# Patient Record
Sex: Female | Born: 1945 | Race: White | Hispanic: No | Marital: Married | Smoking: Never smoker
Health system: Southern US, Community
[De-identification: ages and names within clinical notes are randomized; demographics above are authoritative.]

## PROBLEM LIST (undated history)

## (undated) DIAGNOSIS — K219 Gastro-esophageal reflux disease without esophagitis: Secondary | ICD-10-CM

## (undated) DIAGNOSIS — Z8719 Personal history of other diseases of the digestive system: Secondary | ICD-10-CM

## (undated) DIAGNOSIS — I10 Essential (primary) hypertension: Secondary | ICD-10-CM

## (undated) DIAGNOSIS — E7800 Pure hypercholesterolemia, unspecified: Secondary | ICD-10-CM

## (undated) DIAGNOSIS — M1990 Unspecified osteoarthritis, unspecified site: Secondary | ICD-10-CM

## (undated) HISTORY — DX: Pure hypercholesterolemia, unspecified: E78.00

## (undated) HISTORY — DX: Gastro-esophageal reflux disease without esophagitis: K21.9

## (undated) HISTORY — PX: ESOPHAGOGASTRODUODENOSCOPY ENDOSCOPY: SHX5814

## (undated) HISTORY — DX: Essential (primary) hypertension: I10

## (undated) HISTORY — DX: Unspecified osteoarthritis, unspecified site: M19.90

---

## 1967-06-27 HISTORY — PX: APPENDECTOMY: SHX54

## 1967-06-27 HISTORY — PX: TUBAL LIGATION: SHX77

## 1968-06-26 HISTORY — PX: TONSILECTOMY/ADENOIDECTOMY WITH MYRINGOTOMY: SHX6125

## 1985-06-26 HISTORY — PX: VAGINAL HYSTERECTOMY: SUR661

## 2004-10-19 ENCOUNTER — Ambulatory Visit: Payer: Self-pay | Admitting: Internal Medicine

## 2004-10-28 ENCOUNTER — Ambulatory Visit: Payer: Self-pay

## 2005-10-23 ENCOUNTER — Ambulatory Visit: Payer: Self-pay | Admitting: Internal Medicine

## 2006-11-06 ENCOUNTER — Ambulatory Visit: Payer: Self-pay | Admitting: Internal Medicine

## 2006-11-16 ENCOUNTER — Ambulatory Visit: Payer: Self-pay | Admitting: Internal Medicine

## 2007-11-08 ENCOUNTER — Ambulatory Visit: Payer: Self-pay | Admitting: Internal Medicine

## 2008-04-30 ENCOUNTER — Ambulatory Visit: Payer: Self-pay | Admitting: Gastroenterology

## 2008-11-13 ENCOUNTER — Ambulatory Visit: Payer: Self-pay | Admitting: Internal Medicine

## 2009-03-07 ENCOUNTER — Emergency Department: Payer: Self-pay | Admitting: Internal Medicine

## 2010-06-01 ENCOUNTER — Ambulatory Visit: Payer: Self-pay | Admitting: Internal Medicine

## 2011-05-19 ENCOUNTER — Ambulatory Visit: Payer: Self-pay | Admitting: Internal Medicine

## 2011-08-03 ENCOUNTER — Ambulatory Visit: Payer: Self-pay | Admitting: Internal Medicine

## 2012-05-06 ENCOUNTER — Encounter: Payer: Self-pay | Admitting: Internal Medicine

## 2012-05-06 ENCOUNTER — Ambulatory Visit (INDEPENDENT_AMBULATORY_CARE_PROVIDER_SITE_OTHER): Payer: Medicare Other | Admitting: Internal Medicine

## 2012-05-06 VITALS — BP 144/80 | HR 100 | Temp 98.4°F | Ht 66.0 in | Wt 167.0 lb

## 2012-05-06 DIAGNOSIS — R5383 Other fatigue: Secondary | ICD-10-CM

## 2012-05-06 DIAGNOSIS — M79603 Pain in arm, unspecified: Secondary | ICD-10-CM

## 2012-05-06 DIAGNOSIS — I10 Essential (primary) hypertension: Secondary | ICD-10-CM

## 2012-05-06 DIAGNOSIS — E7800 Pure hypercholesterolemia, unspecified: Secondary | ICD-10-CM

## 2012-05-06 DIAGNOSIS — R5381 Other malaise: Secondary | ICD-10-CM

## 2012-05-06 DIAGNOSIS — M8580 Other specified disorders of bone density and structure, unspecified site: Secondary | ICD-10-CM

## 2012-05-06 DIAGNOSIS — M810 Age-related osteoporosis without current pathological fracture: Secondary | ICD-10-CM | POA: Insufficient documentation

## 2012-05-06 DIAGNOSIS — M949 Disorder of cartilage, unspecified: Secondary | ICD-10-CM

## 2012-05-06 DIAGNOSIS — M79609 Pain in unspecified limb: Secondary | ICD-10-CM

## 2012-05-06 DIAGNOSIS — Z23 Encounter for immunization: Secondary | ICD-10-CM

## 2012-05-06 NOTE — Assessment & Plan Note (Signed)
Blood pressure had been under good control.  Elevated today - in the left arm.  Her outside checks have been under good control.  Have her spot check her pressure and get her back in soon to reassess.  Check metabolic panel with next labs.

## 2012-05-06 NOTE — Assessment & Plan Note (Signed)
Low cholesterol diet and exercise.  Check lipid panel with next labs.  

## 2012-05-06 NOTE — Progress Notes (Addendum)
  Subjective:    Patient ID: Whitney Carr, female    DOB: 06-Jun-1946, 66 y.o.   MRN: [id number]  HPI 66 year old female with past history of hypertension and hypercholesterolemia who comes in today for a scheduled follow up.  She stopped her HCTZ approximately 6 weeks ago.  Dizzy spells resolved.  Blood pressure has been averaging 130s/80.  Denies any chest pain or tightness.  Breathing is stable.  Eating and drinking well.  No acid reflux.    Past Medical History  Diagnosis Date  . Hypercholesterolemia   . Hypertension   . GERD (gastroesophageal reflux disease)   . Degenerative joint disease     Review of Systems Patient denies any headache, lightheadedness or dizziness.  No sinus or allergy symptoms reported. No chest pain, tightness or palpitations.  No increased shortness of breath, cough or congestion.  No acid reflux.  No nausea or vomiting.  No abdominal pain or cramping.  No bowel change, such as diarrhea, constipation, BRBPR or melana.  No urine change.   After checking her blood pressure and finding a variation in her arms, she did report noticing some intermittent pain in her left upper arm.       Objective:   Physical Exam Filed Vitals:   05/06/12 1440  BP: 144/80  Pulse: 100  Temp: 98.4 F (36.9 C)   Blood pressure:  122/78 left arm and 152/78 right arm  66 year old female in no acute distress.   HEENT:  Nares - clear.  OP- without lesions or erythema.  NECK:  Supple, nontender.     HEART:  Appears to be regular. LUNGS:  Without crackles or wheezing audible.  Respirations even and unlabored.   RADIAL PULSE:  Delayed and minimally diminished on the left compared to the right.   ABDOMEN:  Soft, nontender.  No audible abdominal bruit.   EXTREMITIES:  No increased edema to be present.                     Assessment & Plan:  VARYING PRESSURES.  Blood pressure as outlined in each arm.  Will refer to vascular surgery for evaluation and treatment.  Pt reports noticing  intermittent discomfort in her left upper arm.    CARDIOVASCULAR.  Currently asymptomatic.  Continue risk factor modification.    PREVIOUS CAROTID BRUIT.  Carotid ultrasound 05/19/11 revealed no hemodynamically significant stenosis.    HEALTH MAINTENANCE.  Physical 11/29/11.  Is s/p hysterectomy and does not require yearly pap smears.  Colonoscopy 11/09 revealed small polyp that was removed.  Recommended follow up colonoscopy 5 years.  Pneumovax given today.

## 2012-05-06 NOTE — Patient Instructions (Signed)
It was nice seeing you today.  I am glad you have been doing well.  Monitor your blood pressures and let me know if problems.

## 2012-05-06 NOTE — Assessment & Plan Note (Signed)
Bone density 05/27/10 revealed osteopenia.  No significant change.  Continue calcium and vitamin D and weight bearing exercise.    

## 2012-05-08 MED ORDER — PNEUMOCOCCAL VAC POLYVALENT 25 MCG/0.5ML IJ INJ
0.5000 mL | INJECTION | Freq: Once | INTRAMUSCULAR | Status: AC
  Administered 2012-05-08: 0.5 mL via INTRAMUSCULAR

## 2012-05-08 NOTE — Addendum Note (Signed)
Addended by: Marlene Lard on: 05/08/2012 09:43 AM   Modules accepted: Orders

## 2012-05-13 ENCOUNTER — Other Ambulatory Visit (INDEPENDENT_AMBULATORY_CARE_PROVIDER_SITE_OTHER): Payer: Medicare Other

## 2012-05-13 DIAGNOSIS — R5381 Other malaise: Secondary | ICD-10-CM

## 2012-05-13 DIAGNOSIS — R5383 Other fatigue: Secondary | ICD-10-CM

## 2012-05-13 DIAGNOSIS — E7800 Pure hypercholesterolemia, unspecified: Secondary | ICD-10-CM

## 2012-05-13 LAB — COMPREHENSIVE METABOLIC PANEL
AST: 18 U/L (ref 0–37)
Albumin: 4.4 g/dL (ref 3.5–5.2)
Alkaline Phosphatase: 50 U/L (ref 39–117)
Calcium: 9.2 mg/dL (ref 8.4–10.5)
Chloride: 104 mEq/L (ref 96–112)
Glucose, Bld: 93 mg/dL (ref 70–99)
Potassium: 3.7 mEq/L (ref 3.5–5.1)
Sodium: 137 mEq/L (ref 135–145)
Total Protein: 7.8 g/dL (ref 6.0–8.3)

## 2012-05-13 LAB — LIPID PANEL
Cholesterol: 223 mg/dL — ABNORMAL HIGH (ref 0–200)
HDL: 50.4 mg/dL (ref >39.00)
Total CHOL/HDL Ratio: 4
Triglycerides: 118 mg/dL (ref 0.0–149.0)

## 2012-05-13 LAB — CBC WITH DIFFERENTIAL/PLATELET
Eosinophils Relative: 0.7 % (ref 0.0–5.0)
Lymphocytes Relative: 22.1 % (ref 12.0–46.0)
MCV: 90.8 fl (ref 78.0–100.0)
Monocytes Absolute: 0.5 10*3/uL (ref 0.1–1.0)
Neutrophils Relative %: 71.2 % (ref 43.0–77.0)
Platelets: 302 10*3/uL (ref 150.0–400.0)
RBC: 4.75 Mil/uL (ref 3.87–5.11)
WBC: 8.2 10*3/uL (ref 4.5–10.5)

## 2012-05-14 ENCOUNTER — Telehealth: Payer: Self-pay | Admitting: Internal Medicine

## 2012-05-14 NOTE — Telephone Encounter (Signed)
Opened by mistake.

## 2012-05-31 ENCOUNTER — Encounter: Payer: Self-pay | Admitting: *Deleted

## 2012-05-31 DIAGNOSIS — Z86010 Personal history of colonic polyps: Secondary | ICD-10-CM

## 2012-06-03 ENCOUNTER — Ambulatory Visit (INDEPENDENT_AMBULATORY_CARE_PROVIDER_SITE_OTHER): Payer: Medicare Other | Admitting: Internal Medicine

## 2012-06-03 ENCOUNTER — Encounter: Payer: Self-pay | Admitting: Internal Medicine

## 2012-06-03 VITALS — BP 162/76 | HR 85 | Temp 98.2°F | Ht 66.0 in | Wt 168.5 lb

## 2012-06-03 DIAGNOSIS — I10 Essential (primary) hypertension: Secondary | ICD-10-CM

## 2012-06-03 DIAGNOSIS — M2550 Pain in unspecified joint: Secondary | ICD-10-CM

## 2012-06-03 DIAGNOSIS — M2560 Stiffness of unspecified joint, not elsewhere classified: Secondary | ICD-10-CM

## 2012-06-03 MED ORDER — LISINOPRIL 10 MG PO TABS: 10.0000 mg | ORAL_TABLET | Freq: Every day | ORAL | Status: DC

## 2012-06-03 NOTE — Progress Notes (Signed)
  Subjective:    Patient ID: Turner Kunzman, female    DOB: 1946/03/26, 66 y.o.   MRN: [id number]  HPI 66 year old female with past history of hypertension and hypercholesterolemia who comes in today for a scheduled follow up.  Here to follow up regarding her blood pressure.  She has been off the HCTZ for approximately 2 months.  Blood pressure remaining in the 140s/60-70.  Denies any chest pain or tightness.  Breathing is stable.  Eating and drinking well.  No acid reflux.    Past Medical History  Diagnosis Date  . Hypercholesterolemia   . Hypertension   . GERD (gastroesophageal reflux disease)   . Degenerative joint disease     Review of Systems Patient denies any headache, lightheadedness or dizziness.  No sinus or allergy symptoms reported. No chest pain, tightness or palpitations.  No increased shortness of breath, cough or congestion.  No acid reflux.  No nausea or vomiting.  No abdominal pain or cramping.  No bowel change, such as diarrhea, constipation, BRBPR or melana.  No urine change.   She did see Dr Gilda Crease for the varying pressures in each arm.  Is planning for further vascular evaluation at the end of the month.  She does report increased joint aches and pains.  Feels stiff when she first gets up and if she sits for a while.  Once she starts moving around - feels better.       Objective:   Physical Exam  Filed Vitals:   06/03/12 1603  BP: 162/76  Pulse: 85  Temp: 98.2 F (36.8 C)   Blood pressure recheck:  23/58  66 year old female in no acute distress.   HEENT:  Nares - clear.  OP- without lesions or erythema.  NECK:  Supple, nontender.  Right carotid bruit.   HEART:  Appears to be regular. LUNGS:  Without crackles or wheezing audible.  Respirations even and unlabored.   RADIAL PULSE:  Delayed and minimally diminished on the left compared to the right.   ABDOMEN:  Soft, nontender.  No audible abdominal bruit.   EXTREMITIES:  No increased edema to be present.                      Assessment & Plan:  VARYING PRESSURES.  Blood pressures varying in each arm.  Saw vascular surgery for evaluation. Planning to have further vascular evaluation at the end of the month.  Does have the carotid bruit.  Pursue further vascular evaluation as outlined.      CARDIOVASCULAR.  Currently asymptomatic.  Continue risk factor modification.    CAROTID BRUIT.  Carotid ultrasound 05/19/11 revealed no hemodynamically significant stenosis.   JOINT PAINS.  See above.  Will check rheum panel with next labs.    HEALTH MAINTENANCE.  Physical 11/29/11.  Is s/p hysterectomy and does not require yearly pap smears.  Colonoscopy 11/09 revealed small polyp that was removed.  Recommended follow up colonoscopy 5 years.  Pneumovax given last visit.

## 2012-06-03 NOTE — Patient Instructions (Addendum)
It was nice seeing you today.  I am going to start you on Lisinopril 10mg  - one per day.  Spot check your blood pressure.  Let me know if any problems.

## 2012-06-03 NOTE — Assessment & Plan Note (Signed)
Blood pressure elevated as outlined.  Will start Lisinopril 10mg  q day.  Check met b in 10-14 days.  Get her back in soon to reassess.  Have her spot check her pressures.  Let me know if remains elevated.

## 2012-06-23 ENCOUNTER — Telehealth: Payer: Self-pay | Admitting: Internal Medicine

## 2012-06-23 NOTE — Telephone Encounter (Signed)
I reviewed labs from [facility].  Inflammatory markers normal.  Metabolic panel wnl.  Labs ok.

## 2012-06-24 NOTE — Telephone Encounter (Signed)
Left detailed message per patient

## 2012-07-02 ENCOUNTER — Ambulatory Visit: Payer: Self-pay | Admitting: Vascular Surgery

## 2012-07-02 LAB — BUN: BUN: 18 mg/dL (ref 7–18)

## 2012-07-02 LAB — CREATININE, SERUM: Creatinine: 0.61 mg/dL (ref 0.60–1.30)

## 2012-07-12 ENCOUNTER — Encounter: Payer: Self-pay | Admitting: Internal Medicine

## 2012-07-12 ENCOUNTER — Ambulatory Visit (INDEPENDENT_AMBULATORY_CARE_PROVIDER_SITE_OTHER): Payer: Medicare Other | Admitting: Internal Medicine

## 2012-07-12 VITALS — BP 142/82 | HR 77 | Temp 98.5°F | Ht 66.0 in | Wt 168.2 lb

## 2012-07-12 DIAGNOSIS — I10 Essential (primary) hypertension: Secondary | ICD-10-CM

## 2012-07-12 DIAGNOSIS — E7800 Pure hypercholesterolemia, unspecified: Secondary | ICD-10-CM

## 2012-07-12 MED ORDER — LOSARTAN POTASSIUM 25 MG PO TABS: 25.0000 mg | ORAL_TABLET | Freq: Every day | ORAL | Status: DC

## 2012-07-13 ENCOUNTER — Encounter: Payer: Self-pay | Admitting: Internal Medicine

## 2012-07-13 NOTE — Progress Notes (Signed)
  Subjective:    Patient ID: Whitney Carr, female    DOB: [birth year], 67 y.o.   MRN: [id number]  HPI 67 year old female with past history of hypertension and hypercholesterolemia who comes in today for a scheduled follow up.  Here to follow up regarding her blood pressure.  Was started on Lisinopril last visit.  She has noticed since starting - having a dry hacking cough.  Blood pressures have been averaging 130-140 systolic readings.  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  Doing well.  No pain.  No further dizziness.   Denies any chest pain or tightness.  Breathing is stable.  Eating and drinking well.  No acid reflux.    Past Medical History  Diagnosis Date  . Hypercholesterolemia   . Hypertension   . GERD (gastroesophageal reflux disease)   . Degenerative joint disease     Review of Systems Patient denies any headache, lightheadedness or dizziness.  Dizziness resolved.  No sinus or allergy symptoms reported. No chest pain, tightness or palpitations.  No increased shortness of breath or congestion.  Has noticed the dry hacking cough since starting the lisinopril.  No acid reflux.  No nausea or vomiting.  No abdominal pain or cramping.  No bowel change, such as diarrhea, constipation, BRBPR or melana.  No urine change.  Doing well s/p her vascular intervention.  On plavix and aspirin now.        Objective:   Physical Exam  Filed Vitals:   07/12/12 1528  BP: 142/82  Pulse: 77  Temp: 98.5 F (36.9 C)   Blood pressure recheck:  82/73  67 year old female in no acute distress.   HEENT:  Nares - clear.  OP- without lesions or erythema.  NECK:  Supple, nontender.  Right carotid bruit.   HEART:  Appears to be regular. LUNGS:  Without crackles or wheezing audible.  Respirations even and unlabored.   RADIAL PULSE:  Appears to be equal bilaterally now.    ABDOMEN:  Soft, nontender.  No audible abdominal bruit.  She did have an  audible right femoral bruit.  EXTREMITIES:  No increased edema to be present.                     Assessment & Plan:  SUBCLAVIAN STEAL.  S/p vascular intervention/stenting.  On plavix and aspirin now.  Doing well.  Has the femoral bruit.  Discussed with vascular surgery.  They will contact pt regarding setting up an ultrasound to evaluate for possible pseudoaneurysm.       CARDIOVASCULAR.  Currently asymptomatic.  Continue risk factor modification.    CAROTID BRUIT.  Carotid ultrasound 05/19/11 revealed no hemodynamically significant stenosis.   HEALTH MAINTENANCE.  Physical 11/29/11.  Is s/p hysterectomy and does not require yearly pap smears.  Colonoscopy 11/09 revealed small polyp that was removed.  Recommended follow up colonoscopy 5 years.

## 2012-07-13 NOTE — Assessment & Plan Note (Signed)
Blood pressure better.  Is having cough with Lisinopril.  Will d/c lisinopril and start losartan 25mg  q day.  Follow pressures.

## 2012-07-13 NOTE — Assessment & Plan Note (Signed)
Low fat/low cholesterol diet.  Follow lipid profile.    

## 2012-07-15 ENCOUNTER — Encounter: Payer: Self-pay | Admitting: Internal Medicine

## 2012-07-31 ENCOUNTER — Encounter: Payer: Self-pay | Admitting: Internal Medicine

## 2012-08-01 ENCOUNTER — Telehealth: Payer: Self-pay | Admitting: *Deleted

## 2012-08-01 NOTE — Telephone Encounter (Signed)
Called patient to verify that patient has sopped taking lisinopril and is currently taking losartan. Yes the lisinopril was stopped per patient.

## 2012-08-29 ENCOUNTER — Ambulatory Visit: Payer: Medicare Other | Admitting: Internal Medicine

## 2012-09-02 ENCOUNTER — Ambulatory Visit: Payer: Self-pay | Admitting: Internal Medicine

## 2012-09-04 ENCOUNTER — Encounter: Payer: Self-pay | Admitting: Internal Medicine

## 2012-09-04 ENCOUNTER — Ambulatory Visit (INDEPENDENT_AMBULATORY_CARE_PROVIDER_SITE_OTHER): Payer: Medicare Other | Admitting: Internal Medicine

## 2012-09-04 VITALS — BP 130/70 | HR 77 | Temp 98.9°F | Ht 66.0 in | Wt 168.5 lb

## 2012-09-04 DIAGNOSIS — M25562 Pain in left knee: Secondary | ICD-10-CM

## 2012-09-04 DIAGNOSIS — I10 Essential (primary) hypertension: Secondary | ICD-10-CM

## 2012-09-04 MED ORDER — LOSARTAN POTASSIUM 50 MG PO TABS: 50.0000 mg | ORAL_TABLET | Freq: Every day | ORAL | Status: DC

## 2012-09-05 ENCOUNTER — Encounter: Payer: Self-pay | Admitting: Internal Medicine

## 2012-09-05 NOTE — Assessment & Plan Note (Signed)
Had cough with Lisinopril.  Was changed to losartan 25mg  q day last visit.  Pressures still elevated.  Will increase losartan to 50mg  q day.  Follow pressures.  Get her back in soon to reassess.

## 2012-09-05 NOTE — Progress Notes (Signed)
  Subjective:    Patient ID: Whitney Carr, female    DOB: 12/29/1945, 67 y.o.   MRN: [id number]  HPI 67 year old female with past history of hypertension and hypercholesterolemia who comes in today for a scheduled follow up.  Here to follow up regarding her blood pressure.  Was started on Lisinopril.  Had a cough.  See last note.  Was changed to losartan.  Cough has resolved.  Blood pressures have been varying - may run 120s and then will have readings 140-160.  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  Doing well.  No pain.  No further dizziness.   Denies any chest pain or tightness.  Breathing is stable.  Eating and drinking well.  No acid reflux.  She does report persistent problems with her left knee.  She request to see an orthopedist.    Past Medical History  Diagnosis Date  . Hypercholesterolemia   . Hypertension   . GERD (gastroesophageal reflux disease)   . Degenerative joint disease     Review of Systems Patient denies any headache, lightheadedness or dizziness.  Dizziness resolved.  No sinus or allergy symptoms reported. No chest pain, tightness or palpitations.  No increased shortness of breath or congestion.  Cough resolved with stopping the lisinopril.   No acid reflux.  No nausea or vomiting.  No abdominal pain or cramping.  No bowel change, such as diarrhea, constipation, BRBPR or melana.  No urine change.  Doing well s/p her vascular intervention.  On plavix and aspirin now.   She does report persistent knee pain.  Request ortho referral.      Objective:   Physical Exam  Filed Vitals:   09/04/12 0855  BP: 130/70  Pulse: 77  Temp: 98.9 F (37.2 C)   Blood pressure recheck:  138/80 (right arm) and 152/80 (left arm)  67 year old female in no acute distress.   HEENT:  Nares - clear.  OP- without lesions or erythema.  NECK:  Supple, nontender.  Right carotid bruit.   HEART:  Appears to be regular. LUNGS:  Without  crackles or wheezing audible.  Respirations even and unlabored.   RADIAL PULSE:  Appears to be equal bilaterally now.    ABDOMEN:  Soft, nontender.  No audible abdominal bruit.  She did have an audible right femoral bruit.  EXTREMITIES:  No increased edema to be present.                     Assessment & Plan:  SUBCLAVIAN STEAL.  S/p vascular intervention/stenting.  On plavix and aspirin now.  Doing well.  Has follow up planned in 5/14.        CARDIOVASCULAR.  Currently asymptomatic.  Continue risk factor modification.    CAROTID BRUIT.  Carotid ultrasound 05/19/11 revealed no hemodynamically significant stenosis.   KNEE PAIN.  Persistent problem for her.  Unable to take antiinflammatories.  Tylenol as instructed.  She requested ortho referral.  Wants to hold on any further evaluation here (ie xray, therapy, etc).  Wants to see ortho for their opinion.  Will make referral.    HEALTH MAINTENANCE.  Physical 11/29/11.  Is s/p hysterectomy and does not require yearly pap smears.  Colonoscopy 11/09 revealed small polyp that was removed.  Recommended follow up colonoscopy 5 years.

## 2012-09-19 ENCOUNTER — Encounter: Payer: Self-pay | Admitting: Internal Medicine

## 2012-10-22 ENCOUNTER — Encounter: Payer: Self-pay | Admitting: Internal Medicine

## 2012-10-22 ENCOUNTER — Ambulatory Visit (INDEPENDENT_AMBULATORY_CARE_PROVIDER_SITE_OTHER): Payer: Medicare Other | Admitting: Internal Medicine

## 2012-10-22 VITALS — BP 140/70 | HR 98 | Temp 98.5°F | Ht 66.0 in | Wt 171.5 lb

## 2012-10-22 DIAGNOSIS — M8580 Other specified disorders of bone density and structure, unspecified site: Secondary | ICD-10-CM

## 2012-10-22 DIAGNOSIS — M949 Disorder of cartilage, unspecified: Secondary | ICD-10-CM

## 2012-10-22 DIAGNOSIS — I10 Essential (primary) hypertension: Secondary | ICD-10-CM

## 2012-10-22 DIAGNOSIS — E7800 Pure hypercholesterolemia, unspecified: Secondary | ICD-10-CM

## 2012-10-22 NOTE — Assessment & Plan Note (Signed)
Low fat/low cholesterol diet.  Follow lipid profile.    

## 2012-10-22 NOTE — Assessment & Plan Note (Signed)
Bone density 05/27/10 revealed osteopenia.  No significant change.  Continue calcium and vitamin D and weight bearing exercise.

## 2012-10-22 NOTE — Assessment & Plan Note (Signed)
Had cough with Lisinopril.  Was changed to losartan 50mg  q day last visit.  Pressures as outlined.  Will hold on making adjustments in her medication.   Follow pressures.  Check metabolic panel.

## 2012-10-22 NOTE — Progress Notes (Signed)
Subjective:    Patient ID: Whitney Carr, female    DOB: Nov 21, 1945, 67 y.o.   MRN: [id number]  HPI 67 year old female with past history of hypertension and hypercholesterolemia who comes in today for a scheduled follow up.  Here to follow up regarding her blood pressure.  Was started on Lisinopril.  Had a cough.  Was changed to losartan.  Cough resolved.  Blood pressures have been varying - may run 120s and then will have readings 140-150.  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  Doing well.  No pain.  No further dizziness.   Denies any chest pain or tightness.  Breathing is stable.  Eating and drinking well.  No acid reflux.      Past Medical History  Diagnosis Date  . Hypercholesterolemia   . Hypertension   . GERD (gastroesophageal reflux disease)   . Degenerative joint disease     Current Outpatient Prescriptions on File Prior to Visit  Medication Sig Dispense Refill  . aspirin 81 MG tablet Take 81 mg by mouth daily.      . Calcium 600-200 MG-UNIT per tablet Take 1 tablet by mouth 2 (two) times daily.      . cholecalciferol (VITAMIN D) 1000 UNITS tablet Take 1,000 Units by mouth daily.      . clopidogrel (PLAVIX) 75 MG tablet Take 75 mg by mouth daily.      Marland Kitchen losartan (COZAAR) 50 MG tablet Take 1 tablet (50 mg total) by mouth daily.  30 tablet  3   No current facility-administered medications on file prior to visit.    Review of Systems Patient denies any headache, lightheadedness or dizziness.  No significant sinus or allergy symptoms.  Minimal allergy issues.  She is controlling.   No chest pain, tightness or palpitations.  No increased shortness of breath or congestion.  Cough resolved with stopping the lisinopril.   No acid reflux.  No nausea or vomiting.  No abdominal pain or cramping.  No bowel change, such as diarrhea, constipation, BRBPR or melana.  No urine change.  Doing well s/p her vascular intervention.  On plavix  and aspirin now.   Blood pressure as outlined.  Overall she feels she is doing well.      Objective:   Physical Exam  Filed Vitals:   10/22/12 0808  BP: 140/70  Pulse: 98  Temp: 98.5 F (36.9 C)   Blood pressure recheck:  138/64 (left arm) and 138/68 (right arm)  67 year old female in no acute distress.   HEENT:  Nares - clear.  OP- without lesions or erythema.  NECK:  Supple, nontender.  Right carotid bruit.   HEART:  Appears to be regular. LUNGS:  Without crackles or wheezing audible.  Respirations even and unlabored.   RADIAL PULSE:  Appears to be equal bilaterally now.    ABDOMEN:  Soft, nontender.  No audible abdominal bruit.  EXTREMITIES:  No increased edema to be present.                     Assessment & Plan:  SUBCLAVIAN STEAL.  S/p vascular intervention/stenting.  On plavix and aspirin now.  Doing well.  Has follow up planned in 5/14.        CARDIOVASCULAR.  Currently asymptomatic.  Continue risk factor modification.    CAROTID BRUIT.  Carotid ultrasound 05/19/11 revealed no hemodynamically significant stenosis.   KNEE PAIN.  Was referred to ortho last  visit.  Did not report as a problem today.   HEALTH MAINTENANCE.  Physical 11/29/11.  Is s/p hysterectomy and does not require yearly pap smears.  Colonoscopy 11/09 revealed small polyp that was removed.  Recommended follow up colonoscopy 5 years.

## 2012-11-14 ENCOUNTER — Other Ambulatory Visit: Payer: Self-pay | Admitting: Internal Medicine

## 2012-11-19 LAB — COMPREHENSIVE METABOLIC PANEL
Albumin: 4.5 g/dL (ref 3.6–4.8)
BUN: 16 mg/dL (ref 8–27)
CO2: 23 mmol/L (ref 19–28)
Calcium: 9.6 mg/dL (ref 8.6–10.2)
Creatinine, Ser: 0.72 mg/dL (ref 0.57–1.00)
Globulin, Total: 2.6 g/dL (ref 1.5–4.5)

## 2012-11-19 LAB — CBC/DIFF AMBIGUOUS DEFAULT
Eos: 1 % (ref 0–5)
Hemoglobin: 13.6 g/dL (ref 11.1–15.9)
Immature Grans (Abs): 0 10*3/uL (ref 0.0–0.1)
Immature Granulocytes: 0 % (ref 0–2)
Lymphocytes Absolute: 1.6 10*3/uL (ref 0.7–3.1)
MCV: 90 fL (ref 79–97)
Monocytes: 7 % (ref 4–12)
Platelets: 337 10*3/uL (ref 155–379)
RBC: 4.36 x10E6/uL (ref 3.77–5.28)
WBC: 5.9 10*3/uL (ref 3.4–10.8)

## 2012-11-19 LAB — LIPID PANEL W/O CHOL/HDL RATIO
HDL: 59 mg/dL (ref >39)
LDL Calculated: 124 mg/dL — ABNORMAL HIGH (ref 0–99)
Triglycerides: 82 mg/dL (ref 0–149)

## 2012-11-19 LAB — TSH: TSH: 1.76 u[IU]/mL (ref 0.450–4.500)

## 2012-11-20 ENCOUNTER — Telehealth: Payer: Self-pay | Admitting: Internal Medicine

## 2012-11-20 NOTE — Telephone Encounter (Signed)
Lab results received, & in your folder

## 2012-11-20 NOTE — Telephone Encounter (Signed)
See lab results.  Scanned in.

## 2012-11-20 NOTE — Telephone Encounter (Signed)
Have you seen any recent labs on this pt.  I called her to let her know about her husbands labs and she informed me she had her labs drawn last week (at [facility]).  If we do not have, can we request.  Thanks.

## 2012-11-20 NOTE — Telephone Encounter (Signed)
Requested results from [facility]

## 2012-11-21 ENCOUNTER — Encounter: Payer: Self-pay | Admitting: *Deleted

## 2012-12-04 ENCOUNTER — Other Ambulatory Visit: Payer: Self-pay | Admitting: *Deleted

## 2012-12-04 MED ORDER — LOSARTAN POTASSIUM 50 MG PO TABS: 50.0000 mg | ORAL_TABLET | Freq: Every day | ORAL | Status: DC

## 2012-12-26 ENCOUNTER — Other Ambulatory Visit: Payer: Self-pay | Admitting: Internal Medicine

## 2012-12-26 MED ORDER — LOSARTAN POTASSIUM 50 MG PO TABS: 50.0000 mg | ORAL_TABLET | Freq: Every day | ORAL | Status: DC

## 2012-12-26 NOTE — Progress Notes (Signed)
Refilled losartan #90 with 3 refills.

## 2013-01-03 ENCOUNTER — Ambulatory Visit (INDEPENDENT_AMBULATORY_CARE_PROVIDER_SITE_OTHER): Payer: Medicare Other | Admitting: Internal Medicine

## 2013-01-03 ENCOUNTER — Encounter: Payer: Self-pay | Admitting: Internal Medicine

## 2013-01-03 VITALS — BP 130/70 | HR 74 | Temp 98.3°F | Ht 66.0 in | Wt 177.2 lb

## 2013-01-03 DIAGNOSIS — M949 Disorder of cartilage, unspecified: Secondary | ICD-10-CM

## 2013-01-03 DIAGNOSIS — M8580 Other specified disorders of bone density and structure, unspecified site: Secondary | ICD-10-CM

## 2013-01-03 DIAGNOSIS — I10 Essential (primary) hypertension: Secondary | ICD-10-CM

## 2013-01-03 DIAGNOSIS — E7800 Pure hypercholesterolemia, unspecified: Secondary | ICD-10-CM

## 2013-01-03 MED ORDER — CLOPIDOGREL BISULFATE 75 MG PO TABS: 75.0000 mg | ORAL_TABLET | Freq: Every day | ORAL | Status: DC

## 2013-01-05 ENCOUNTER — Encounter: Payer: Self-pay | Admitting: Internal Medicine

## 2013-01-05 NOTE — Assessment & Plan Note (Signed)
Low fat/low cholesterol diet.  Follow lipid profile.    

## 2013-01-05 NOTE — Assessment & Plan Note (Signed)
Had cough with Lisinopril.  Was changed to losartan 50mg  q day last visit.  Pressures as outlined.  Will hold on making adjustments in her medication.   Follow pressures.  Check metabolic panel.

## 2013-01-05 NOTE — Progress Notes (Signed)
Subjective:    Patient ID: Whitney Carr, female    DOB: Jan 18, 1946, 67 y.o.   MRN: [id number]  HPI 67 year old female with past history of hypertension and hypercholesterolemia who comes in today to follow up on these issues as well as for a complete physical exam.   Here to follow up regarding her blood pressure.  Was started on Lisinopril.  Had a cough.  Was changed to losartan.  Cough resolved. Blood pressures have been varying - may run 120s and then will have readings 140-150.  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  Doing well.  No pain.  No further dizziness.   Denies any chest pain or tightness.  Breathing is stable.  Eating and drinking well.  No acid reflux.  Taking plavix.  Legs feel better on plavix.  Wants to continue.  Recent trip.  Had some leg swelling.  Better now.      Past Medical History  Diagnosis Date  . Hypercholesterolemia   . Hypertension   . GERD (gastroesophageal reflux disease)   . Degenerative joint disease     Current Outpatient Prescriptions on File Prior to Visit  Medication Sig Dispense Refill  . aspirin 81 MG tablet Take 81 mg by mouth daily.      . Calcium 600-200 MG-UNIT per tablet Take 1 tablet by mouth 2 (two) times daily.      . cholecalciferol (VITAMIN D) 1000 UNITS tablet Take 1,000 Units by mouth daily.      Marland Kitchen losartan (COZAAR) 50 MG tablet Take 1 tablet (50 mg total) by mouth daily.  90 tablet  3   No current facility-administered medications on file prior to visit.    Review of Systems Patient denies any headache, lightheadedness or dizziness.  No significant sinus or allergy symptoms.  No chest pain, tightness or palpitations.  No increased shortness of breath or congestion. Cough resolved with stopping the lisinopril.   No acid reflux.  No nausea or vomiting.  No abdominal pain or cramping.  No bowel change, such as diarrhea, constipation, BRBPR or melana.  No urine change.  Doing well  s/p her vascular intervention.  Feels better on plavix.  Wants to continue.  Blood pressure as outlined.  Overall she feels she is doing well.  Swelling better.      Objective:   Physical Exam  Filed Vitals:   01/03/13 1321  BP: 130/70  Pulse: 74  Temp: 98.3 F (36.8 C)   Blood pressure recheck:  138/70, pulse 77  67 year old female in no acute distress.   HEENT:  Nares- clear.  Oropharynx - without lesions. NECK:  Supple.  Nontender.  No audible bruit.  HEART:  Appears to be regular. LUNGS:  No crackles or wheezing audible.  Respirations even and unlabored.  RADIAL PULSE:  Equal bilaterally.    BREASTS:  No nipple discharge or nipple retraction present.  Could not appreciate any distinct nodules or axillary adenopathy.  ABDOMEN:  Soft, nontender.  Bowel sounds present and normal.  No audible abdominal bruit.  GU:  Not performed.     EXTREMITIES:  No increased edema present.  DP pulses palpable and equal bilaterally.             Assessment & Plan:  SUBCLAVIAN STEAL.  S/p vascular intervention/stenting.  On plavix and aspirin now.  Doing well.  Wants to continue on plavix.       CARDIOVASCULAR.  Currently asymptomatic.  Continue risk factor modification.    CAROTID BRUIT.  Carotid ultrasound 05/19/11 revealed no hemodynamically significant stenosis.   KNEE PAIN.  Did not report as a problem today.   HEALTH MAINTENANCE.  Physical today.  Is s/p hysterectomy and does not require yearly pap smears.  Colonoscopy 11/09 revealed small polyp that was removed.  Recommended follow up colonoscopy 5 years.   Due this year.

## 2013-01-05 NOTE — Assessment & Plan Note (Signed)
Bone density 05/27/10 revealed osteopenia.  No significant change.  Continue calcium and vitamin D and weight bearing exercise.

## 2013-03-26 ENCOUNTER — Encounter: Payer: Self-pay | Admitting: *Deleted

## 2013-04-07 ENCOUNTER — Ambulatory Visit (INDEPENDENT_AMBULATORY_CARE_PROVIDER_SITE_OTHER): Payer: Medicare Other | Admitting: Internal Medicine

## 2013-04-07 ENCOUNTER — Encounter: Payer: Self-pay | Admitting: Internal Medicine

## 2013-04-07 VITALS — BP 142/80 | HR 82 | Temp 98.4°F | Ht 66.0 in | Wt 179.5 lb

## 2013-04-07 DIAGNOSIS — M8580 Other specified disorders of bone density and structure, unspecified site: Secondary | ICD-10-CM

## 2013-04-07 DIAGNOSIS — E7800 Pure hypercholesterolemia, unspecified: Secondary | ICD-10-CM

## 2013-04-07 DIAGNOSIS — Z1211 Encounter for screening for malignant neoplasm of colon: Secondary | ICD-10-CM

## 2013-04-07 DIAGNOSIS — M899 Disorder of bone, unspecified: Secondary | ICD-10-CM

## 2013-04-07 DIAGNOSIS — I10 Essential (primary) hypertension: Secondary | ICD-10-CM

## 2013-04-07 NOTE — Progress Notes (Signed)
Subjective:    Patient ID: Whitney Carr, female    DOB: 04-May-1946, 67 y.o.   MRN: [id number]  HPI 67 year old female with past history of hypertension and hypercholesterolemia who comes in today for a scheduled follow up.   Here to follow up regarding her blood pressure.  Was started on Lisinopril.  Had a cough.  Was changed to losartan.  Cough resolved. Blood pressures had been varying - may run 120s and then will have readings 140-150.  Lately, she reports mostly 120-130 readings.  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  Doing well.  No pain.  No further dizziness.   Denies any chest pain or tightness.  Breathing is stable.  Eating and drinking well.  No acid reflux.  Taking plavix.  Legs feel better on plavix.  Wants to continue.      Past Medical History  Diagnosis Date  . Hypercholesterolemia   . Hypertension   . GERD (gastroesophageal reflux disease)   . Degenerative joint disease     Current Outpatient Prescriptions on File Prior to Visit  Medication Sig Dispense Refill  . aspirin 81 MG tablet Take 81 mg by mouth daily.      . Calcium 600-200 MG-UNIT per tablet Take 1 tablet by mouth 2 (two) times daily.      . cholecalciferol (VITAMIN D) 1000 UNITS tablet Take 1,000 Units by mouth daily.      Marland Kitchen losartan (COZAAR) 50 MG tablet Take 1 tablet (50 mg total) by mouth daily.  90 tablet  3   No current facility-administered medications on file prior to visit.    Review of Systems Patient denies any headache, lightheadedness or dizziness.  No significant sinus or allergy symptoms.  No chest pain, tightness or palpitations.  No increased shortness of breath or congestion. Cough resolved with stopping the lisinopril.   No acid reflux.  No nausea or vomiting.  No abdominal pain or cramping.  No bowel change, such as diarrhea, constipation, BRBPR or melana.  No urine change.  Doing well s/p her vascular intervention.  Feels better on  plavix.  Wants to continue.  Blood pressure as outlined.  Overall she feels she is doing well.       Objective:   Physical Exam  Filed Vitals:   04/07/13 0801  BP: 142/80  Pulse: 82  Temp: 98.4 F (36.9 C)   Blood pressure recheck:  136/74 right and 136/78 left, pulse 59  67 year old female in no acute distress.   HEENT:  Nares- clear.  Oropharynx - without lesions. NECK:  Supple.  Nontender.  No audible bruit.  HEART:  Appears to be regular. LUNGS:  No crackles or wheezing audible.  Respirations even and unlabored.  RADIAL PULSE:  Equal bilaterally.  ABDOMEN:  Soft, nontender.  Bowel sounds present and normal.  No audible abdominal bruit.      EXTREMITIES:  No increased edema present.  DP pulses palpable and equal bilaterally.             Assessment & Plan:  SUBCLAVIAN STEAL.  S/p vascular intervention/stenting.  On plavix and aspirin now.  Doing well.  Wants to continue on plavix.       CARDIOVASCULAR.  Currently asymptomatic.  Continue risk factor modification.    CAROTID BRUIT.  Carotid ultrasound 05/19/11 revealed no hemodynamically significant stenosis.   KNEE PAIN.  Did not report as a problem today.   HEALTH MAINTENANCE.  Physical last  visit.  Is s/p hysterectomy and does not require yearly pap smears.  Colonoscopy 11/09 revealed small polyp that was removed.  Recommended follow up colonoscopy 5 years.   Due this year.  Order placed for GI evaluation.

## 2013-04-07 NOTE — Assessment & Plan Note (Signed)
Had cough with Lisinopril.  Was changed to losartan 50mg  q day last visit.  Pressures as outlined.  Will hold on making adjustments in her medication.   Follow pressures.  Check metabolic panel.

## 2013-04-07 NOTE — Assessment & Plan Note (Signed)
Low fat/low cholesterol diet.  Follow lipid profile.    

## 2013-04-07 NOTE — Assessment & Plan Note (Signed)
Bone density 05/27/10 revealed osteopenia.  No significant change.  Continue vitamin D and weight bearing exercise.   

## 2013-04-29 ENCOUNTER — Encounter: Payer: Self-pay | Admitting: Internal Medicine

## 2013-06-17 ENCOUNTER — Ambulatory Visit: Payer: Self-pay | Admitting: Gastroenterology

## 2013-06-17 LAB — HM COLONOSCOPY: HM Colonoscopy: 2

## 2013-06-18 LAB — PATHOLOGY REPORT

## 2013-08-07 ENCOUNTER — Ambulatory Visit (INDEPENDENT_AMBULATORY_CARE_PROVIDER_SITE_OTHER): Payer: Medicare HMO | Admitting: Internal Medicine

## 2013-08-07 ENCOUNTER — Other Ambulatory Visit: Payer: Self-pay | Admitting: Internal Medicine

## 2013-08-07 ENCOUNTER — Encounter: Payer: Self-pay | Admitting: Internal Medicine

## 2013-08-07 VITALS — BP 130/80 | HR 81 | Temp 98.4°F | Ht 66.0 in | Wt 177.5 lb

## 2013-08-07 DIAGNOSIS — M949 Disorder of cartilage, unspecified: Secondary | ICD-10-CM

## 2013-08-07 DIAGNOSIS — Z1239 Encounter for other screening for malignant neoplasm of breast: Secondary | ICD-10-CM

## 2013-08-07 DIAGNOSIS — M899 Disorder of bone, unspecified: Secondary | ICD-10-CM

## 2013-08-07 DIAGNOSIS — J3489 Other specified disorders of nose and nasal sinuses: Secondary | ICD-10-CM

## 2013-08-07 DIAGNOSIS — M8580 Other specified disorders of bone density and structure, unspecified site: Secondary | ICD-10-CM

## 2013-08-07 DIAGNOSIS — I10 Essential (primary) hypertension: Secondary | ICD-10-CM

## 2013-08-07 DIAGNOSIS — E7800 Pure hypercholesterolemia, unspecified: Secondary | ICD-10-CM

## 2013-08-07 NOTE — Progress Notes (Signed)
Pre-visit discussion using our clinic review tool. No additional management support is needed unless otherwise documented below in the visit note.  

## 2013-08-08 LAB — LIPID PANEL W/O CHOL/HDL RATIO
Cholesterol, Total: 216 mg/dL — ABNORMAL HIGH (ref 100–199)
HDL: 52 mg/dL (ref >39)
LDL Calculated: 129 mg/dL — ABNORMAL HIGH (ref 0–99)
Triglycerides: 173 mg/dL — ABNORMAL HIGH (ref 0–149)
VLDL Cholesterol Cal: 35 mg/dL (ref 5–40)

## 2013-08-08 LAB — COMPREHENSIVE METABOLIC PANEL
ALT: 15 IU/L (ref 0–32)
AST: 17 IU/L (ref 0–40)
Albumin/Globulin Ratio: 1.7 (ref 1.1–2.5)
Albumin: 4.7 g/dL (ref 3.6–4.8)
Alkaline Phosphatase: 59 IU/L (ref 39–117)
BILIRUBIN TOTAL: 0.4 mg/dL (ref 0.0–1.2)
BUN/Creatinine Ratio: 14 (ref 11–26)
BUN: 13 mg/dL (ref 8–27)
CALCIUM: 10 mg/dL (ref 8.7–10.3)
CHLORIDE: 100 mmol/L (ref 97–108)
CO2: 23 mmol/L (ref 18–29)
Creatinine, Ser: 0.9 mg/dL (ref 0.57–1.00)
GFR calc Af Amer: 77 mL/min/{1.73_m2} (ref >59)
GFR, EST NON AFRICAN AMERICAN: 66 mL/min/{1.73_m2} (ref >59)
Globulin, Total: 2.7 g/dL (ref 1.5–4.5)
Glucose: 109 mg/dL — ABNORMAL HIGH (ref 65–99)
POTASSIUM: 4.5 mmol/L (ref 3.5–5.2)
SODIUM: 140 mmol/L (ref 134–144)
Total Protein: 7.4 g/dL (ref 6.0–8.5)

## 2013-08-10 ENCOUNTER — Encounter: Payer: Self-pay | Admitting: Internal Medicine

## 2013-08-10 DIAGNOSIS — J3489 Other specified disorders of nose and nasal sinuses: Secondary | ICD-10-CM | POA: Insufficient documentation

## 2013-08-10 NOTE — Assessment & Plan Note (Signed)
Refer to dermatology for evaluation.  Sees Dr Phillip Heal.

## 2013-08-10 NOTE — Assessment & Plan Note (Signed)
Bone density 05/27/10 revealed osteopenia.  No significant change.  Continue vitamin D and weight bearing exercise.

## 2013-08-10 NOTE — Assessment & Plan Note (Signed)
Had cough with Lisinopril.  Was changed to losartan 50mg  q day last visit.  Pressures as outlined.  Will hold on making adjustments in her medication.   Follow pressures.  Follow metabolic panel.

## 2013-08-10 NOTE — Assessment & Plan Note (Signed)
Low fat/low cholesterol diet.  Follow lipid profile.    

## 2013-08-10 NOTE — Progress Notes (Signed)
  Subjective:    Patient ID: Whitney Carr, female    DOB: 12/05/1945, 68 y.o.   MRN: [id number]  HPI 68 year old female with past history of hypertension and hypercholesterolemia who comes in today for a scheduled follow up.   Here to follow up regarding her blood pressure.  Was started on Lisinopril.  Had a cough.  Was changed to losartan.  Cough resolved. Blood pressures doing better.  Lately, she reports mostly 120-130 readings.  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  Doing well.  No pain.  No further dizziness.   Denies any chest pain or tightness.  Breathing is stable.  Eating and drinking well.  No acid reflux.  Taking plavix.  Legs feel better on plavix.  Wants to continue.      Past Medical History  Diagnosis Date  . Hypercholesterolemia   . Hypertension   . GERD (gastroesophageal reflux disease)   . Degenerative joint disease     Current Outpatient Prescriptions on File Prior to Visit  Medication Sig Dispense Refill  . Calcium 600-200 MG-UNIT per tablet Take 1 tablet by mouth 2 (two) times daily.      . cholecalciferol (VITAMIN D) 1000 UNITS tablet Take 1,000 Units by mouth daily.      Marland Kitchen losartan (COZAAR) 50 MG tablet Take 1 tablet (50 mg total) by mouth daily.  90 tablet  3   No current facility-administered medications on file prior to visit.    Review of Systems Patient denies any headache, lightheadedness or dizziness.  No significant sinus or allergy symptoms.  No chest pain, tightness or palpitations.  No increased shortness of breath or congestion. Cough resolved with stopping the lisinopril.   No acid reflux.  No nausea or vomiting.  No abdominal pain or cramping.  No bowel change, such as diarrhea, constipation, BRBPR or melana.  No urine change.  Doing well s/p her vascular intervention.  Feels better on plavix.  Wants to continue.  Blood pressure as outlined.  Overall she feels she is doing well.       Objective:    Physical Exam  Filed Vitals:   08/07/13 0933  BP: 130/80  Pulse: 81  Temp: 98.4 F (36.9 C)   Blood pressure recheck:  79/32    68 year old female in no acute distress.   HEENT:  Nares- clear.  Oropharynx - without lesions. NECK:  Supple.  Nontender.  No audible bruit.  HEART:  Appears to be regular. LUNGS:  No crackles or wheezing audible.  Respirations even and unlabored.  RADIAL PULSE:  Equal bilaterally.  ABDOMEN:  Soft, nontender.  Bowel sounds present and normal.  No audible abdominal bruit.      EXTREMITIES:  No increased edema present.  DP pulses palpable and equal bilaterally.             Assessment & Plan:  SUBCLAVIAN STEAL.  S/p vascular intervention/stenting.  On plavix and aspirin now.  Doing well.  Wants to continue on plavix.       CARDIOVASCULAR.  Currently asymptomatic.  Continue risk factor modification.    CAROTID BRUIT.  Followed by vascular surgery.    HEALTH MAINTENANCE.  Is s/p hysterectomy and does not require yearly pap smears.  Colonoscopy 11/09 revealed small polyp that was removed.  Recommended follow up colonoscopy 5 years.   Had f/u colonoscopy 06/16/13.  Recommend f/u five years.  Mammogram 09/02/12 - Birads II.

## 2013-08-13 ENCOUNTER — Other Ambulatory Visit: Payer: Self-pay | Admitting: Internal Medicine

## 2013-08-13 DIAGNOSIS — R739 Hyperglycemia, unspecified: Secondary | ICD-10-CM

## 2013-08-13 NOTE — Progress Notes (Signed)
Orders placed for f/u labs.  

## 2013-08-14 ENCOUNTER — Telehealth: Payer: Self-pay | Admitting: *Deleted

## 2013-08-14 ENCOUNTER — Encounter: Payer: Self-pay | Admitting: *Deleted

## 2013-08-14 NOTE — Telephone Encounter (Signed)
Mailed lab results, letter, & [facility] lipid diet info to patient

## 2013-08-18 ENCOUNTER — Encounter: Payer: Self-pay | Admitting: *Deleted

## 2013-08-24 DIAGNOSIS — Z86010 Personal history of colonic polyps: Secondary | ICD-10-CM | POA: Insufficient documentation

## 2013-09-03 ENCOUNTER — Ambulatory Visit: Payer: Self-pay | Admitting: Internal Medicine

## 2013-09-03 LAB — HM MAMMOGRAPHY: HM MAMMO: NEGATIVE

## 2013-09-04 ENCOUNTER — Encounter: Payer: Self-pay | Admitting: Internal Medicine

## 2013-09-09 ENCOUNTER — Other Ambulatory Visit (INDEPENDENT_AMBULATORY_CARE_PROVIDER_SITE_OTHER): Payer: Commercial Managed Care - HMO

## 2013-09-09 ENCOUNTER — Encounter: Payer: Self-pay | Admitting: *Deleted

## 2013-09-09 DIAGNOSIS — R739 Hyperglycemia, unspecified: Secondary | ICD-10-CM

## 2013-09-09 DIAGNOSIS — R7309 Other abnormal glucose: Secondary | ICD-10-CM

## 2013-09-09 LAB — HEMOGLOBIN A1C: HEMOGLOBIN A1C: 5.9 % (ref 4.6–6.5)

## 2013-09-10 LAB — GLUCOSE, FASTING: GLUCOSE, FASTING: 75 mg/dL (ref 70–99)

## 2013-09-26 ENCOUNTER — Other Ambulatory Visit: Payer: Self-pay | Admitting: Internal Medicine

## 2013-10-09 ENCOUNTER — Encounter: Payer: Self-pay | Admitting: Internal Medicine

## 2014-01-07 ENCOUNTER — Encounter: Payer: Medicare HMO | Admitting: Internal Medicine

## 2014-01-20 ENCOUNTER — Encounter: Payer: Self-pay | Admitting: Internal Medicine

## 2014-01-20 ENCOUNTER — Ambulatory Visit (INDEPENDENT_AMBULATORY_CARE_PROVIDER_SITE_OTHER): Payer: Commercial Managed Care - HMO | Admitting: Internal Medicine

## 2014-01-20 VITALS — BP 136/68 | HR 84 | Temp 98.6°F | Ht 65.0 in | Wt 167.5 lb

## 2014-01-20 DIAGNOSIS — Z86010 Personal history of colonic polyps: Secondary | ICD-10-CM

## 2014-01-20 DIAGNOSIS — M949 Disorder of cartilage, unspecified: Secondary | ICD-10-CM

## 2014-01-20 DIAGNOSIS — R7309 Other abnormal glucose: Secondary | ICD-10-CM

## 2014-01-20 DIAGNOSIS — E7800 Pure hypercholesterolemia, unspecified: Secondary | ICD-10-CM

## 2014-01-20 DIAGNOSIS — I10 Essential (primary) hypertension: Secondary | ICD-10-CM

## 2014-01-20 DIAGNOSIS — M899 Disorder of bone, unspecified: Secondary | ICD-10-CM

## 2014-01-20 DIAGNOSIS — Z23 Encounter for immunization: Secondary | ICD-10-CM

## 2014-01-20 DIAGNOSIS — M79605 Pain in left leg: Secondary | ICD-10-CM

## 2014-01-20 DIAGNOSIS — R0989 Other specified symptoms and signs involving the circulatory and respiratory systems: Secondary | ICD-10-CM

## 2014-01-20 DIAGNOSIS — M79604 Pain in right leg: Secondary | ICD-10-CM

## 2014-01-20 DIAGNOSIS — M8580 Other specified disorders of bone density and structure, unspecified site: Secondary | ICD-10-CM

## 2014-01-20 DIAGNOSIS — M79609 Pain in unspecified limb: Secondary | ICD-10-CM

## 2014-01-20 DIAGNOSIS — R739 Hyperglycemia, unspecified: Secondary | ICD-10-CM

## 2014-01-20 NOTE — Progress Notes (Signed)
Pre visit review using our clinic review tool, if applicable. No additional management support is needed unless otherwise documented below in the visit note. 

## 2014-01-25 ENCOUNTER — Encounter: Payer: Self-pay | Admitting: Internal Medicine

## 2014-01-25 DIAGNOSIS — M79605 Pain in left leg: Secondary | ICD-10-CM

## 2014-01-25 DIAGNOSIS — R0989 Other specified symptoms and signs involving the circulatory and respiratory systems: Secondary | ICD-10-CM | POA: Insufficient documentation

## 2014-01-25 DIAGNOSIS — M79604 Pain in right leg: Secondary | ICD-10-CM | POA: Insufficient documentation

## 2014-01-25 DIAGNOSIS — I779 Disorder of arteries and arterioles, unspecified: Secondary | ICD-10-CM | POA: Insufficient documentation

## 2014-01-25 NOTE — Assessment & Plan Note (Signed)
Has been followed by vascular surgery.  States needs referral for f/u.

## 2014-01-25 NOTE — Assessment & Plan Note (Signed)
Bone density 05/27/10 revealed osteopenia.  No significant change.  Continue vitamin D and weight bearing exercise.  Needs f/u mammogram.

## 2014-01-25 NOTE — Assessment & Plan Note (Signed)
Colonoscopy as outlined.  Last colonoscopy 06/17/13.  Recommended f/u colonoscopy in five years.

## 2014-01-25 NOTE — Assessment & Plan Note (Signed)
Increased pain with standing at work.  Compression hose.  Follow.

## 2014-01-25 NOTE — Assessment & Plan Note (Signed)
Blood pressure as outlined.  Feels may be a little higher with increased stress at work.  Have her spot check her pressure.  Follow.  Follow metabolic panel.

## 2014-01-25 NOTE — Assessment & Plan Note (Signed)
Low fat/low cholesterol diet.  Follow lipid profile.

## 2014-01-25 NOTE — Progress Notes (Signed)
Subjective:    Patient ID: Whitney Carr, female    DOB: Feb 07, 1946, 68 y.o.   MRN: [id number]  HPI 68 year old female with past history of hypertension and hypercholesterolemia who comes in today to follow up on these issues as well as for a complete physical exam.  Blood pressures had been doing better.   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  Doing well.  No pain.  No further dizziness.   Denies any chest pain or tightness.  Breathing is stable.  Eating and drinking well.  No acid reflux.  Taking plavix.  Legs feel better on plavix.  Wants to continue. Back at work.  Some increased stress related to this.     Past Medical History  Diagnosis Date  . Hypercholesterolemia   . Hypertension   . GERD (gastroesophageal reflux disease)   . Degenerative joint disease     Current Outpatient Prescriptions on File Prior to Visit  Medication Sig Dispense Refill  . Calcium 600-200 MG-UNIT per tablet Take 1 tablet by mouth 2 (two) times daily.      . cholecalciferol (VITAMIN D) 1000 UNITS tablet Take 1,000 Units by mouth daily.      . clopidogrel (PLAVIX) 75 MG tablet Take 75 mg by mouth daily with breakfast.      . losartan (COZAAR) 50 MG tablet TAKE 1 TABLET BY MOUTH ONCE DAILY.  90 tablet  1   No current facility-administered medications on file prior to visit.    Review of Systems Patient denies any headache, lightheadedness or dizziness.  No significant sinus or allergy symptoms.  No chest pain, tightness or palpitations.  No increased shortness of breath or congestion. Cough resolved with stopping the lisinopril.   No acid reflux.  No nausea or vomiting.  No abdominal pain or cramping.  No bowel change, such as diarrhea, constipation, BRBPR or melana.  No urine change.  Doing well s/p her vascular intervention.  Feels better on plavix.  Wants to continue. Back at work. Since standing more, has noticed some increased pain in her legs.  We discussed wearing  compression hose.  Increased stress related to her work.         Objective:   Physical Exam  Filed Vitals:   01/20/14 1342  BP: 136/68  Pulse: 84  Temp: 98.6 F (72 C)   68 year old female in no acute distress.   HEENT:  Nares- clear.  Oropharynx - without lesions. NECK:  Supple.  Nontender.    HEART:  Appears to be regular. LUNGS:  No crackles or wheezing audible.  Respirations even and unlabored.  RADIAL PULSE:  Equal bilaterally.    BREASTS:  No nipple discharge or nipple retraction present.  Could not appreciate any distinct nodules or axillary adenopathy.  ABDOMEN:  Soft, nontender.  Bowel sounds present and normal.  No audible abdominal bruit.  GU:  Not performed.    EXTREMITIES:  No increased edema present.  DP pulses palpable and equal bilaterally.             Assessment & Plan:  SUBCLAVIAN STEAL.  S/p vascular intervention/stenting.  On plavix and aspirin now.  Doing well.  Wants to continue on plavix.  Continue to follow up with vascular surgery.      CARDIOVASCULAR.  Currently asymptomatic.  Continue risk factor modification.    CAROTID BRUIT.  Followed by vascular surgery.    HEALTH MAINTENANCE.  Is s/p hysterectomy and does not  require yearly pap smears.  Physical today (01/20/14).   Colonoscopy 11/09 revealed small polyp that was removed.  Recommended follow up colonoscopy 5 years.   Had f/u colonoscopy 06/16/13.  Recommend f/u five years.  Mammogram 09/04/13 - Birads I.

## 2014-03-06 ENCOUNTER — Other Ambulatory Visit (INDEPENDENT_AMBULATORY_CARE_PROVIDER_SITE_OTHER): Payer: Commercial Managed Care - HMO

## 2014-03-06 DIAGNOSIS — M899 Disorder of bone, unspecified: Secondary | ICD-10-CM

## 2014-03-06 DIAGNOSIS — E7800 Pure hypercholesterolemia, unspecified: Secondary | ICD-10-CM

## 2014-03-06 DIAGNOSIS — M949 Disorder of cartilage, unspecified: Secondary | ICD-10-CM

## 2014-03-06 DIAGNOSIS — I10 Essential (primary) hypertension: Secondary | ICD-10-CM

## 2014-03-06 DIAGNOSIS — R739 Hyperglycemia, unspecified: Secondary | ICD-10-CM

## 2014-03-06 DIAGNOSIS — M8580 Other specified disorders of bone density and structure, unspecified site: Secondary | ICD-10-CM

## 2014-03-06 DIAGNOSIS — R7309 Other abnormal glucose: Secondary | ICD-10-CM

## 2014-03-06 LAB — BASIC METABOLIC PANEL
BUN: 12 mg/dL (ref 6–23)
CHLORIDE: 103 meq/L (ref 96–112)
CO2: 27 mEq/L (ref 19–32)
Calcium: 9.6 mg/dL (ref 8.4–10.5)
Creatinine, Ser: 0.7 mg/dL (ref 0.4–1.2)
GFR: 85.59 mL/min (ref >60.00)
GLUCOSE: 88 mg/dL (ref 70–99)
POTASSIUM: 4.4 meq/L (ref 3.5–5.1)
SODIUM: 137 meq/L (ref 135–145)

## 2014-03-06 LAB — LIPID PANEL
Cholesterol: 221 mg/dL — ABNORMAL HIGH (ref 0–200)
HDL: 49.9 mg/dL (ref >39.00)
LDL CALC: 145 mg/dL — AB (ref 0–99)
NonHDL: 171.1
TRIGLYCERIDES: 131 mg/dL (ref 0.0–149.0)
Total CHOL/HDL Ratio: 4
VLDL: 26.2 mg/dL (ref 0.0–40.0)

## 2014-03-06 LAB — CBC WITH DIFFERENTIAL/PLATELET
BASOS ABS: 0 10*3/uL (ref 0.0–0.1)
BASOS PCT: 0.4 % (ref 0.0–3.0)
EOS ABS: 0.1 10*3/uL (ref 0.0–0.7)
Eosinophils Relative: 1 % (ref 0.0–5.0)
HCT: 40.3 % (ref 36.0–46.0)
Hemoglobin: 13.3 g/dL (ref 12.0–15.0)
LYMPHS PCT: 26.8 % (ref 12.0–46.0)
Lymphs Abs: 1.7 10*3/uL (ref 0.7–4.0)
MCHC: 33 g/dL (ref 30.0–36.0)
MCV: 91 fl (ref 78.0–100.0)
MONO ABS: 0.4 10*3/uL (ref 0.1–1.0)
Monocytes Relative: 6.6 % (ref 3.0–12.0)
NEUTROS PCT: 65.2 % (ref 43.0–77.0)
Neutro Abs: 4 10*3/uL (ref 1.4–7.7)
Platelets: 305 10*3/uL (ref 150.0–400.0)
RBC: 4.43 Mil/uL (ref 3.87–5.11)
RDW: 14.1 % (ref 11.5–15.5)
WBC: 6.2 10*3/uL (ref 4.0–10.5)

## 2014-03-06 LAB — HEPATIC FUNCTION PANEL
ALK PHOS: 51 U/L (ref 39–117)
ALT: 20 U/L (ref 0–35)
AST: 21 U/L (ref 0–37)
Albumin: 4 g/dL (ref 3.5–5.2)
Bilirubin, Direct: 0.1 mg/dL (ref 0.0–0.3)
Total Bilirubin: 0.8 mg/dL (ref 0.2–1.2)
Total Protein: 7.2 g/dL (ref 6.0–8.3)

## 2014-03-06 LAB — HEMOGLOBIN A1C: Hgb A1c MFr Bld: 6.1 % (ref 4.6–6.5)

## 2014-03-06 LAB — TSH: TSH: 1.71 u[IU]/mL (ref 0.35–4.50)

## 2014-03-09 ENCOUNTER — Ambulatory Visit (INDEPENDENT_AMBULATORY_CARE_PROVIDER_SITE_OTHER): Payer: Commercial Managed Care - HMO | Admitting: Internal Medicine

## 2014-03-09 ENCOUNTER — Encounter: Payer: Self-pay | Admitting: Internal Medicine

## 2014-03-09 VITALS — BP 130/70 | HR 92 | Temp 98.5°F | Ht 65.0 in | Wt 169.2 lb

## 2014-03-09 DIAGNOSIS — L989 Disorder of the skin and subcutaneous tissue, unspecified: Secondary | ICD-10-CM

## 2014-03-09 DIAGNOSIS — R0989 Other specified symptoms and signs involving the circulatory and respiratory systems: Secondary | ICD-10-CM

## 2014-03-09 DIAGNOSIS — M899 Disorder of bone, unspecified: Secondary | ICD-10-CM

## 2014-03-09 DIAGNOSIS — E2839 Other primary ovarian failure: Secondary | ICD-10-CM

## 2014-03-09 DIAGNOSIS — R739 Hyperglycemia, unspecified: Secondary | ICD-10-CM

## 2014-03-09 DIAGNOSIS — M79609 Pain in unspecified limb: Secondary | ICD-10-CM

## 2014-03-09 DIAGNOSIS — G458 Other transient cerebral ischemic attacks and related syndromes: Secondary | ICD-10-CM

## 2014-03-09 DIAGNOSIS — M79605 Pain in left leg: Secondary | ICD-10-CM

## 2014-03-09 DIAGNOSIS — R7309 Other abnormal glucose: Secondary | ICD-10-CM

## 2014-03-09 DIAGNOSIS — M79604 Pain in right leg: Secondary | ICD-10-CM

## 2014-03-09 DIAGNOSIS — Z86010 Personal history of colonic polyps: Secondary | ICD-10-CM

## 2014-03-09 DIAGNOSIS — I10 Essential (primary) hypertension: Secondary | ICD-10-CM

## 2014-03-09 DIAGNOSIS — M8580 Other specified disorders of bone density and structure, unspecified site: Secondary | ICD-10-CM

## 2014-03-09 DIAGNOSIS — E7800 Pure hypercholesterolemia, unspecified: Secondary | ICD-10-CM

## 2014-03-09 DIAGNOSIS — Z23 Encounter for immunization: Secondary | ICD-10-CM

## 2014-03-09 DIAGNOSIS — M949 Disorder of cartilage, unspecified: Secondary | ICD-10-CM

## 2014-03-09 MED ORDER — MUPIROCIN 2 % EX OINT: 1.0000 "application " | TOPICAL_OINTMENT | Freq: Two times a day (BID) | CUTANEOUS | Status: DC

## 2014-03-09 NOTE — Progress Notes (Signed)
Pre visit review using our clinic review tool, if applicable. No additional management support is needed unless otherwise documented below in the visit note. 

## 2014-03-14 ENCOUNTER — Encounter: Payer: Self-pay | Admitting: Internal Medicine

## 2014-03-14 DIAGNOSIS — G458 Other transient cerebral ischemic attacks and related syndromes: Secondary | ICD-10-CM | POA: Insufficient documentation

## 2014-03-14 DIAGNOSIS — R739 Hyperglycemia, unspecified: Secondary | ICD-10-CM | POA: Insufficient documentation

## 2014-03-14 DIAGNOSIS — L989 Disorder of the skin and subcutaneous tissue, unspecified: Secondary | ICD-10-CM | POA: Insufficient documentation

## 2014-03-14 NOTE — Assessment & Plan Note (Addendum)
Low fat/low cholesterol diet.  Follow lipid profile.  Cholesterol just checked and LDL increased - 145.

## 2014-03-14 NOTE — Assessment & Plan Note (Signed)
Low carb diet and exercise.  Follow.  

## 2014-03-14 NOTE — Assessment & Plan Note (Signed)
Blood pressure as outlined.  Doing better.  Follow.  Follow metabolic panel.

## 2014-03-14 NOTE — Assessment & Plan Note (Signed)
Increased pain with standing at work.  Compression hose.  Follow.

## 2014-03-14 NOTE — Assessment & Plan Note (Signed)
Bone density 05/27/10 revealed osteopenia.  No significant change.  Continue vitamin D and weight bearing exercise.  Needs f/u bone density.  Schedule.

## 2014-03-14 NOTE — Assessment & Plan Note (Signed)
Colonoscopy as outlined.  Last colonoscopy 06/17/13.  Recommended f/u colonoscopy in five years.

## 2014-03-14 NOTE — Assessment & Plan Note (Signed)
Has been followed by vascular surgery.  

## 2014-03-14 NOTE — Assessment & Plan Note (Signed)
Is now s/u revascularization.  Followed by vascular surgery.  Doing well.  Follow.

## 2014-03-14 NOTE — Assessment & Plan Note (Signed)
bactroban as directed.  Follow  

## 2014-03-14 NOTE — Progress Notes (Signed)
  Subjective:    Patient ID: Whitney Carr, female    DOB: January 07, 1946, 68 y.o.   MRN: [id number]  Hypertension  68 year old female with past history of hypertension and hypercholesterolemia who comes in today for a scheduled follow up.  Here to f/u on her blood pressure.  Blood pressures have been doing better.   She is not working now.  Blood pressure better.  States averaging 130s/60s.  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  Doing well.  No pain.  No further dizziness.   Denies any chest pain or tightness.  Breathing is stable.  Eating and drinking well.  No acid reflux.  Taking plavix.  Legs feel better on plavix.  Wants to continue.  Had some leg lesions.  No redness extending up the leg.      Past Medical History  Diagnosis Date  . Hypercholesterolemia   . Hypertension   . GERD (gastroesophageal reflux disease)   . Degenerative joint disease     Current Outpatient Prescriptions on File Prior to Visit  Medication Sig Dispense Refill  . Calcium 600-200 MG-UNIT per tablet Take 1 tablet by mouth 2 (two) times daily.      . cholecalciferol (VITAMIN D) 1000 UNITS tablet Take 1,000 Units by mouth daily.      Marland Kitchen losartan (COZAAR) 50 MG tablet TAKE 1 TABLET BY MOUTH ONCE DAILY.  90 tablet  1   No current facility-administered medications on file prior to visit.    Review of Systems Patient denies any headache, lightheadedness or dizziness.  No significant sinus or allergy symptoms.  No chest pain, tightness or palpitations.  No increased shortness of breath or congestion. Cough resolved with stopping the lisinopril.   No acid reflux.  No nausea or vomiting.  No abdominal pain or cramping.  No bowel change, such as diarrhea, constipation, BRBPR or melana.  No urine change.  Doing well s/p her vascular intervention.  Feels better on plavix.  Wants to continue.  Leg lesions as outlined.        Objective:   Physical Exam  Filed Vitals:   03/09/14 1430   BP: 130/70  Pulse: 92  Temp: 98.5 F (36.9 C)   Blood pressure 128/68 left and 134/72 right.  68 year old female in no acute distress.   HEENT:  Nares- clear.  Oropharynx - without lesions. NECK:  Supple.  Nontender.    HEART:  Appears to be regular. LUNGS:  No crackles or wheezing audible.  Respirations even and unlabored.  RADIAL PULSE:  Equal bilaterally.  ABDOMEN:  Soft, nontender.  Bowel sounds present and normal.  No audible abdominal bruit.    EXTREMITIES:  No increased edema present.  DP pulses palpable and equal bilaterally.             Assessment & Plan:  SUBCLAVIAN STEAL.  S/p vascular intervention/stenting.  On plavix and aspirin now.  Doing well.  Wants to continue on plavix.  Continue to follow up with vascular surgery.      CARDIOVASCULAR.  Currently asymptomatic.  Continue risk factor modification.    CAROTID BRUIT.  Followed by vascular surgery.    HEALTH MAINTENANCE.  Is s/p hysterectomy and does not require yearly pap smears.  Physical 01/20/14.   Colonoscopy 11/09 revealed small polyp that was removed.  Recommended follow up colonoscopy 5 years.   Had f/u colonoscopy 06/16/13.  Recommend f/u five years.  Mammogram 09/04/13 - Birads I.

## 2014-05-22 ENCOUNTER — Other Ambulatory Visit: Payer: Self-pay | Admitting: Internal Medicine

## 2014-07-07 ENCOUNTER — Other Ambulatory Visit (INDEPENDENT_AMBULATORY_CARE_PROVIDER_SITE_OTHER): Payer: Medicare Other

## 2014-07-07 DIAGNOSIS — E7800 Pure hypercholesterolemia, unspecified: Secondary | ICD-10-CM

## 2014-07-07 DIAGNOSIS — R739 Hyperglycemia, unspecified: Secondary | ICD-10-CM

## 2014-07-07 DIAGNOSIS — I10 Essential (primary) hypertension: Secondary | ICD-10-CM | POA: Diagnosis not present

## 2014-07-07 LAB — LIPID PANEL
CHOLESTEROL: 233 mg/dL — AB (ref 0–200)
HDL: 49.9 mg/dL (ref >39.00)
LDL Cholesterol: 156 mg/dL — ABNORMAL HIGH (ref 0–99)
NonHDL: 183.1
Total CHOL/HDL Ratio: 5
Triglycerides: 134 mg/dL (ref 0.0–149.0)
VLDL: 26.8 mg/dL (ref 0.0–40.0)

## 2014-07-07 LAB — COMPREHENSIVE METABOLIC PANEL
ALK PHOS: 52 U/L (ref 39–117)
ALT: 14 U/L (ref 0–35)
AST: 16 U/L (ref 0–37)
Albumin: 4.5 g/dL (ref 3.5–5.2)
BILIRUBIN TOTAL: 0.8 mg/dL (ref 0.2–1.2)
BUN: 19 mg/dL (ref 6–23)
CO2: 27 mEq/L (ref 19–32)
Calcium: 9.6 mg/dL (ref 8.4–10.5)
Chloride: 105 mEq/L (ref 96–112)
Creatinine, Ser: 0.8 mg/dL (ref 0.4–1.2)
GFR: 80.34 mL/min (ref >60.00)
Glucose, Bld: 107 mg/dL — ABNORMAL HIGH (ref 70–99)
Potassium: 4.6 mEq/L (ref 3.5–5.1)
SODIUM: 138 meq/L (ref 135–145)
TOTAL PROTEIN: 8 g/dL (ref 6.0–8.3)

## 2014-07-07 LAB — HEMOGLOBIN A1C: Hgb A1c MFr Bld: 6.1 % (ref 4.6–6.5)

## 2014-07-09 ENCOUNTER — Ambulatory Visit (INDEPENDENT_AMBULATORY_CARE_PROVIDER_SITE_OTHER): Payer: Medicare Other | Admitting: Internal Medicine

## 2014-07-09 ENCOUNTER — Encounter: Payer: Self-pay | Admitting: Internal Medicine

## 2014-07-09 VITALS — BP 120/70 | HR 83 | Temp 98.5°F | Ht 65.0 in | Wt 171.0 lb

## 2014-07-09 DIAGNOSIS — M8580 Other specified disorders of bone density and structure, unspecified site: Secondary | ICD-10-CM | POA: Diagnosis not present

## 2014-07-09 DIAGNOSIS — E7800 Pure hypercholesterolemia, unspecified: Secondary | ICD-10-CM

## 2014-07-09 DIAGNOSIS — I10 Essential (primary) hypertension: Secondary | ICD-10-CM

## 2014-07-09 DIAGNOSIS — G458 Other transient cerebral ischemic attacks and related syndromes: Secondary | ICD-10-CM

## 2014-07-09 DIAGNOSIS — R739 Hyperglycemia, unspecified: Secondary | ICD-10-CM | POA: Diagnosis not present

## 2014-07-09 DIAGNOSIS — Z86010 Personal history of colonic polyps: Secondary | ICD-10-CM

## 2014-07-09 DIAGNOSIS — R0989 Other specified symptoms and signs involving the circulatory and respiratory systems: Secondary | ICD-10-CM

## 2014-07-09 MED ORDER — PRAVASTATIN SODIUM 10 MG PO TABS: 10.0000 mg | ORAL_TABLET | Freq: Every day | ORAL | Status: DC

## 2014-07-09 NOTE — Progress Notes (Signed)
Pre visit review using our clinic review tool, if applicable. No additional management support is needed unless otherwise documented below in the visit note. 

## 2014-07-12 ENCOUNTER — Encounter: Payer: Self-pay | Admitting: Internal Medicine

## 2014-07-12 NOTE — Progress Notes (Signed)
Subjective:    Patient ID: Whitney Carr, female    DOB: May 15, 1946, 69 y.o.   MRN: [id number]  HPI 69 year old female with past history of hypertension and hypercholesterolemia who comes in today for a scheduled follow up.  Blood pressures - doing ok.    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  Doing well.  No pain.  No further dizziness.   Denies any chest pain or tightness.  Breathing is stable.  Eating and drinking well.  No acid reflux.  Taking plavix.  Legs feel better on plavix.  Wants to continue. Not working.  Discussed starting cholesterol medication.     Past Medical History  Diagnosis Date  . Hypercholesterolemia   . Hypertension   . GERD (gastroesophageal reflux disease)   . Degenerative joint disease     Current Outpatient Prescriptions on File Prior to Visit  Medication Sig Dispense Refill  . aspirin 81 MG tablet Take 81 mg by mouth daily.    . Calcium 600-200 MG-UNIT per tablet Take 1 tablet by mouth 2 (two) times daily.    . cholecalciferol (VITAMIN D) 1000 UNITS tablet Take 1,000 Units by mouth daily.    Marland Kitchen losartan (COZAAR) 50 MG tablet TAKE (1) TABLET BY MOUTH DAILY FOR HIGH BLOOD PRESSURE. 90 tablet 1  . mupirocin ointment (BACTROBAN) 2 % Place 1 application into the nose 2 (two) times daily. 22 g 0   No current facility-administered medications on file prior to visit.    Review of Systems Patient denies any headache, lightheadedness or dizziness.  No significant sinus or allergy symptoms.  No chest pain, tightness or palpitations.  No increased shortness of breath or congestion.  Recently treated for bronchitis.  Better now.   No acid reflux.  No nausea or vomiting.  No abdominal pain or cramping.  No bowel change, such as diarrhea, constipation, BRBPR or melana.  No urine change.  Doing well s/p her vascular intervention.  Feels better on plavix.  Wants to continue.  Not working.  Stress better.  Discussed cholesterol  elevation.  Discussed starting cholesterol medication.          Objective:   Physical Exam  Filed Vitals:   07/09/14 1428  BP: 120/70  Pulse: 83  Temp: 98.5 F (36.9 C)   Blood pressure recheck:  32/6  69 year old female in no acute distress.   HEENT:  Nares- clear.  Oropharynx - without lesions. NECK:  Supple.  Nontender.    HEART:  Appears to be regular. LUNGS:  No crackles or wheezing audible.  Respirations even and unlabored.  RADIAL PULSE:  Equal bilaterally. ABDOMEN:  Soft, nontender.  Bowel sounds present and normal.  No audible abdominal bruit.    EXTREMITIES:  No increased edema present.  DP pulses palpable and equal bilaterally.             Assessment & Plan:  1. Hypercholesterolemia Low cholesterol diet and exercise.  Discussed cholesterol medication.  She is agreeable.  Start pravastatin 10mg  q day.   - Hepatic function panel; Future  2. Subclavian steal syndrome S/p stent placement.  Followed by vascular surgery.  On plavix.    3. Essential hypertension Blood pressure doing well.  Follow.    4. Osteopenia Check bone density.    5. Hyperglycemia Low carb diet and exercise.  Follow.    6. Carotid bruit, unspecified laterality Followed by vascular surgery.    7. History of colonic  polyps Colonoscopy as outlined.    8. SUBCLAVIAN STEAL.  S/p vascular intervention/stenting.  On plavix and aspirin now.  Doing well.  Wants to continue on plavix.  Continue to follow up with vascular surgery.      9. CARDIOVASCULAR.  Currently asymptomatic.  Continue risk factor modification.    10. CAROTID BRUIT.  Followed by vascular surgery.    HEALTH MAINTENANCE.  Is s/p hysterectomy and does not require yearly pap smears.  Physical - (01/20/14).   Colonoscopy 11/09 revealed small polyp that was removed.  Recommended follow up colonoscopy 5 years.   Had f/u colonoscopy 06/16/13.  Recommend f/u five years.  Mammogram 09/04/13 - Birads I.

## 2014-07-23 DIAGNOSIS — N958 Other specified menopausal and perimenopausal disorders: Secondary | ICD-10-CM | POA: Diagnosis not present

## 2014-07-23 LAB — HM DEXA SCAN

## 2014-07-24 ENCOUNTER — Encounter: Payer: Self-pay | Admitting: Internal Medicine

## 2014-07-26 ENCOUNTER — Telehealth: Payer: Self-pay | Admitting: Internal Medicine

## 2014-07-26 NOTE — Telephone Encounter (Signed)
Notify pt that her bone density reveals osteoporosis.  (bone density has decreased compared to the previous bone density).  Needs an appt scheduled to discuss treatment for osteoporosis.

## 2014-07-27 NOTE — Telephone Encounter (Signed)
Pt notified of results. Please let me know where to put patient in on your schedule. Im not able to find anything this month. Patient next scheduled appt is in May. Please advise

## 2014-07-27 NOTE — Telephone Encounter (Signed)
Please schedule for 08/28/14 at 12:00.  Thanks.

## 2014-07-29 ENCOUNTER — Encounter: Payer: Self-pay | Admitting: Internal Medicine

## 2014-08-06 ENCOUNTER — Ambulatory Visit: Payer: Self-pay

## 2014-08-06 DIAGNOSIS — S069X9A Unspecified intracranial injury with loss of consciousness of unspecified duration, initial encounter: Secondary | ICD-10-CM | POA: Diagnosis not present

## 2014-08-06 DIAGNOSIS — S0083XA Contusion of other part of head, initial encounter: Secondary | ICD-10-CM | POA: Diagnosis not present

## 2014-08-06 DIAGNOSIS — S199XXA Unspecified injury of neck, initial encounter: Secondary | ICD-10-CM | POA: Diagnosis not present

## 2014-08-06 DIAGNOSIS — Y999 Unspecified external cause status: Secondary | ICD-10-CM | POA: Diagnosis not present

## 2014-08-06 DIAGNOSIS — S0093XA Contusion of unspecified part of head, initial encounter: Secondary | ICD-10-CM | POA: Diagnosis not present

## 2014-08-06 DIAGNOSIS — M542 Cervicalgia: Secondary | ICD-10-CM | POA: Diagnosis not present

## 2014-08-20 ENCOUNTER — Other Ambulatory Visit (INDEPENDENT_AMBULATORY_CARE_PROVIDER_SITE_OTHER): Payer: Medicare Other

## 2014-08-20 DIAGNOSIS — E7800 Pure hypercholesterolemia, unspecified: Secondary | ICD-10-CM

## 2014-08-20 LAB — HEPATIC FUNCTION PANEL
ALBUMIN: 4.4 g/dL (ref 3.5–5.2)
ALT: 15 U/L (ref 0–35)
AST: 17 U/L (ref 0–37)
Alkaline Phosphatase: 58 U/L (ref 39–117)
Bilirubin, Direct: 0.1 mg/dL (ref 0.0–0.3)
Total Bilirubin: 0.5 mg/dL (ref 0.2–1.2)
Total Protein: 7.6 g/dL (ref 6.0–8.3)

## 2014-08-21 ENCOUNTER — Telehealth: Payer: Self-pay | Admitting: Internal Medicine

## 2014-08-21 ENCOUNTER — Encounter: Payer: Self-pay | Admitting: Internal Medicine

## 2014-08-21 DIAGNOSIS — I10 Essential (primary) hypertension: Secondary | ICD-10-CM

## 2014-08-21 DIAGNOSIS — E7800 Pure hypercholesterolemia, unspecified: Secondary | ICD-10-CM

## 2014-08-21 DIAGNOSIS — R739 Hyperglycemia, unspecified: Secondary | ICD-10-CM

## 2014-08-21 NOTE — Telephone Encounter (Signed)
Pt notified of lab results via my chart.  Needs a fasting lab appt 1-2 days before her 11/12/14 appt.  Please schedule and notify pt of appt date and time.  Thanks.    Dr Nicki Reaper

## 2014-08-25 NOTE — Telephone Encounter (Signed)
Unread mychart message mailed to patient 

## 2014-08-28 ENCOUNTER — Encounter: Payer: Self-pay | Admitting: Internal Medicine

## 2014-08-28 ENCOUNTER — Ambulatory Visit (INDEPENDENT_AMBULATORY_CARE_PROVIDER_SITE_OTHER): Payer: Medicare Other | Admitting: Internal Medicine

## 2014-08-28 VITALS — BP 127/70 | HR 107 | Temp 98.0°F | Ht 65.0 in | Wt 173.4 lb

## 2014-08-28 DIAGNOSIS — I10 Essential (primary) hypertension: Secondary | ICD-10-CM | POA: Diagnosis not present

## 2014-08-28 DIAGNOSIS — M810 Age-related osteoporosis without current pathological fracture: Secondary | ICD-10-CM

## 2014-08-28 MED ORDER — ALENDRONATE SODIUM 70 MG PO TABS: 70.0000 mg | ORAL_TABLET | ORAL | Status: DC

## 2014-08-28 MED ORDER — PRAVASTATIN SODIUM 10 MG PO TABS: 10.0000 mg | ORAL_TABLET | Freq: Every day | ORAL | Status: DC

## 2014-08-28 NOTE — Progress Notes (Signed)
Pre visit review using our clinic review tool, if applicable. No additional management support is needed unless otherwise documented below in the visit note. 

## 2014-09-06 ENCOUNTER — Encounter: Payer: Self-pay | Admitting: Internal Medicine

## 2014-09-06 NOTE — Assessment & Plan Note (Signed)
Discussed bone density results and treatment options.  Agreed to start fosamax.  Calcium and vitamin D.  Weight bearing exercise.  Follow.

## 2014-09-06 NOTE — Assessment & Plan Note (Signed)
Blood pressure controlled.  Same medication regimen.
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
## 2014-09-09 DIAGNOSIS — H26491 Other secondary cataract, right eye: Secondary | ICD-10-CM | POA: Diagnosis not present

## 2014-10-07 ENCOUNTER — Ambulatory Visit
Admit: 2014-10-07 | Disposition: A | Discharge status: Home / Self Care | Payer: Self-pay | Attending: Internal Medicine | Admitting: Internal Medicine

## 2014-10-07 DIAGNOSIS — Z1231 Encounter for screening mammogram for malignant neoplasm of breast: Secondary | ICD-10-CM | POA: Diagnosis not present

## 2014-10-07 LAB — HM MAMMOGRAPHY: HM Mammogram: NEGATIVE

## 2014-10-08 DIAGNOSIS — H26491 Other secondary cataract, right eye: Secondary | ICD-10-CM | POA: Diagnosis not present

## 2014-10-16 NOTE — Op Note (Signed)
PATIENT NAME:  Whitney Carr, Whitney Carr MR#:  [id number] DATE OF BIRTH:  April 11, 1946  DATE OF PROCEDURE:  07/02/2012  PREOPERATIVE DIAGNOSES: 1.  Subclavian steal.  2.  Syncope.  3.  Pain, left hand.   POSTOPERATIVE DIAGNOSES: 1.  Subclavian steal. 2.  Syncope. 3.  Pain, left hand.  PROCEDURES PERFORMED:  1.  Thoracic arch aortogram in the LAO projection.  2.  Left arm arteriography, third order catheter placement.  3.  Percutaneous transluminal angioplasty and stent placement, proximal left subclavian artery.  4.  ProGlide closure right common femoral artery.   SURGEON: Katha Cabal, M.D.   ANESTHESIA:  Versed 3 mg plus fentanyl 100 mcg administered IV. Continuous ECG, pulse oximetry and cardiopulmonary monitoring was performed throughout the entire procedure by the Interventional Radiology nurse.   TOTAL SEDATION TIME:  Approximately one hour.   ACCESS:  6-French sheath, right common femoral artery.   FLUOROSCOPY TIME: 4.5 minutes.   CONTRAST USED: Isovue 63 mL.   INDICATIONS: Ms. Ek is a 69 year old woman who presents with increasing pain in her left arm. She has very physically demanding job and is lifting quite a few things and has been having increasing difficulty with this.  She has also had multiple episodes of syncope and was found on duplex ultrasound to have a pattern of flow, consistent with subclavian steal phenomena. Risks and benefits for angiography with the hope for intervention of the left subclavian were reviewed. All questions were answered. The patient has agreed to proceed.   DESCRIPTION OF PROCEDURE: The patient is taken to Special Procedures and placed in the supine position. After adequate sedation is achieved, both groins are prepped and draped in a sterile fashion. Ultrasound is placed in a sterile sleeve, secondary to lack of appropriate landmarks. Common femoral artery is identified. It is then scanned down and the femoral bifurcation is localized. It is  then scanned more proximally, so that the true common femoral is readily visualized. It is echolucent and pulsatile, indicating patency. Image is recorded for the permanent record. Under record visualization after 1% lidocaine has been infiltrated, micropuncture needle is inserted, microwire followed by micro sheath, J-wire followed by a 5-French sheath and 5-French pigtail catheter. Pigtail catheter is advanced into the ascending aorta and LAO projection is obtained of the thoracic arch. After review of the image, pigtail catheter is exchanged over a 260 stiff angled Glidewire for an H1 and the wire and H1 catheter are negotiated into the subclavian. Hand injection of contrast demonstrates an approximately 90% stenosis, approximately 1 cm distal to the origin.  The proximal vertebral is identified, as is the mammary arteries.  The mammary is widely patent and the LIMA demonstrates greater than 50, probably a 60% stenosis at its origin. The subclavian is otherwise widely patent, as is the axillary and the catheter is then advanced out into the brachial artery where distal runoff is obtained. Amplatz Super Stiff wire is then advanced through the H1 catheter and the 5-French sheath is exchanged for a 6-French 90 cm shuttle. Unfortunately, the stent desired is on an 80 cm shaft and the 6-French shuttle is then exchanged for a 6-French Raabe. The Michiel Cowboy does extend up to within several millimeters of the origin of the subclavian and therefore, follow-up imaging is obtainable through the [facility] sheath. Magnified view of the subclavian lesion is then obtained.  Measurements are made and a 7 x 39 Omnilink stent is selected, advanced across the lesion and expanded to 12 atmospheres. Follow-up imaging  demonstrates that the stent is still not well opposed to the wall distally and there is a fairly large step-off proximally and therefore an 8 x 4 balloon is advanced across the lesion and the stents post-dilated to 8 mm.   Follow-up angiography now demonstrates that the stent more distally is well opposed to the wall and in fact now has flared nicely and is appropriately sized proximally, as well. Distal runoff is preserved.   Perclose device is then opened onto the field after RAO projection of the right groin is obtained and the Perclose device deployed without difficulty. There are no immediate complications.   INTERPRETATION: The aortic arch is normal anatomy.  The left subclavian is somewhat tortuous and somewhat small, but it is a type I arch. There are no hemodynamically significant stenoses noted or ulcerative lesions identified. In the proximal subclavian, approximately 1 cm distal to its origin, there is a 90% stenosis over a distance of 20 to 25 mm.  There is approximately 15 to 20 mm of normal subclavian leading to the origin of the vertebral, which demonstrates a 60% stenosis. Distal to that the LIMA has a widely patent takeoff. The subclavian beyond the focal lesion is otherwise widely patent and free of hemodynamically significant disease. Axillary and brachial arteries are widely patent.  Trifurcation is noted at the level of the antecubital fossa and is patent down to the wrist filling palmar arch.   Following angioplasty, the stent is somewhat under sized and an 8 mm balloon is used to post-dilate the stent with an excellent result. There is less than 5% residual stenosis.   This summarizes successful recanalization of the left subclavian for treatment of subclavian steal, which is symptomatic.  ____________________________ Katha Cabal, MD ggs:eg D: 07/02/2012 13:09:00 ET T: 07/02/2012 20:45:35 ET JOB#: [id number]  cc: Einar Pheasant, MD Katha Cabal, MD, <Dictator>   Katha Cabal MD ELECTRONICALLY SIGNED 07/05/2012 7:51

## 2014-10-16 NOTE — Op Note (Signed)
PATIENT NAME:  Whitney Carr, Whitney Carr MR#:  [id number] DATE OF BIRTH:  [birth year]  DATE OF PROCEDURE:  07/02/2012  PREOPERATIVE DIAGNOSES: 1.  Subclavian steal.  2.  Syncope.  3.  Pain, left hand.   POSTOPERATIVE DIAGNOSES: 1.  Subclavian steal. 2.  Syncope. 3.  Pain, left hand.  PROCEDURES PERFORMED:  1.  Thoracic arch aortogram in the LAO projection.  2.  Left arm arteriography, third order catheter placement.  3.  Percutaneous transluminal angioplasty and stent placement, proximal left subclavian artery.  4.  ProGlide closure right common femoral artery.   SURGEON: Katha Cabal, M.D.   ANESTHESIA:  Versed 3 mg plus fentanyl 100 mcg administered IV. Continuous ECG, pulse oximetry and cardiopulmonary monitoring was performed throughout the entire procedure by the Interventional Radiology nurse.   TOTAL SEDATION TIME:  Approximately one hour.   ACCESS:  6-French sheath, right common femoral artery.   FLUOROSCOPY TIME: 4.5 minutes.   CONTRAST USED: Isovue 63 mL.   INDICATIONS: Ms. Huie is a 69 year old woman who presents with increasing pain in her left arm. She has very physically demanding job and is lifting quite a few things and has been having increasing difficulty with this.  She has also had multiple episodes of syncope and was found on duplex ultrasound to have a pattern of flow, consistent with subclavian steal phenomena. Risks and benefits for angiography with the hope for intervention of the left subclavian were reviewed. All questions were answered. The patient has agreed to proceed.   DESCRIPTION OF PROCEDURE: The patient is taken to Special Procedures and placed in the supine position. After adequate sedation is achieved, both groins are prepped and draped in a sterile fashion. Ultrasound is placed in a sterile sleeve, secondary to lack of appropriate landmarks. Common femoral artery is identified. It is then scanned down and the femoral bifurcation is localized. It is  then scanned more proximally, so that the true common femoral is readily visualized. It is echolucent and pulsatile, indicating patency. Image is recorded for the permanent record. Under record visualization after 1% lidocaine has been infiltrated, micropuncture needle is inserted, microwire followed by micro sheath, J-wire followed by a 5-French sheath and 5-French pigtail catheter. Pigtail catheter is advanced into the ascending aorta and LAO projection is obtained of the thoracic arch. After review of the image, pigtail catheter is exchanged over a 260 stiff angled Glidewire for an H1 and the wire and H1 catheter are negotiated into the subclavian. Hand injection of contrast demonstrates an approximately 90% stenosis, approximately 1 cm distal to the origin.  The proximal vertebral is identified, as is the mammary arteries.  The mammary is widely patent and the LIMA demonstrates greater than 50, probably a 60% stenosis at its origin. The subclavian is otherwise widely patent, as is the axillary and the catheter is then advanced out into the brachial artery where distal runoff is obtained. Amplatz Super Stiff wire is then advanced through the H1 catheter and the 5-French sheath is exchanged for a 6-French 90 cm shuttle. Unfortunately, the stent desired is on an 80 cm shaft and the 6-French shuttle is then exchanged for a 6-French Raabe. The Michiel Cowboy does extend up to within several millimeters of the origin of the subclavian and therefore, follow-up imaging is obtainable through the [facility] sheath. Magnified view of the subclavian lesion is then obtained.  Measurements are made and a 7 x 39 Omnilink stent is selected, advanced across the lesion and expanded to 12 atmospheres. Follow-up imaging  demonstrates that the stent is still not well opposed to the wall distally and there is a fairly large step-off proximally and therefore an 8 x 4 balloon is advanced across the lesion and the stents post-dilated to 8 mm.   Follow-up angiography now demonstrates that the stent more distally is well opposed to the wall and in fact now has flared nicely and is appropriately sized proximally, as well. Distal runoff is preserved.   Perclose device is then opened onto the field after RAO projection of the right groin is obtained and the Perclose device deployed without difficulty. There are no immediate complications.   INTERPRETATION: The aortic arch is normal anatomy.  The left subclavian is somewhat tortuous and somewhat small, but it is a type I arch. There are no hemodynamically significant stenoses noted or ulcerative lesions identified. In the proximal subclavian, approximately 1 cm distal to its origin, there is a 90% stenosis over a distance of 20 to 25 mm.  There is approximately 15 to 20 mm of normal subclavian leading to the origin of the vertebral, which demonstrates a 60% stenosis. Distal to that the LIMA has a widely patent takeoff. The subclavian beyond the focal lesion is otherwise widely patent and free of hemodynamically significant disease. Axillary and brachial arteries are widely patent.  Trifurcation is noted at the level of the antecubital fossa and is patent down to the wrist filling palmar arch.   Following angioplasty, the stent is somewhat under sized and an 8 mm balloon is used to post-dilate the stent with an excellent result. There is less than 5% residual stenosis.   This summarizes successful recanalization of the left subclavian for treatment of subclavian steal, which is symptomatic.  ____________________________ Katha Cabal, MD ggs:eg D: 07/02/2012 13:09:27 ET T: 07/02/2012 20:45:35 ET JOB#: [id number]  cc: Katha Cabal, MD, <Dictator> Einar Pheasant, MD

## 2014-11-10 ENCOUNTER — Other Ambulatory Visit: Payer: Medicare Other

## 2014-11-12 ENCOUNTER — Encounter: Payer: Self-pay | Admitting: Internal Medicine

## 2014-11-12 ENCOUNTER — Ambulatory Visit (INDEPENDENT_AMBULATORY_CARE_PROVIDER_SITE_OTHER): Payer: Medicare Other | Admitting: Internal Medicine

## 2014-11-12 VITALS — BP 124/70 | HR 85 | Temp 98.4°F | Ht 65.5 in | Wt 173.1 lb

## 2014-11-12 DIAGNOSIS — H029 Unspecified disorder of eyelid: Secondary | ICD-10-CM

## 2014-11-12 DIAGNOSIS — R0989 Other specified symptoms and signs involving the circulatory and respiratory systems: Secondary | ICD-10-CM

## 2014-11-12 DIAGNOSIS — M810 Age-related osteoporosis without current pathological fracture: Secondary | ICD-10-CM | POA: Diagnosis not present

## 2014-11-12 DIAGNOSIS — G458 Other transient cerebral ischemic attacks and related syndromes: Secondary | ICD-10-CM | POA: Diagnosis not present

## 2014-11-12 DIAGNOSIS — E7800 Pure hypercholesterolemia, unspecified: Secondary | ICD-10-CM

## 2014-11-12 DIAGNOSIS — I10 Essential (primary) hypertension: Secondary | ICD-10-CM

## 2014-11-12 DIAGNOSIS — R739 Hyperglycemia, unspecified: Secondary | ICD-10-CM

## 2014-11-12 DIAGNOSIS — Z860100 Personal history of colon polyps, unspecified: Secondary | ICD-10-CM

## 2014-11-12 NOTE — Progress Notes (Signed)
Pre visit review using our clinic review tool, if applicable. No additional management support is needed unless otherwise documented below in the visit note. 

## 2014-11-12 NOTE — Progress Notes (Signed)
Subjective:  Patient ID: Whitney Carr, female    DOB: 1945/09/10  Age: 69 y.o. MRN: [id number]  CC: The primary encounter diagnosis was Essential hypertension. Diagnoses of Subclavian steal syndrome, Osteoporosis, Carotid bruit, unspecified laterality, History of colonic polyps, Hypercholesteremia, Hyperglycemia, and Eyelid lesion were also pertinent to this visit.  HPI Whitney Carr presents to follow up on her current medical issues as well as for her physical exam.  Tries to stay active.  No cardiac symptoms with increased activity or exertion.  Breathing stable.  Eating and drinking well.  Bowels stable.  No nausea or vomiting.  Had to stop her cholesterol medication secondary to leg weakness.  Feels better since stopping the cholesterol medication.  Seeing vascular surgery.  Doing well.     Past Medical History  Diagnosis Date  . Hypercholesterolemia   . Hypertension   . GERD (gastroesophageal reflux disease)   . Degenerative joint disease     Outpatient Prescriptions Prior to Visit  Medication Sig Dispense Refill  . alendronate (FOSAMAX) 70 MG tablet Take 1 tablet (70 mg total) by mouth every 7 (seven) days. Take with a full glass of water on an empty stomach. 4 tablet 11  . aspirin 81 MG tablet Take 81 mg by mouth daily.    . Calcium 600-200 MG-UNIT per tablet Take 1 tablet by mouth 2 (two) times daily.    . cholecalciferol (VITAMIN D) 1000 UNITS tablet Take 1,000 Units by mouth daily.    Marland Kitchen losartan (COZAAR) 50 MG tablet TAKE (1) TABLET BY MOUTH DAILY FOR HIGH BLOOD PRESSURE. 90 tablet 1  . mupirocin ointment (BACTROBAN) 2 % Place 1 application into the nose 2 (two) times daily. 22 g 0  . pravastatin (PRAVACHOL) 10 MG tablet Take 1 tablet (10 mg total) by mouth daily. 30 tablet 3   No facility-administered medications prior to visit.    ROS Review of Systems  Constitutional: Negative for fatigue and unexpected weight change.  HENT: Negative for congestion, sinus  pressure and sore throat.   Eyes: Negative for pain and visual disturbance.  Respiratory: Negative for cough, chest tightness and shortness of breath.   Cardiovascular: Negative for chest pain and palpitations.  Gastrointestinal: Negative for abdominal pain, diarrhea and constipation.  Genitourinary: Negative for frequency and difficulty urinating.  Musculoskeletal: Negative for back pain and joint swelling.  Skin: Positive for rash (eyelid rash - right.  persistent/intermittent.  ). Negative for color change.  Neurological: Negative for dizziness and headaches.  Hematological: Negative for adenopathy. Does not bruise/bleed easily.  Psychiatric/Behavioral: Negative for dysphoric mood and decreased concentration.    Objective:  BP 124/70 mmHg  Pulse 85  Temp(Src) 98.4 F (36.9 C) (Oral)  Ht 5' 5.5" (1.664 m)  Wt 173 lb 2 oz (78.529 kg)  BMI 28.36 kg/m2  SpO2 97%  LMP 06/03/1986  BP Readings from Last 3 Encounters:  11/12/14 124/70  08/28/14 127/70  07/09/14 120/70    Wt Readings from Last 3 Encounters:  11/12/14 173 lb 2 oz (78.529 kg)  08/28/14 173 lb 6 oz (78.642 kg)  07/09/14 171 lb (77.565 kg)    Blood pressure recheck:  136/78  Physical Exam  Constitutional: She is oriented to person, place, and time. She appears well-developed and well-nourished.  HENT:  Nose: Nose normal.  Mouth/Throat: Oropharynx is clear and moist.  Eyes: Right eye exhibits no discharge. Left eye exhibits no discharge. No scleral icterus.  Neck: Neck supple. No thyromegaly present.  Cardiovascular: Normal  rate and regular rhythm.   Pulmonary/Chest: Breath sounds normal. No accessory muscle usage. No tachypnea. No respiratory distress. She has no decreased breath sounds. She has no wheezes. She has no rhonchi. Right breast exhibits no inverted nipple, no mass, no nipple discharge and no tenderness (no axillary adenopathy). Left breast exhibits no inverted nipple, no mass, no nipple discharge and  no tenderness (no axilarry adenopathy).  Abdominal: Soft. Bowel sounds are normal. There is no tenderness.  Musculoskeletal: She exhibits no edema or tenderness.  Lymphadenopathy:    She has no cervical adenopathy.  Neurological: She is alert and oriented to person, place, and time.  Skin: Skin is warm.  Rash noted over eyelid.   Psychiatric: She has a normal mood and affect. Her behavior is normal.    Lab Results  Component Value Date   HGBA1C 6.1 07/07/2014   HGBA1C 6.1 03/06/2014   HGBA1C 5.9 09/09/2013    Lab Results  Component Value Date   CREATININE 0.8 07/07/2014   CREATININE 0.7 03/06/2014   CREATININE 0.90 08/07/2013    Lab Results  Component Value Date   WBC 6.2 03/06/2014   HGB 13.3 03/06/2014   HCT 40.3 03/06/2014   PLT 305.0 03/06/2014   GLUCOSE 107* 07/07/2014   CHOL 233* 07/07/2014   TRIG 134.0 07/07/2014   HDL 49.90 07/07/2014   LDLDIRECT 143.6 05/13/2012   LDLCALC 156* 07/07/2014   ALT 15 08/20/2014   AST 17 08/20/2014   NA 138 07/07/2014   K 4.6 07/07/2014   CL 105 07/07/2014   CREATININE 0.8 07/07/2014   BUN 19 07/07/2014   CO2 27 07/07/2014   TSH 1.71 03/06/2014   HGBA1C 6.1 07/07/2014     Assessment & Plan:   Problem List Items Addressed This Visit    Carotid bruit    Followed by vascular surgery.       Eyelid lesion    bactroban ointment as directed.  Follow.  Notify me if persistent.        History of colonic polyps    Colonoscopy 06/17/13.  Recommended f/u colonoscopy in five years.       Hypercholesteremia    Did not tolerate pravastatin.  Had leg weakness.  Symptoms improved once stopping the pravastatin.  Low cholesterol diet and exercise.        Hyperglycemia    Low carb diet and exercise.  Follow met b and a1c        Hypertension - Primary    Blood pressure doing well.  Same medication regimen.  Follow pressures.  Follow metabolic panel.        Osteoporosis    On fosamax.  Just started.  Continue calcium and  vitamin D supplements.        Subclavian steal syndrome    Is s/p revascularization.  Followed by vascular surgery.  Doing well.          I have discontinued Whitney Carr's mupirocin ointment and pravastatin. I am also having her maintain her Calcium, cholecalciferol, aspirin, losartan, and alendronate.    Medications Discontinued During This Encounter  Medication Reason  . mupirocin ointment (BACTROBAN) 2 % Patient has not taken in last 30 days  . pravastatin (PRAVACHOL) 10 MG tablet Side effect (s)   I spent 25 minutes with the patient and more than 50% of the time was spent in consultation regarding the above.     Follow-up: Return in about 4 months (around 03/15/2015) for follow up appt (35mn).  Einar Pheasant, MD

## 2014-11-23 ENCOUNTER — Encounter: Payer: Self-pay | Admitting: Internal Medicine

## 2014-11-23 DIAGNOSIS — H029 Unspecified disorder of eyelid: Secondary | ICD-10-CM | POA: Insufficient documentation

## 2014-11-23 NOTE — Assessment & Plan Note (Signed)
bactroban ointment as directed.  Follow.  Notify me if persistent.

## 2014-11-23 NOTE — Assessment & Plan Note (Signed)
Did not tolerate pravastatin.  Had leg weakness.  Symptoms improved once stopping the pravastatin.  Low cholesterol diet and exercise.

## 2014-11-23 NOTE — Assessment & Plan Note (Signed)
Low carb diet and exercise.  Follow met b and a1c

## 2014-11-23 NOTE — Assessment & Plan Note (Signed)
Blood pressure doing well.  Same medication regimen.  Follow pressures.  Follow metabolic panel.   

## 2014-11-23 NOTE — Assessment & Plan Note (Signed)
On fosamax.  Just started.  Continue calcium and vitamin D supplements.

## 2014-11-23 NOTE — Assessment & Plan Note (Signed)
Followed by vascular surgery. 

## 2014-11-23 NOTE — Assessment & Plan Note (Signed)
Is s/p revascularization.  Followed by vascular surgery.  Doing well.

## 2014-11-23 NOTE — Assessment & Plan Note (Signed)
Colonoscopy 06/17/13.  Recommended f/u colonoscopy in five years.

## 2014-12-08 ENCOUNTER — Other Ambulatory Visit: Payer: Medicare Other

## 2014-12-10 ENCOUNTER — Other Ambulatory Visit (INDEPENDENT_AMBULATORY_CARE_PROVIDER_SITE_OTHER): Payer: Medicare Other

## 2014-12-10 DIAGNOSIS — E7800 Pure hypercholesterolemia, unspecified: Secondary | ICD-10-CM

## 2014-12-10 DIAGNOSIS — I10 Essential (primary) hypertension: Secondary | ICD-10-CM | POA: Diagnosis not present

## 2014-12-10 DIAGNOSIS — R739 Hyperglycemia, unspecified: Secondary | ICD-10-CM

## 2014-12-10 LAB — BASIC METABOLIC PANEL
BUN: 13 mg/dL (ref 6–23)
CO2: 26 mEq/L (ref 19–32)
Calcium: 9.5 mg/dL (ref 8.4–10.5)
Chloride: 102 mEq/L (ref 96–112)
Creatinine, Ser: 0.75 mg/dL (ref 0.40–1.20)
GFR: 81.47 mL/min (ref >60.00)
Glucose, Bld: 99 mg/dL (ref 70–99)
POTASSIUM: 4.6 meq/L (ref 3.5–5.1)
SODIUM: 134 meq/L — AB (ref 135–145)

## 2014-12-10 LAB — HEPATIC FUNCTION PANEL
ALK PHOS: 47 U/L (ref 39–117)
ALT: 15 U/L (ref 0–35)
AST: 18 U/L (ref 0–37)
Albumin: 4.4 g/dL (ref 3.5–5.2)
Bilirubin, Direct: 0.1 mg/dL (ref 0.0–0.3)
TOTAL PROTEIN: 7.5 g/dL (ref 6.0–8.3)
Total Bilirubin: 0.5 mg/dL (ref 0.2–1.2)

## 2014-12-10 LAB — LIPID PANEL
CHOL/HDL RATIO: 4
Cholesterol: 217 mg/dL — ABNORMAL HIGH (ref 0–200)
HDL: 54.8 mg/dL (ref >39.00)
LDL Cholesterol: 142 mg/dL — ABNORMAL HIGH (ref 0–99)
NonHDL: 162.2
TRIGLYCERIDES: 101 mg/dL (ref 0.0–149.0)
VLDL: 20.2 mg/dL (ref 0.0–40.0)

## 2014-12-10 LAB — HEMOGLOBIN A1C: Hgb A1c MFr Bld: 5.7 % (ref 4.6–6.5)

## 2014-12-11 ENCOUNTER — Other Ambulatory Visit: Payer: Self-pay | Admitting: Internal Medicine

## 2014-12-11 DIAGNOSIS — E871 Hypo-osmolality and hyponatremia: Secondary | ICD-10-CM

## 2014-12-11 DIAGNOSIS — E7800 Pure hypercholesterolemia, unspecified: Secondary | ICD-10-CM

## 2014-12-11 NOTE — Progress Notes (Signed)
Orders placed for f/u labs.  

## 2014-12-25 DIAGNOSIS — L821 Other seborrheic keratosis: Secondary | ICD-10-CM | POA: Diagnosis not present

## 2014-12-25 DIAGNOSIS — L218 Other seborrheic dermatitis: Secondary | ICD-10-CM | POA: Diagnosis not present

## 2015-02-12 DIAGNOSIS — M170 Bilateral primary osteoarthritis of knee: Secondary | ICD-10-CM | POA: Diagnosis not present

## 2015-02-15 ENCOUNTER — Other Ambulatory Visit: Payer: Self-pay | Admitting: Internal Medicine

## 2015-03-04 DIAGNOSIS — E785 Hyperlipidemia, unspecified: Secondary | ICD-10-CM | POA: Diagnosis not present

## 2015-03-04 DIAGNOSIS — G458 Other transient cerebral ischemic attacks and related syndromes: Secondary | ICD-10-CM | POA: Diagnosis not present

## 2015-03-04 DIAGNOSIS — I6529 Occlusion and stenosis of unspecified carotid artery: Secondary | ICD-10-CM | POA: Diagnosis not present

## 2015-03-04 DIAGNOSIS — I739 Peripheral vascular disease, unspecified: Secondary | ICD-10-CM | POA: Diagnosis not present

## 2015-03-04 DIAGNOSIS — I6523 Occlusion and stenosis of bilateral carotid arteries: Secondary | ICD-10-CM | POA: Diagnosis not present

## 2015-03-23 ENCOUNTER — Ambulatory Visit (INDEPENDENT_AMBULATORY_CARE_PROVIDER_SITE_OTHER): Payer: Medicare Other | Admitting: Internal Medicine

## 2015-03-23 ENCOUNTER — Encounter: Payer: Self-pay | Admitting: Internal Medicine

## 2015-03-23 VITALS — BP 110/70 | HR 72 | Temp 98.4°F | Ht 65.5 in | Wt 175.2 lb

## 2015-03-23 DIAGNOSIS — E7800 Pure hypercholesterolemia, unspecified: Secondary | ICD-10-CM

## 2015-03-23 DIAGNOSIS — Z86010 Personal history of colonic polyps: Secondary | ICD-10-CM

## 2015-03-23 DIAGNOSIS — G458 Other transient cerebral ischemic attacks and related syndromes: Secondary | ICD-10-CM

## 2015-03-23 DIAGNOSIS — M810 Age-related osteoporosis without current pathological fracture: Secondary | ICD-10-CM | POA: Diagnosis not present

## 2015-03-23 DIAGNOSIS — I10 Essential (primary) hypertension: Secondary | ICD-10-CM

## 2015-03-23 DIAGNOSIS — Z23 Encounter for immunization: Secondary | ICD-10-CM | POA: Diagnosis not present

## 2015-03-23 DIAGNOSIS — R739 Hyperglycemia, unspecified: Secondary | ICD-10-CM

## 2015-03-23 MED ORDER — LOSARTAN POTASSIUM 50 MG PO TABS: ORAL_TABLET | ORAL | Status: DC

## 2015-03-23 NOTE — Patient Instructions (Signed)

## 2015-03-23 NOTE — Progress Notes (Signed)
Patient ID: Whitney Carr, female   DOB: [birth year], 69 y.o.   MRN: [id number]   Subjective:    Patient ID: Whitney Carr, female    DOB: 23-Apr-1946, 69 y.o.   MRN: [id number]  HPI  Patient with past history of hypertension, hyperglycemia, PVD and hypercholesterolemia.  She comes in today to follow up on these issues.  Tries to stay active.  Just returned from a bus trip.  Plans to get back in her routine of diet and exercise.  No cardiac symptoms with increased activity or exertion.  No sob.  No acid reflux.  No abdominal pain or cramping.  Bowels stable.  Sees Dr Delana Meyer.  Stable.    Past Medical History  Diagnosis Date  . Hypercholesterolemia   . Hypertension   . GERD (gastroesophageal reflux disease)   . Degenerative joint disease    Past Surgical History  Procedure Laterality Date  . Tubal ligation  1969  . Tonsilectomy/adenoidectomy with myringotomy  1970  . Appendectomy  1969  . Vaginal hysterectomy  1987    secondary to bleeding   Family History  Problem Relation Age of Onset  . Congestive Heart Failure Mother   . Thyroid disease Mother   . Heart failure Father   . Diabetes Father   . Heart disease Sister     CABG  . Diabetes Sister   . Diabetes Brother   . Breast cancer Neg Hx   . Colon cancer Neg Hx    Social History   Social History  . Marital Status: Married    Spouse Name: N/A  . Number of Children: 3  . Years of Education: N/A   Social History Main Topics  . Smoking status: Never Smoker   . Smokeless tobacco: Never Used  . Alcohol Use: No  . Drug Use: No  . Sexual Activity: Not Asked   Other Topics Concern  . None   Social History Narrative    Outpatient Encounter Prescriptions as of 03/23/2015  Medication Sig  . alendronate (FOSAMAX) 70 MG tablet Take 1 tablet (70 mg total) by mouth every 7 (seven) days. Take with a full glass of water on an empty stomach.  Marland Kitchen aspirin 81 MG tablet Take 81 mg by mouth daily.  . Calcium 600-200 MG-UNIT  per tablet Take 1 tablet by mouth 2 (two) times daily.  . cholecalciferol (VITAMIN D) 1000 UNITS tablet Take 1,000 Units by mouth daily.  Marland Kitchen losartan (COZAAR) 50 MG tablet TAKE (1) TABLET BY MOUTH DAILY FOR HIGH BLOOD PRESSURE.  . [DISCONTINUED] losartan (COZAAR) 50 MG tablet TAKE (1) TABLET BY MOUTH DAILY FOR HIGH BLOOD PRESSURE.   No facility-administered encounter medications on file as of 03/23/2015.    Review of Systems  Constitutional: Negative for appetite change and unexpected weight change.  HENT: Negative for congestion and sinus pressure.   Eyes: Negative for pain and visual disturbance.  Respiratory: Negative for cough, chest tightness and shortness of breath.   Cardiovascular: Negative for chest pain, palpitations and leg swelling.  Gastrointestinal: Negative for nausea, vomiting, abdominal pain and diarrhea.  Genitourinary: Negative for dysuria and difficulty urinating.  Musculoskeletal: Negative for back pain and joint swelling.  Skin: Negative for color change and rash.  Neurological: Negative for dizziness, light-headedness and headaches.  Psychiatric/Behavioral: Negative for dysphoric mood and agitation.       Objective:    Physical Exam  Constitutional: She appears well-developed and well-nourished. No distress.  HENT:  Nose: Nose normal.  Mouth/Throat:  Oropharynx is clear and moist.  Eyes: Conjunctivae are normal. Right eye exhibits no discharge. Left eye exhibits no discharge.  Neck: Neck supple. No thyromegaly present.  Cardiovascular: Normal rate and regular rhythm.   Pulmonary/Chest: Breath sounds normal. No respiratory distress. She has no wheezes.  Abdominal: Soft. Bowel sounds are normal. There is no tenderness.  Musculoskeletal: She exhibits no edema or tenderness.  Lymphadenopathy:    She has no cervical adenopathy.  Skin: No rash noted. No erythema.  Psychiatric: She has a normal mood and affect. Her behavior is normal.    BP 110/70 mmHg  Pulse  72  Temp(Src) 98.4 F (36.9 C) (Oral)  Ht 5' 5.5" (1.664 m)  Wt 175 lb 4 oz (79.493 kg)  BMI 28.71 kg/m2  SpO2 98%  LMP 06/03/1986 Wt Readings from Last 3 Encounters:  03/23/15 175 lb 4 oz (79.493 kg)  11/12/14 173 lb 2 oz (78.529 kg)  08/28/14 173 lb 6 oz (78.642 kg)     Lab Results  Component Value Date   WBC 6.2 03/06/2014   HGB 13.3 03/06/2014   HCT 40.3 03/06/2014   PLT 305.0 03/06/2014   GLUCOSE 99 12/10/2014   CHOL 217* 12/10/2014   TRIG 101.0 12/10/2014   HDL 54.80 12/10/2014   LDLDIRECT 143.6 05/13/2012   LDLCALC 142* 12/10/2014   ALT 15 12/10/2014   AST 18 12/10/2014   NA 134* 12/10/2014   K 4.6 12/10/2014   CL 102 12/10/2014   CREATININE 0.75 12/10/2014   BUN 13 12/10/2014   CO2 26 12/10/2014   TSH 1.71 03/06/2014   HGBA1C 5.7 12/10/2014       Assessment & Plan:   Problem List Items Addressed This Visit    History of colonic polyps    Colonoscopy 06/17/13 - outlined in overview.  Pathology - polyps - tubular adenomas.  Recommended f/u colonoscopy in five years.        Hypercholesteremia    Did not tolerate pravastatin.  Low cholesterol diet and exercise.  Follow lipid panel.        Relevant Medications   losartan (COZAAR) 50 MG tablet   Other Relevant Orders   Lipid panel   Hepatic function panel   Hyperglycemia    Low carb diet and exercise.  Follow met b and a1c.       Relevant Orders   Hemoglobin L7L   Basic metabolic panel   Hypertension    Blood pressure under good control.  Continue same medication regimen.  Follow pressures.  Follow metabolic panel.        Relevant Medications   losartan (COZAAR) 50 MG tablet   Other Relevant Orders   CBC with Differential/Platelet   Osteoporosis    On fosamax.  Continue calcium and vitamin d.       Relevant Orders   TSH   Subclavian steal syndrome    Is s/p revascularization.  Followed by vascular surgery (Dr Delana Meyer).  Doing well.  Follow.        Relevant Medications   losartan  (COZAAR) 50 MG tablet    Other Visit Diagnoses    Encounter for immunization    -  Primary        Einar Pheasant, MD

## 2015-03-23 NOTE — Progress Notes (Signed)
Pre-visit discussion using our clinic review tool. No additional management support is needed unless otherwise documented below in the visit note.  

## 2015-03-28 ENCOUNTER — Encounter: Payer: Self-pay | Admitting: Internal Medicine

## 2015-03-28 NOTE — Assessment & Plan Note (Signed)
Is s/p revascularization.  Followed by vascular surgery (Dr Delana Meyer).  Doing well.  Follow.

## 2015-03-28 NOTE — Assessment & Plan Note (Signed)
Colonoscopy 06/17/13 - outlined in overview.  Pathology - polyps - tubular adenomas.  Recommended f/u colonoscopy in five years.

## 2015-03-28 NOTE — Assessment & Plan Note (Signed)
Blood pressure under good control.  Continue same medication regimen.  Follow pressures.  Follow metabolic panel.   

## 2015-03-28 NOTE — Assessment & Plan Note (Signed)
On fosamax.  Continue calcium and vitamin d.

## 2015-03-28 NOTE — Assessment & Plan Note (Signed)
Did not tolerate pravastatin.  Low cholesterol diet and exercise.  Follow lipid panel.

## 2015-03-28 NOTE — Assessment & Plan Note (Signed)
Low carb diet and exercise.  Follow met b and a1c.  

## 2015-04-06 ENCOUNTER — Other Ambulatory Visit (INDEPENDENT_AMBULATORY_CARE_PROVIDER_SITE_OTHER): Payer: Medicare Other

## 2015-04-06 DIAGNOSIS — I10 Essential (primary) hypertension: Secondary | ICD-10-CM | POA: Diagnosis not present

## 2015-04-06 DIAGNOSIS — R739 Hyperglycemia, unspecified: Secondary | ICD-10-CM

## 2015-04-06 DIAGNOSIS — E7800 Pure hypercholesterolemia, unspecified: Secondary | ICD-10-CM | POA: Diagnosis not present

## 2015-04-06 DIAGNOSIS — E871 Hypo-osmolality and hyponatremia: Secondary | ICD-10-CM

## 2015-04-06 DIAGNOSIS — M810 Age-related osteoporosis without current pathological fracture: Secondary | ICD-10-CM

## 2015-04-06 LAB — LIPID PANEL
CHOL/HDL RATIO: 4
Cholesterol: 197 mg/dL (ref 0–200)
HDL: 55.8 mg/dL (ref >39.00)
LDL CALC: 118 mg/dL — AB (ref 0–99)
NonHDL: 140.96
TRIGLYCERIDES: 117 mg/dL (ref 0.0–149.0)
VLDL: 23.4 mg/dL (ref 0.0–40.0)

## 2015-04-06 LAB — CBC WITH DIFFERENTIAL/PLATELET
BASOS ABS: 0 10*3/uL (ref 0.0–0.1)
Basophils Relative: 0.3 % (ref 0.0–3.0)
Eosinophils Absolute: 0.1 10*3/uL (ref 0.0–0.7)
Eosinophils Relative: 1.4 % (ref 0.0–5.0)
HCT: 43.2 % (ref 36.0–46.0)
HEMOGLOBIN: 14 g/dL (ref 12.0–15.0)
LYMPHS PCT: 24.6 % (ref 12.0–46.0)
Lymphs Abs: 1.9 10*3/uL (ref 0.7–4.0)
MCHC: 32.4 g/dL (ref 30.0–36.0)
MCV: 92.1 fl (ref 78.0–100.0)
MONOS PCT: 6.4 % (ref 3.0–12.0)
Monocytes Absolute: 0.5 10*3/uL (ref 0.1–1.0)
NEUTROS PCT: 67.3 % (ref 43.0–77.0)
Neutro Abs: 5.1 10*3/uL (ref 1.4–7.7)
Platelets: 320 10*3/uL (ref 150.0–400.0)
RBC: 4.69 Mil/uL (ref 3.87–5.11)
RDW: 13.7 % (ref 11.5–15.5)
WBC: 7.6 10*3/uL (ref 4.0–10.5)

## 2015-04-06 LAB — HEPATIC FUNCTION PANEL
ALBUMIN: 4.3 g/dL (ref 3.5–5.2)
ALT: 12 U/L (ref 0–35)
AST: 13 U/L (ref 0–37)
Alkaline Phosphatase: 45 U/L (ref 39–117)
Bilirubin, Direct: 0.1 mg/dL (ref 0.0–0.3)
Total Bilirubin: 0.5 mg/dL (ref 0.2–1.2)
Total Protein: 7.2 g/dL (ref 6.0–8.3)

## 2015-04-06 LAB — BASIC METABOLIC PANEL
BUN: 13 mg/dL (ref 6–23)
CALCIUM: 9.5 mg/dL (ref 8.4–10.5)
CO2: 28 meq/L (ref 19–32)
Chloride: 103 mEq/L (ref 96–112)
Creatinine, Ser: 0.72 mg/dL (ref 0.40–1.20)
GFR: 85.32 mL/min (ref >60.00)
GLUCOSE: 89 mg/dL (ref 70–99)
Potassium: 4.5 mEq/L (ref 3.5–5.1)
SODIUM: 139 meq/L (ref 135–145)

## 2015-04-06 LAB — HEMOGLOBIN A1C: Hgb A1c MFr Bld: 5.8 % (ref 4.6–6.5)

## 2015-04-06 LAB — SODIUM: SODIUM: 139 meq/L (ref 135–145)

## 2015-04-06 LAB — TSH: TSH: 1.25 u[IU]/mL (ref 0.35–4.50)

## 2015-04-07 ENCOUNTER — Encounter: Payer: Self-pay | Admitting: Internal Medicine

## 2015-04-12 NOTE — Telephone Encounter (Signed)
Unread mychart message mailed to patient 

## 2015-07-26 ENCOUNTER — Encounter: Payer: Self-pay | Admitting: Internal Medicine

## 2015-07-26 ENCOUNTER — Ambulatory Visit (INDEPENDENT_AMBULATORY_CARE_PROVIDER_SITE_OTHER): Payer: Medicare Other | Admitting: Internal Medicine

## 2015-07-26 VITALS — BP 134/68 | HR 77 | Temp 98.3°F | Ht 65.5 in | Wt 177.5 lb

## 2015-07-26 DIAGNOSIS — E7800 Pure hypercholesterolemia, unspecified: Secondary | ICD-10-CM

## 2015-07-26 DIAGNOSIS — G458 Other transient cerebral ischemic attacks and related syndromes: Secondary | ICD-10-CM

## 2015-07-26 DIAGNOSIS — R0989 Other specified symptoms and signs involving the circulatory and respiratory systems: Secondary | ICD-10-CM

## 2015-07-26 DIAGNOSIS — I10 Essential (primary) hypertension: Secondary | ICD-10-CM | POA: Diagnosis not present

## 2015-07-26 DIAGNOSIS — R739 Hyperglycemia, unspecified: Secondary | ICD-10-CM

## 2015-07-26 DIAGNOSIS — Z86010 Personal history of colonic polyps: Secondary | ICD-10-CM

## 2015-07-26 DIAGNOSIS — M26609 Unspecified temporomandibular joint disorder, unspecified side: Secondary | ICD-10-CM

## 2015-07-26 NOTE — Progress Notes (Signed)
Pre visit review using our clinic review tool, if applicable. No additional management support is needed unless otherwise documented below in the visit note. 

## 2015-07-26 NOTE — Progress Notes (Signed)
Patient ID: Whitney Carr, female   DOB: December 13, 1945, 70 y.o.   MRN: [id number]   Subjective:    Patient ID: Whitney Carr, female    DOB: 11-12-1945, 70 y.o.   MRN: [id number]  HPI  Patient with past history of hypercholesterolemia, GERD, subclavian steal syndrome and hypertension.  She comes in today to follow up on these issues.  She also reports she has been having an earache for the last week.  Better today. She reports she clinches her teeth.  Pain actually appears to be localized right angle of jaw.  Taking alleve.  Better.  No headache.  No sinus issues.  No chest pain or tightness.  No sob.  No acid reflux.  No abdominal pain or cramping.  Bowels stable.  States her blood pressure has been doing well.  Sees vascular surgery.    Past Medical History  Diagnosis Date  . Hypercholesterolemia   . Hypertension   . GERD (gastroesophageal reflux disease)   . Degenerative joint disease    Past Surgical History  Procedure Laterality Date  . Tubal ligation  1969  . Tonsilectomy/adenoidectomy with myringotomy  1970  . Appendectomy  1969  . Vaginal hysterectomy  1987    secondary to bleeding   Family History  Problem Relation Age of Onset  . Congestive Heart Failure Mother   . Thyroid disease Mother   . Heart failure Father   . Diabetes Father   . Heart disease Sister     CABG  . Diabetes Sister   . Diabetes Brother   . Breast cancer Neg Hx   . Colon cancer Neg Hx    Social History   Social History  . Marital Status: Married    Spouse Name: N/A  . Number of Children: 3  . Years of Education: N/A   Social History Main Topics  . Smoking status: Never Smoker   . Smokeless tobacco: Never Used  . Alcohol Use: No  . Drug Use: No  . Sexual Activity: Not Asked   Other Topics Concern  . None   Social History Narrative    Outpatient Encounter Prescriptions as of 07/26/2015  Medication Sig  . alendronate (FOSAMAX) 70 MG tablet Take 1 tablet (70 mg total) by mouth  every 7 (seven) days. Take with a full glass of water on an empty stomach.  Marland Kitchen aspirin 81 MG tablet Take 81 mg by mouth daily.  . Calcium 600-200 MG-UNIT per tablet Take 1 tablet by mouth 2 (two) times daily.  . cholecalciferol (VITAMIN D) 1000 UNITS tablet Take 1,000 Units by mouth daily.  Marland Kitchen losartan (COZAAR) 50 MG tablet TAKE (1) TABLET BY MOUTH DAILY FOR HIGH BLOOD PRESSURE.   No facility-administered encounter medications on file as of 07/26/2015.    Review of Systems  Constitutional: Negative for appetite change and unexpected weight change.  HENT: Negative for congestion and sinus pressure.        Initially described as earache.  Pain more at angle of jaw. No tooth or gum issues.    Eyes: Negative for discharge and redness.  Respiratory: Negative for cough, chest tightness and shortness of breath.   Cardiovascular: Negative for chest pain, palpitations and leg swelling.  Gastrointestinal: Negative for nausea, vomiting, abdominal pain and diarrhea.  Genitourinary: Negative for dysuria and difficulty urinating.  Musculoskeletal: Negative for back pain and joint swelling.  Skin: Negative for color change and rash.  Neurological: Negative for dizziness, light-headedness and headaches.  Psychiatric/Behavioral: Negative for  dysphoric mood and agitation.       Objective:     Blood pressure rechecked by me:  134/68  Physical Exam  Constitutional: She appears well-developed and well-nourished. No distress.  HENT:  Nose: Nose normal.  Mouth/Throat: Oropharynx is clear and moist.  Minimal tenderness to palpation over angle of jaw.  No significant pain.    Eyes: Conjunctivae are normal. Right eye exhibits no discharge. Left eye exhibits no discharge.  Neck: Neck supple. No thyromegaly present.  Cardiovascular: Normal rate and regular rhythm.   Pulmonary/Chest: Breath sounds normal. No respiratory distress. She has no wheezes.  Abdominal: Soft. Bowel sounds are normal. There is no  tenderness.  Musculoskeletal: She exhibits no edema or tenderness.  Lymphadenopathy:    She has no cervical adenopathy.  Skin: No rash noted. No erythema.  Psychiatric: She has a normal mood and affect. Her behavior is normal.    BP 134/68 mmHg  Pulse 77  Temp(Src) 98.3 F (36.8 C) (Oral)  Ht 5' 5.5" (1.664 m)  Wt 177 lb 8 oz (80.513 kg)  BMI 29.08 kg/m2  SpO2 98%  LMP 06/03/1986 Wt Readings from Last 3 Encounters:  07/26/15 177 lb 8 oz (80.513 kg)  03/23/15 175 lb 4 oz (79.493 kg)  11/12/14 173 lb 2 oz (78.529 kg)     Lab Results  Component Value Date   WBC 7.6 04/06/2015   HGB 14.0 04/06/2015   HCT 43.2 04/06/2015   PLT 320.0 04/06/2015   GLUCOSE 89 04/06/2015   CHOL 197 04/06/2015   TRIG 117.0 04/06/2015   HDL 55.80 04/06/2015   LDLDIRECT 143.6 05/13/2012   LDLCALC 118* 04/06/2015   ALT 12 04/06/2015   AST 13 04/06/2015   NA 139 04/06/2015   NA 139 04/06/2015   K 4.5 04/06/2015   CL 103 04/06/2015   CREATININE 0.72 04/06/2015   BUN 13 04/06/2015   CO2 28 04/06/2015   TSH 1.25 04/06/2015   HGBA1C 5.8 04/06/2015       Assessment & Plan:   Problem List Items Addressed This Visit    Carotid bruit    Followed by vascular surgery.        History of colonic polyps    Colonoscopy 06/17/13 - outlined in overview.  Recommended f/u colonoscopy in five years.        Hypercholesteremia    Low cholesterol diet and exercise.  Follow lipid panel.  Did not tolerate pravastatin.        Relevant Orders   Lipid panel   Hepatic function panel   Hyperglycemia    Low carb diet and exercise.  Follow met b and a1c.        Relevant Orders   Hemoglobin A1c   Hypertension    Blood pressure on recheck improved.  Continue same medication regimen.  Follow pressures.  Follow metabolic panel.        Relevant Orders   Basic metabolic panel   Subclavian steal syndrome - Primary    Is s/p revascularization.  Followed by vascular surgery (Dr Delana Meyer).  Doing well.  Has  f/u with vascular surgery 11/2015.        TMJ (temporomandibular joint disorder)    Clicking of jaw on exam.  Some pain to palpation.  No tooth or gum issues.  Taking alleve.  Has helped.  Dentist evaluation for question need of mouth guard.            Einar Pheasant, MD

## 2015-08-02 ENCOUNTER — Encounter: Payer: Self-pay | Admitting: Internal Medicine

## 2015-08-02 DIAGNOSIS — M26609 Unspecified temporomandibular joint disorder, unspecified side: Secondary | ICD-10-CM | POA: Insufficient documentation

## 2015-08-02 NOTE — Assessment & Plan Note (Signed)
Blood pressure on recheck improved.  Continue same medication regimen.  Follow pressures.  Follow metabolic panel.   

## 2015-08-02 NOTE — Assessment & Plan Note (Signed)
Is s/p revascularization.  Followed by vascular surgery (Dr Delana Meyer).  Doing well.  Has f/u with vascular surgery 11/2015.

## 2015-08-02 NOTE — Assessment & Plan Note (Signed)
Colonoscopy 06/17/13 - outlined in overview.  Recommended f/u colonoscopy in five years.

## 2015-08-02 NOTE — Assessment & Plan Note (Signed)
Low carb diet and exercise.  Follow met b and a1c.

## 2015-08-02 NOTE — Assessment & Plan Note (Signed)
Low cholesterol diet and exercise.  Follow lipid panel.  Did not tolerate pravastatin.

## 2015-08-02 NOTE — Assessment & Plan Note (Signed)
Clicking of jaw on exam.  Some pain to palpation.  No tooth or gum issues.  Taking alleve.  Has helped.  Dentist evaluation for question need of mouth guard.

## 2015-08-02 NOTE — Assessment & Plan Note (Signed)
Followed by vascular surgery. 

## 2015-08-13 ENCOUNTER — Other Ambulatory Visit: Payer: Self-pay | Admitting: Internal Medicine

## 2015-09-14 ENCOUNTER — Other Ambulatory Visit: Payer: Medicare Other

## 2015-10-26 ENCOUNTER — Telehealth: Payer: Self-pay | Admitting: *Deleted

## 2015-10-26 ENCOUNTER — Ambulatory Visit
Admission: RE | Admit: 2015-10-26 | Discharge: 2015-10-26 | Disposition: A | Discharge status: Home / Self Care | Payer: Medicare Other | Source: Ambulatory Visit | Attending: Internal Medicine | Admitting: Internal Medicine

## 2015-10-26 ENCOUNTER — Encounter: Payer: Self-pay | Admitting: Internal Medicine

## 2015-10-26 ENCOUNTER — Ambulatory Visit (INDEPENDENT_AMBULATORY_CARE_PROVIDER_SITE_OTHER): Payer: Medicare Other | Admitting: Internal Medicine

## 2015-10-26 VITALS — BP 140/80 | HR 81 | Temp 98.4°F | Resp 18 | Ht 65.5 in | Wt 178.2 lb

## 2015-10-26 DIAGNOSIS — Z1239 Encounter for other screening for malignant neoplasm of breast: Secondary | ICD-10-CM

## 2015-10-26 DIAGNOSIS — E7800 Pure hypercholesterolemia, unspecified: Secondary | ICD-10-CM

## 2015-10-26 DIAGNOSIS — R739 Hyperglycemia, unspecified: Secondary | ICD-10-CM | POA: Diagnosis not present

## 2015-10-26 DIAGNOSIS — I10 Essential (primary) hypertension: Secondary | ICD-10-CM | POA: Diagnosis not present

## 2015-10-26 DIAGNOSIS — R05 Cough: Secondary | ICD-10-CM

## 2015-10-26 DIAGNOSIS — R0602 Shortness of breath: Secondary | ICD-10-CM

## 2015-10-26 DIAGNOSIS — R079 Chest pain, unspecified: Secondary | ICD-10-CM | POA: Diagnosis not present

## 2015-10-26 DIAGNOSIS — G458 Other transient cerebral ischemic attacks and related syndromes: Secondary | ICD-10-CM

## 2015-10-26 DIAGNOSIS — R059 Cough, unspecified: Secondary | ICD-10-CM

## 2015-10-26 LAB — LIPID PANEL
Cholesterol: 219 mg/dL — ABNORMAL HIGH (ref 0–200)
HDL: 53.8 mg/dL (ref >39.00)
LDL Cholesterol: 143 mg/dL — ABNORMAL HIGH (ref 0–99)
NONHDL: 164.81
Total CHOL/HDL Ratio: 4
Triglycerides: 107 mg/dL (ref 0.0–149.0)
VLDL: 21.4 mg/dL (ref 0.0–40.0)

## 2015-10-26 LAB — HEPATIC FUNCTION PANEL
ALBUMIN: 4.4 g/dL (ref 3.5–5.2)
ALK PHOS: 44 U/L (ref 39–117)
ALT: 14 U/L (ref 0–35)
AST: 18 U/L (ref 0–37)
Bilirubin, Direct: 0.1 mg/dL (ref 0.0–0.3)
Total Bilirubin: 0.6 mg/dL (ref 0.2–1.2)
Total Protein: 7.5 g/dL (ref 6.0–8.3)

## 2015-10-26 LAB — BASIC METABOLIC PANEL
BUN: 18 mg/dL (ref 6–23)
CHLORIDE: 102 meq/L (ref 96–112)
CO2: 28 meq/L (ref 19–32)
Calcium: 10 mg/dL (ref 8.4–10.5)
Creatinine, Ser: 0.8 mg/dL (ref 0.40–1.20)
GFR: 75.43 mL/min (ref >60.00)
GLUCOSE: 94 mg/dL (ref 70–99)
POTASSIUM: 4.4 meq/L (ref 3.5–5.1)
Sodium: 137 mEq/L (ref 135–145)

## 2015-10-26 LAB — HEMOGLOBIN A1C: HEMOGLOBIN A1C: 6 % (ref 4.6–6.5)

## 2015-10-26 MED ORDER — LOSARTAN POTASSIUM 50 MG PO TABS: ORAL_TABLET | ORAL | Status: DC

## 2015-10-26 NOTE — Telephone Encounter (Signed)
Please advise a f/u appt slot in 4 weeks the week of may to June. thanks

## 2015-10-26 NOTE — Telephone Encounter (Signed)
Can schedule at 12:00 on 11/23/15.

## 2015-10-26 NOTE — Telephone Encounter (Signed)
Left a message to call office to get scheduled.

## 2015-10-26 NOTE — Progress Notes (Signed)
Patient ID: Whitney Carr, female   DOB: [birth year], 70 y.o.   MRN: [id number]   Subjective:    Patient ID: Whitney Carr, female    DOB: 07/03/1945, 70 y.o.   MRN: [id number]  HPI  Patient here for a scheduled follow up.  She had noticed some increased cough.  Clear mucus production.  This is better.  She does report that she has been noticing some sob with exertion.  Mainly notices with going up stairs.  Some left anterior chest discomfort at times.  Also previously noted elevated blood pressure.  Appears to be better.  Previous decreased appetite.  This has improved.  No abdominal pain or cramping.  Bowels stable.     Past Medical History  Diagnosis Date  . Hypercholesterolemia   . Hypertension   . GERD (gastroesophageal reflux disease)   . Degenerative joint disease    Past Surgical History  Procedure Laterality Date  . Tubal ligation  1969  . Tonsilectomy/adenoidectomy with myringotomy  1970  . Appendectomy  1969  . Vaginal hysterectomy  1987    secondary to bleeding   Family History  Problem Relation Age of Onset  . Congestive Heart Failure Mother   . Thyroid disease Mother   . Heart failure Father   . Diabetes Father   . Heart disease Sister     CABG  . Diabetes Sister   . Diabetes Brother   . Breast cancer Neg Hx   . Colon cancer Neg Hx    Social History   Social History  . Marital Status: Married    Spouse Name: N/A  . Number of Children: 3  . Years of Education: N/A   Social History Main Topics  . Smoking status: Never Smoker   . Smokeless tobacco: Never Used  . Alcohol Use: No  . Drug Use: No  . Sexual Activity: Not Asked   Other Topics Concern  . None   Social History Narrative    Outpatient Encounter Prescriptions as of 10/26/2015  Medication Sig  . alendronate (FOSAMAX) 70 MG tablet Take 1 tablet (70 mg total) by mouth every 7 (seven) days. Take with a full glass of water on an empty stomach.  Marland Kitchen aspirin 81 MG tablet Take 81 mg by mouth  daily.  . Calcium 600-200 MG-UNIT per tablet Take 1 tablet by mouth 2 (two) times daily.  . cholecalciferol (VITAMIN D) 1000 UNITS tablet Take 1,000 Units by mouth daily.  Marland Kitchen losartan (COZAAR) 50 MG tablet TAKE (1) TABLET BY MOUTH DAILY FOR HIGH BLOOD PRESSURE.  . [DISCONTINUED] losartan (COZAAR) 50 MG tablet TAKE (1) TABLET BY MOUTH DAILY FOR HIGH BLOOD PRESSURE.   No facility-administered encounter medications on file as of 10/26/2015.    Review of Systems  Constitutional: Negative for appetite change and unexpected weight change.  HENT: Negative for congestion and sinus pressure.   Respiratory: Positive for cough and shortness of breath (noticed sob with exertion.  noticed with going up stairs.  ). Negative for chest tightness.   Cardiovascular: Positive for chest pain. Negative for palpitations.       No increased edema.   Gastrointestinal: Negative for nausea, vomiting, abdominal pain and diarrhea.  Genitourinary: Negative for dysuria and difficulty urinating.  Musculoskeletal: Negative for back pain and joint swelling.  Skin: Negative for color change and rash.  Neurological: Negative for dizziness, light-headedness and headaches.  Psychiatric/Behavioral: Negative for dysphoric mood and agitation.       Objective:  Physical Exam  Constitutional: She appears well-developed and well-nourished. No distress.  HENT:  Nose: Nose normal.  Mouth/Throat: Oropharynx is clear and moist.  Neck: Neck supple. No thyromegaly present.  Cardiovascular: Normal rate and regular rhythm.   Pulmonary/Chest: Breath sounds normal. No respiratory distress. She has no wheezes.  Abdominal: Soft. Bowel sounds are normal. There is no tenderness.  Musculoskeletal: She exhibits no edema or tenderness.  Lymphadenopathy:    She has no cervical adenopathy.  Skin: No rash noted. No erythema.  Psychiatric: She has a normal mood and affect. Her behavior is normal.    BP 140/80 mmHg  Pulse 81  Temp(Src)  98.4 F (36.9 C) (Oral)  Resp 18  Ht 5' 5.5" (1.664 m)  Wt 178 lb 4 oz (80.854 kg)  BMI 29.20 kg/m2  SpO2 96%  LMP 06/03/1986 Wt Readings from Last 3 Encounters:  10/26/15 178 lb 4 oz (80.854 kg)  07/26/15 177 lb 8 oz (80.513 kg)  03/23/15 175 lb 4 oz (79.493 kg)     Lab Results  Component Value Date   WBC 7.6 04/06/2015   HGB 14.0 04/06/2015   HCT 43.2 04/06/2015   PLT 320.0 04/06/2015   GLUCOSE 94 10/26/2015   CHOL 219* 10/26/2015   TRIG 107.0 10/26/2015   HDL 53.80 10/26/2015   LDLDIRECT 143.6 05/13/2012   LDLCALC 143* 10/26/2015   ALT 14 10/26/2015   AST 18 10/26/2015   NA 137 10/26/2015   K 4.4 10/26/2015   CL 102 10/26/2015   CREATININE 0.80 10/26/2015   BUN 18 10/26/2015   CO2 28 10/26/2015   TSH 1.25 04/06/2015   HGBA1C 6.0 10/26/2015        Assessment & Plan:   Problem List Items Addressed This Visit    Chest pain - Primary    Symptoms as outlined.  EKG revealed SR with no acute ischemic changes.  Refer to cardiology as outlined.        Relevant Orders   DG Chest 2 View (Completed)   Ambulatory referral to Cardiology   Cough    Appears to be better.  Check cxr as outlined.  Follow.       Hypercholesteremia    Low cholesterol diet and exercise.  Tried statin medication.  Intolerance.  Check lipid panel.       Relevant Medications   losartan (COZAAR) 50 MG tablet   Hyperglycemia    Low carb diet and exercise.  Follow met b and a1c.       Hypertension    Blood pressure on recheck by me improved.  Doing better.  Hold on making changes in her blood pressure medication.  Follow.  Check metabolic panel.        Relevant Medications   losartan (COZAAR) 50 MG tablet   SOB (shortness of breath)    Has noticed increased sob with exertion - especially with going up stairs.  EKG obtained and revealed SR with no acute ischemic changes.  Given risk factors and symptoms, will have cardiology evaluate for question of need for further cardiac w/up.  Also  check cxr.       Relevant Orders   EKG 12-Lead (Completed)   DG Chest 2 View (Completed)   Ambulatory referral to Cardiology   Subclavian steal syndrome    Is s/p revascularization.  Followed by vascular surgery (Dr Delana Meyer).  Doing well.  Has f/u planned 11/2015.        Relevant Medications   losartan (COZAAR) 50 MG  tablet    Other Visit Diagnoses    Screening breast examination        Relevant Orders    MM DIGITAL SCREENING BILATERAL        Einar Pheasant, MD

## 2015-10-26 NOTE — Telephone Encounter (Signed)
Please notify patient and then I can put on the schedule. thanks

## 2015-10-26 NOTE — Progress Notes (Signed)
Pre-visit discussion using our clinic review tool. No additional management support is needed unless otherwise documented below in the visit note.  

## 2015-10-26 NOTE — Telephone Encounter (Signed)
Please advise a place on Dr. Bary Leriche schedule to place a 30 min follow up in 4 weeks.

## 2015-10-27 ENCOUNTER — Encounter: Payer: Self-pay | Admitting: *Deleted

## 2015-10-27 DIAGNOSIS — R0602 Shortness of breath: Secondary | ICD-10-CM | POA: Diagnosis not present

## 2015-10-27 DIAGNOSIS — R072 Precordial pain: Secondary | ICD-10-CM | POA: Diagnosis not present

## 2015-10-27 DIAGNOSIS — E7800 Pure hypercholesterolemia, unspecified: Secondary | ICD-10-CM | POA: Diagnosis not present

## 2015-10-27 DIAGNOSIS — I10 Essential (primary) hypertension: Secondary | ICD-10-CM | POA: Diagnosis not present

## 2015-10-28 NOTE — Telephone Encounter (Signed)
Unread mychart message mailed to patient 

## 2015-11-02 ENCOUNTER — Encounter: Payer: Self-pay | Admitting: Internal Medicine

## 2015-11-02 DIAGNOSIS — R05 Cough: Secondary | ICD-10-CM | POA: Insufficient documentation

## 2015-11-02 DIAGNOSIS — R059 Cough, unspecified: Secondary | ICD-10-CM | POA: Insufficient documentation

## 2015-11-02 NOTE — Assessment & Plan Note (Signed)
Is s/p revascularization.  Followed by vascular surgery (Dr Delana Meyer).  Doing well.  Has f/u planned 11/2015.

## 2015-11-02 NOTE — Assessment & Plan Note (Signed)
Low carb diet and exercise.  Follow met b and a1c.  

## 2015-11-02 NOTE — Assessment & Plan Note (Signed)
Appears to be better.  Check cxr as outlined.  Follow.

## 2015-11-02 NOTE — Assessment & Plan Note (Signed)
Blood pressure on recheck by me improved.  Doing better.  Hold on making changes in her blood pressure medication.  Follow.  Check metabolic panel.

## 2015-11-02 NOTE — Assessment & Plan Note (Signed)
Low cholesterol diet and exercise.  Tried statin medication.  Intolerance.  Check lipid panel.

## 2015-11-02 NOTE — Assessment & Plan Note (Signed)
Symptoms as outlined.  EKG revealed SR with no acute ischemic changes.  Refer to cardiology as outlined.

## 2015-11-02 NOTE — Assessment & Plan Note (Signed)
Has noticed increased sob with exertion - especially with going up stairs.  EKG obtained and revealed SR with no acute ischemic changes.  Given risk factors and symptoms, will have cardiology evaluate for question of need for further cardiac w/up.  Also check cxr.

## 2015-11-03 ENCOUNTER — Other Ambulatory Visit: Payer: Self-pay | Admitting: Internal Medicine

## 2015-11-03 ENCOUNTER — Ambulatory Visit
Admission: RE | Admit: 2015-11-03 | Discharge: 2015-11-03 | Disposition: A | Discharge status: Home / Self Care | Payer: Medicare Other | Source: Ambulatory Visit | Attending: Internal Medicine | Admitting: Internal Medicine

## 2015-11-03 DIAGNOSIS — Z1239 Encounter for other screening for malignant neoplasm of breast: Secondary | ICD-10-CM

## 2015-11-03 DIAGNOSIS — Z1231 Encounter for screening mammogram for malignant neoplasm of breast: Secondary | ICD-10-CM

## 2015-11-11 DIAGNOSIS — R0602 Shortness of breath: Secondary | ICD-10-CM | POA: Diagnosis not present

## 2015-11-23 ENCOUNTER — Ambulatory Visit
Admission: RE | Admit: 2015-11-23 | Discharge: 2015-11-23 | Disposition: A | Discharge status: Home / Self Care | Payer: Medicare Other | Source: Ambulatory Visit | Attending: Internal Medicine | Admitting: Internal Medicine

## 2015-11-23 ENCOUNTER — Encounter: Payer: Self-pay | Admitting: Internal Medicine

## 2015-11-23 ENCOUNTER — Ambulatory Visit (INDEPENDENT_AMBULATORY_CARE_PROVIDER_SITE_OTHER): Payer: Medicare Other | Admitting: Internal Medicine

## 2015-11-23 VITALS — BP 132/64 | HR 92 | Temp 98.3°F | Resp 12 | Ht 66.0 in | Wt 180.0 lb

## 2015-11-23 DIAGNOSIS — M810 Age-related osteoporosis without current pathological fracture: Secondary | ICD-10-CM | POA: Diagnosis not present

## 2015-11-23 DIAGNOSIS — I10 Essential (primary) hypertension: Secondary | ICD-10-CM | POA: Diagnosis not present

## 2015-11-23 DIAGNOSIS — E7800 Pure hypercholesterolemia, unspecified: Secondary | ICD-10-CM | POA: Diagnosis not present

## 2015-11-23 DIAGNOSIS — M542 Cervicalgia: Secondary | ICD-10-CM

## 2015-11-23 DIAGNOSIS — M50322 Other cervical disc degeneration at C5-C6 level: Secondary | ICD-10-CM | POA: Diagnosis not present

## 2015-11-23 DIAGNOSIS — G458 Other transient cerebral ischemic attacks and related syndromes: Secondary | ICD-10-CM

## 2015-11-23 DIAGNOSIS — S199XXA Unspecified injury of neck, initial encounter: Secondary | ICD-10-CM | POA: Diagnosis not present

## 2015-11-23 DIAGNOSIS — R739 Hyperglycemia, unspecified: Secondary | ICD-10-CM

## 2015-11-23 DIAGNOSIS — M50323 Other cervical disc degeneration at C6-C7 level: Secondary | ICD-10-CM | POA: Diagnosis not present

## 2015-11-23 MED ORDER — ROSUVASTATIN CALCIUM 5 MG PO TABS: 5.0000 mg | ORAL_TABLET | Freq: Every day | ORAL | Status: DC

## 2015-11-23 NOTE — Progress Notes (Signed)
Patient ID: Albina Gosney Gaulin, female   DOB: 1945-08-08, 70 y.o.   MRN: [id number]   Subjective:    Patient ID: Beva Remund Mclucas, female    DOB: 18-Jun-1946, 70 y.o.   MRN: [id number]  HPI  Patient here for a scheduled follow up.  She saw cardiology recently.  Had echo.  Felt no further w/up warranted.  treies to stay active.  No cardiac symptoms with increased activity or exertion.  No sob.  No acid reflux.  No abdominal pain or cramping.  Bowels stable.  Increased neck pain and headache.  She feels her headaches are related to her neck.  No dizziness.     Past Medical History  Diagnosis Date  . Hypercholesterolemia   . Hypertension   . GERD (gastroesophageal reflux disease)   . Degenerative joint disease    Past Surgical History  Procedure Laterality Date  . Tubal ligation  1969  . Tonsilectomy/adenoidectomy with myringotomy  1970  . Appendectomy  1969  . Vaginal hysterectomy  1987    secondary to bleeding   Family History  Problem Relation Age of Onset  . Congestive Heart Failure Mother   . Thyroid disease Mother   . Heart failure Father   . Diabetes Father   . Heart disease Sister     CABG  . Diabetes Sister   . Diabetes Brother   . Breast cancer Neg Hx   . Colon cancer Neg Hx    Social History   Social History  . Marital Status: Married    Spouse Name: N/A  . Number of Children: 3  . Years of Education: N/A   Social History Main Topics  . Smoking status: Never Smoker   . Smokeless tobacco: Never Used  . Alcohol Use: No  . Drug Use: No  . Sexual Activity: Not Asked   Other Topics Concern  . None   Social History Narrative    Outpatient Encounter Prescriptions as of 11/23/2015  Medication Sig  . alendronate (FOSAMAX) 70 MG tablet Take 1 tablet (70 mg total) by mouth every 7 (seven) days. Take with a full glass of water on an empty stomach.  Marland Kitchen aspirin 81 MG tablet Take 81 mg by mouth daily.  . Calcium 600-200 MG-UNIT per tablet Take 1 tablet by mouth 2  (two) times daily.  . cholecalciferol (VITAMIN D) 1000 UNITS tablet Take 1,000 Units by mouth daily.  Marland Kitchen losartan (COZAAR) 50 MG tablet TAKE (1) TABLET BY MOUTH DAILY FOR HIGH BLOOD PRESSURE.  . rosuvastatin (CRESTOR) 5 MG tablet Take 1 tablet (5 mg total) by mouth daily.   No facility-administered encounter medications on file as of 11/23/2015.    Review of Systems  Constitutional: Negative for appetite change and unexpected weight change.  HENT: Negative for congestion and sinus pressure.   Respiratory: Negative for cough, chest tightness and shortness of breath.   Cardiovascular: Negative for chest pain, palpitations and leg swelling.  Gastrointestinal: Negative for nausea, vomiting, abdominal pain and diarrhea.  Genitourinary: Negative for dysuria and difficulty urinating.  Musculoskeletal: Negative for back pain and joint swelling.  Skin: Negative for color change and rash.  Neurological: Positive for headaches. Negative for dizziness and light-headedness.  Psychiatric/Behavioral: Negative for dysphoric mood and agitation.       Objective:     Blood pressure rechecked by me:  122/62  Physical Exam  Constitutional: She appears well-developed and well-nourished. No distress.  HENT:  Nose: Nose normal.  Mouth/Throat: Oropharynx is clear and  moist.  Neck: Neck supple. No thyromegaly present.  Cardiovascular: Normal rate and regular rhythm.   Pulmonary/Chest: Breath sounds normal. No respiratory distress. She has no wheezes.  Abdominal: Soft. Bowel sounds are normal. There is no tenderness.  Musculoskeletal: She exhibits no edema or tenderness.  Neck discomfort with rotation of her head.    Lymphadenopathy:    She has no cervical adenopathy.  Skin: No rash noted. No erythema.  Psychiatric: She has a normal mood and affect. Her behavior is normal.    BP 132/64 mmHg  Pulse 92  Temp(Src) 98.3 F (36.8 C) (Oral)  Resp 12  Ht 5' 6"  (1.676 m)  Wt 180 lb (81.647 kg)  BMI  29.07 kg/m2  SpO2 97%  LMP 06/03/1986 Wt Readings from Last 3 Encounters:  11/23/15 180 lb (81.647 kg)  10/26/15 178 lb 4 oz (80.854 kg)  07/26/15 177 lb 8 oz (80.513 kg)     Lab Results  Component Value Date   WBC 7.6 04/06/2015   HGB 14.0 04/06/2015   HCT 43.2 04/06/2015   PLT 320.0 04/06/2015   GLUCOSE 94 10/26/2015   CHOL 219* 10/26/2015   TRIG 107.0 10/26/2015   HDL 53.80 10/26/2015   LDLDIRECT 143.6 05/13/2012   LDLCALC 143* 10/26/2015   ALT 14 10/26/2015   AST 18 10/26/2015   NA 137 10/26/2015   K 4.4 10/26/2015   CL 102 10/26/2015   CREATININE 0.80 10/26/2015   BUN 18 10/26/2015   CO2 28 10/26/2015   TSH 1.25 04/06/2015   HGBA1C 6.0 10/26/2015    Mm Screening Breast Tomo Bilateral  11/04/2015  CLINICAL DATA:  Screening. EXAM: 2D DIGITAL SCREENING BILATERAL MAMMOGRAM WITH CAD AND ADJUNCT TOMO COMPARISON:  Previous exam(s). ACR Breast Density Category c: The breast tissue is heterogeneously dense, which may obscure small masses. FINDINGS: There are no findings suspicious for malignancy. Images were processed with CAD. IMPRESSION: No mammographic evidence of malignancy. A result letter of this screening mammogram will be mailed directly to the patient. RECOMMENDATION: Screening mammogram in one year. (Code:SM-B-01Y) BI-RADS CATEGORY  1: Negative. Electronically Signed   By: Pamelia Hoit M.D.   On: 11/04/2015 07:33       Assessment & Plan:   Problem List Items Addressed This Visit    Hypercholesteremia    Start crestor.  Low cholesterol diet and exercise.  Follow lipid panel and liver function tests.        Relevant Medications   rosuvastatin (CRESTOR) 5 MG tablet   Other Relevant Orders   Hepatic function panel   Hyperglycemia    Low carb diet and exercise.  Follow met b and a1c.       Hypertension    Blood pressure under good control.  Continue same medication regimen.  Follow pressures.  Follow metabolic panel.        Relevant Medications   rosuvastatin  (CRESTOR) 5 MG tablet   Neck pain - Primary    Neck pain as outlined.  She feels contributing to headache.  Check c-spine xray.  Discussed further w/up.   Wants to start with xray.  Follow.       Relevant Orders   DG Cervical Spine 2 or 3 views (Completed)   Osteoporosis    On fosamax.  Weight bearing exercise.        Subclavian steal syndrome    Is s/p revascularization.   Followed by vascular surgery (Dr Delana Meyer).  Planned f/u 11/2015.  Relevant Medications   rosuvastatin (CRESTOR) 5 MG tablet       Einar Pheasant, MD

## 2015-11-23 NOTE — Progress Notes (Signed)
Pre-visit discussion using our clinic review tool. No additional management support is needed unless otherwise documented below in the visit note.  

## 2015-11-24 ENCOUNTER — Other Ambulatory Visit: Payer: Self-pay | Admitting: Internal Medicine

## 2015-11-26 ENCOUNTER — Encounter: Payer: Self-pay | Admitting: Internal Medicine

## 2015-11-28 ENCOUNTER — Encounter: Payer: Self-pay | Admitting: Internal Medicine

## 2015-11-28 NOTE — Assessment & Plan Note (Signed)
Is s/p revascularization.   Followed by vascular surgery (Dr Delana Meyer).  Planned f/u 11/2015.

## 2015-11-28 NOTE — Assessment & Plan Note (Signed)
On fosamax.  Weight bearing exercise.

## 2015-11-28 NOTE — Assessment & Plan Note (Signed)
Blood pressure under good control.  Continue same medication regimen.  Follow pressures.  Follow metabolic panel.   

## 2015-11-28 NOTE — Assessment & Plan Note (Signed)
Neck pain as outlined.  She feels contributing to headache.  Check c-spine xray.  Discussed further w/up.   Wants to start with xray.  Follow.

## 2015-11-28 NOTE — Assessment & Plan Note (Signed)
Low carb diet and exercise.  Follow met b and a1c.  

## 2015-11-28 NOTE — Assessment & Plan Note (Addendum)
Start crestor.  Low cholesterol diet and exercise.  Follow lipid panel and liver function tests.

## 2016-01-04 ENCOUNTER — Other Ambulatory Visit (INDEPENDENT_AMBULATORY_CARE_PROVIDER_SITE_OTHER): Payer: Medicare Other

## 2016-01-04 DIAGNOSIS — E7800 Pure hypercholesterolemia, unspecified: Secondary | ICD-10-CM | POA: Diagnosis not present

## 2016-01-04 LAB — HEPATIC FUNCTION PANEL
ALT: 13 U/L (ref 0–35)
AST: 16 U/L (ref 0–37)
Albumin: 4.4 g/dL (ref 3.5–5.2)
Alkaline Phosphatase: 47 U/L (ref 39–117)
BILIRUBIN DIRECT: 0.1 mg/dL (ref 0.0–0.3)
BILIRUBIN TOTAL: 0.5 mg/dL (ref 0.2–1.2)
Total Protein: 7.7 g/dL (ref 6.0–8.3)

## 2016-01-05 ENCOUNTER — Other Ambulatory Visit: Payer: Self-pay | Admitting: Internal Medicine

## 2016-01-05 ENCOUNTER — Encounter: Payer: Self-pay | Admitting: *Deleted

## 2016-01-05 DIAGNOSIS — I10 Essential (primary) hypertension: Secondary | ICD-10-CM

## 2016-01-05 DIAGNOSIS — R739 Hyperglycemia, unspecified: Secondary | ICD-10-CM

## 2016-01-05 DIAGNOSIS — E7800 Pure hypercholesterolemia, unspecified: Secondary | ICD-10-CM

## 2016-01-05 NOTE — Progress Notes (Signed)
Order placed for f/u labs.  

## 2016-02-08 ENCOUNTER — Other Ambulatory Visit: Payer: Self-pay | Admitting: Internal Medicine

## 2016-02-29 ENCOUNTER — Encounter: Payer: Self-pay | Admitting: Internal Medicine

## 2016-02-29 ENCOUNTER — Other Ambulatory Visit (INDEPENDENT_AMBULATORY_CARE_PROVIDER_SITE_OTHER): Payer: Medicare Other

## 2016-02-29 DIAGNOSIS — E7800 Pure hypercholesterolemia, unspecified: Secondary | ICD-10-CM

## 2016-02-29 DIAGNOSIS — I10 Essential (primary) hypertension: Secondary | ICD-10-CM

## 2016-02-29 DIAGNOSIS — R739 Hyperglycemia, unspecified: Secondary | ICD-10-CM | POA: Diagnosis not present

## 2016-02-29 LAB — HEPATIC FUNCTION PANEL
ALK PHOS: 50 U/L (ref 39–117)
ALT: 11 U/L (ref 0–35)
AST: 14 U/L (ref 0–37)
Albumin: 4.3 g/dL (ref 3.5–5.2)
BILIRUBIN DIRECT: 0.1 mg/dL (ref 0.0–0.3)
Total Bilirubin: 0.4 mg/dL (ref 0.2–1.2)
Total Protein: 7.4 g/dL (ref 6.0–8.3)

## 2016-02-29 LAB — LIPID PANEL
CHOL/HDL RATIO: 3
Cholesterol: 163 mg/dL (ref 0–200)
HDL: 53.1 mg/dL (ref >39.00)
LDL Cholesterol: 89 mg/dL (ref 0–99)
NONHDL: 109.61
TRIGLYCERIDES: 101 mg/dL (ref 0.0–149.0)
VLDL: 20.2 mg/dL (ref 0.0–40.0)

## 2016-02-29 LAB — BASIC METABOLIC PANEL
BUN: 18 mg/dL (ref 6–23)
CHLORIDE: 103 meq/L (ref 96–112)
CO2: 27 mEq/L (ref 19–32)
CREATININE: 0.79 mg/dL (ref 0.40–1.20)
Calcium: 9.3 mg/dL (ref 8.4–10.5)
GFR: 76.46 mL/min (ref >60.00)
GLUCOSE: 93 mg/dL (ref 70–99)
POTASSIUM: 3.9 meq/L (ref 3.5–5.1)
Sodium: 137 mEq/L (ref 135–145)

## 2016-02-29 LAB — HEMOGLOBIN A1C: Hgb A1c MFr Bld: 5.9 % (ref 4.6–6.5)

## 2016-03-01 ENCOUNTER — Encounter: Payer: Self-pay | Admitting: Internal Medicine

## 2016-03-01 ENCOUNTER — Ambulatory Visit (INDEPENDENT_AMBULATORY_CARE_PROVIDER_SITE_OTHER): Payer: Medicare Other | Admitting: Internal Medicine

## 2016-03-01 VITALS — BP 120/60 | HR 73 | Temp 98.3°F | Ht 65.0 in | Wt 177.0 lb

## 2016-03-01 DIAGNOSIS — G458 Other transient cerebral ischemic attacks and related syndromes: Secondary | ICD-10-CM | POA: Diagnosis not present

## 2016-03-01 DIAGNOSIS — Z23 Encounter for immunization: Secondary | ICD-10-CM | POA: Diagnosis not present

## 2016-03-01 DIAGNOSIS — R0989 Other specified symptoms and signs involving the circulatory and respiratory systems: Secondary | ICD-10-CM

## 2016-03-01 DIAGNOSIS — I10 Essential (primary) hypertension: Secondary | ICD-10-CM | POA: Diagnosis not present

## 2016-03-01 DIAGNOSIS — M810 Age-related osteoporosis without current pathological fracture: Secondary | ICD-10-CM | POA: Diagnosis not present

## 2016-03-01 DIAGNOSIS — E7800 Pure hypercholesterolemia, unspecified: Secondary | ICD-10-CM

## 2016-03-01 DIAGNOSIS — Z0000 Encounter for general adult medical examination without abnormal findings: Secondary | ICD-10-CM

## 2016-03-01 DIAGNOSIS — R739 Hyperglycemia, unspecified: Secondary | ICD-10-CM

## 2016-03-01 DIAGNOSIS — Z86010 Personal history of colonic polyps: Secondary | ICD-10-CM
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
## 2016-03-01 NOTE — Progress Notes (Signed)
Pre visit review using our clinic review tool, if applicable. No additional management support is needed unless otherwise documented below in the visit note. 

## 2016-03-05 ENCOUNTER — Encounter: Payer: Self-pay | Admitting: Internal Medicine

## 2016-03-05 NOTE — Assessment & Plan Note (Signed)
Is s/p revascularization.  Followed by vascular surgery (Dr Delana Meyer).  Doing well.

## 2016-03-05 NOTE — Assessment & Plan Note (Signed)
Blood pressure under good control.  Continue same medication regimen.  Follow pressures.  Follow metabolic panel.   

## 2016-03-05 NOTE — Assessment & Plan Note (Signed)
On low dose crestor.  Cholesterol improved.  Follow lipid panel and liver function tests.   Lab Results  Component Value Date   CHOL 163 02/29/2016   HDL 53.10 02/29/2016   LDLCALC 89 02/29/2016   LDLDIRECT 143.6 05/13/2012   TRIG 101.0 02/29/2016   CHOLHDL 3 02/29/2016

## 2016-03-05 NOTE — Assessment & Plan Note (Signed)
Low carb diet and exercise.  Follow met b and a1c.   Lab Results  Component Value Date   HGBA1C 5.9 02/29/2016

## 2016-03-05 NOTE — Assessment & Plan Note (Signed)
Followed by vascular surgery. 

## 2016-03-05 NOTE — Assessment & Plan Note (Signed)
On fosamax.  Weight bearing exercise.

## 2016-03-05 NOTE — Assessment & Plan Note (Signed)
Colonoscopy 12.23.14 as outlined in overview.  Recommended f/u in five years.

## 2016-03-10 ENCOUNTER — Other Ambulatory Visit: Payer: Self-pay | Admitting: Internal Medicine

## 2016-03-15 DIAGNOSIS — G458 Other transient cerebral ischemic attacks and related syndromes: Secondary | ICD-10-CM | POA: Diagnosis not present

## 2016-03-15 DIAGNOSIS — I6523 Occlusion and stenosis of bilateral carotid arteries: Secondary | ICD-10-CM | POA: Diagnosis not present

## 2016-03-15 DIAGNOSIS — E785 Hyperlipidemia, unspecified: Secondary | ICD-10-CM | POA: Diagnosis not present

## 2016-03-15 DIAGNOSIS — I10 Essential (primary) hypertension: Secondary | ICD-10-CM | POA: Diagnosis not present

## 2016-03-20 DIAGNOSIS — L570 Actinic keratosis: Secondary | ICD-10-CM | POA: Diagnosis not present

## 2016-03-24 DIAGNOSIS — M170 Bilateral primary osteoarthritis of knee: Secondary | ICD-10-CM | POA: Diagnosis not present

## 2016-04-27 DIAGNOSIS — G458 Other transient cerebral ischemic attacks and related syndromes: Secondary | ICD-10-CM | POA: Diagnosis not present

## 2016-04-27 DIAGNOSIS — I10 Essential (primary) hypertension: Secondary | ICD-10-CM | POA: Diagnosis not present

## 2016-04-27 DIAGNOSIS — E7800 Pure hypercholesterolemia, unspecified: Secondary | ICD-10-CM | POA: Diagnosis not present

## 2016-06-28 ENCOUNTER — Telehealth: Payer: Self-pay | Admitting: Radiology

## 2016-06-28 DIAGNOSIS — E7800 Pure hypercholesterolemia, unspecified: Secondary | ICD-10-CM

## 2016-06-28 DIAGNOSIS — M810 Age-related osteoporosis without current pathological fracture: Secondary | ICD-10-CM

## 2016-06-28 DIAGNOSIS — I10 Essential (primary) hypertension: Secondary | ICD-10-CM

## 2016-06-28 DIAGNOSIS — R739 Hyperglycemia, unspecified: Secondary | ICD-10-CM

## 2016-06-28 NOTE — Telephone Encounter (Signed)
Pt coming in for labs tomorrow, please place lab orders.

## 2016-06-29 ENCOUNTER — Other Ambulatory Visit: Payer: Medicare Other

## 2016-06-29 NOTE — Telephone Encounter (Signed)
Orders placed for labs

## 2016-06-29 NOTE — Telephone Encounter (Signed)
Lab orders placed.  

## 2016-06-29 NOTE — Addendum Note (Signed)
Addended by: Alisa Graff on: 06/29/2016 04:40 AM   Modules accepted: Orders

## 2016-07-04 ENCOUNTER — Ambulatory Visit (INDEPENDENT_AMBULATORY_CARE_PROVIDER_SITE_OTHER): Payer: Medicare Other | Admitting: Internal Medicine

## 2016-07-04 ENCOUNTER — Encounter: Payer: Self-pay | Admitting: Internal Medicine

## 2016-07-04 VITALS — BP 150/90 | HR 69 | Temp 98.4°F | Resp 16 | Ht 64.75 in | Wt 178.1 lb

## 2016-07-04 DIAGNOSIS — Z860100 Personal history of colon polyps, unspecified: Secondary | ICD-10-CM

## 2016-07-04 DIAGNOSIS — R739 Hyperglycemia, unspecified: Secondary | ICD-10-CM

## 2016-07-04 DIAGNOSIS — M810 Age-related osteoporosis without current pathological fracture: Secondary | ICD-10-CM | POA: Diagnosis not present

## 2016-07-04 DIAGNOSIS — Z0000 Encounter for general adult medical examination without abnormal findings: Secondary | ICD-10-CM

## 2016-07-04 DIAGNOSIS — I10 Essential (primary) hypertension: Secondary | ICD-10-CM | POA: Diagnosis not present

## 2016-07-04 DIAGNOSIS — E7800 Pure hypercholesterolemia, unspecified: Secondary | ICD-10-CM

## 2016-07-04 DIAGNOSIS — G458 Other transient cerebral ischemic attacks and related syndromes: Secondary | ICD-10-CM

## 2016-07-04 LAB — CBC WITH DIFFERENTIAL/PLATELET
BASOS ABS: 0 10*3/uL (ref 0.0–0.1)
Basophils Relative: 0.5 % (ref 0.0–3.0)
Eosinophils Absolute: 0.1 10*3/uL (ref 0.0–0.7)
Eosinophils Relative: 1.1 % (ref 0.0–5.0)
HEMATOCRIT: 41.8 % (ref 36.0–46.0)
HEMOGLOBIN: 14 g/dL (ref 12.0–15.0)
LYMPHS PCT: 19.1 % (ref 12.0–46.0)
Lymphs Abs: 1.3 10*3/uL (ref 0.7–4.0)
MCHC: 33.5 g/dL (ref 30.0–36.0)
MCV: 90.6 fl (ref 78.0–100.0)
MONOS PCT: 7.9 % (ref 3.0–12.0)
Monocytes Absolute: 0.5 10*3/uL (ref 0.1–1.0)
NEUTROS ABS: 4.8 10*3/uL (ref 1.4–7.7)
Neutrophils Relative %: 71.4 % (ref 43.0–77.0)
Platelets: 304 10*3/uL (ref 150.0–400.0)
RBC: 4.61 Mil/uL (ref 3.87–5.11)
RDW: 14 % (ref 11.5–15.5)
WBC: 6.7 10*3/uL (ref 4.0–10.5)

## 2016-07-04 LAB — BASIC METABOLIC PANEL
BUN: 14 mg/dL (ref 6–23)
CALCIUM: 9.8 mg/dL (ref 8.4–10.5)
CO2: 27 mEq/L (ref 19–32)
Chloride: 103 mEq/L (ref 96–112)
Creatinine, Ser: 0.7 mg/dL (ref 0.40–1.20)
GFR: 87.82 mL/min (ref >60.00)
GLUCOSE: 86 mg/dL (ref 70–99)
POTASSIUM: 4.2 meq/L (ref 3.5–5.1)
SODIUM: 140 meq/L (ref 135–145)

## 2016-07-04 LAB — TSH: TSH: 1.42 u[IU]/mL (ref 0.35–4.50)

## 2016-07-04 LAB — LIPID PANEL
CHOL/HDL RATIO: 3
Cholesterol: 177 mg/dL (ref 0–200)
HDL: 59.7 mg/dL (ref >39.00)
LDL CALC: 96 mg/dL (ref 0–99)
NonHDL: 116.85
TRIGLYCERIDES: 105 mg/dL (ref 0.0–149.0)
VLDL: 21 mg/dL (ref 0.0–40.0)

## 2016-07-04 LAB — HEMOGLOBIN A1C: Hgb A1c MFr Bld: 5.8 % (ref 4.6–6.5)

## 2016-07-04 LAB — HEPATIC FUNCTION PANEL
ALBUMIN: 4.5 g/dL (ref 3.5–5.2)
ALT: 14 U/L (ref 0–35)
AST: 14 U/L (ref 0–37)
Alkaline Phosphatase: 48 U/L (ref 39–117)
Bilirubin, Direct: 0.1 mg/dL (ref 0.0–0.3)
TOTAL PROTEIN: 7.3 g/dL (ref 6.0–8.3)
Total Bilirubin: 0.7 mg/dL (ref 0.2–1.2)

## 2016-07-04 LAB — VITAMIN D 25 HYDROXY (VIT D DEFICIENCY, FRACTURES): VITD: 27.81 ng/mL — AB (ref 30.00–100.00)

## 2016-07-04 NOTE — Progress Notes (Signed)
Pre-visit discussion using our clinic review tool. No additional management support is needed unless otherwise documented below in the visit note.  
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
## 2016-07-09 ENCOUNTER — Encounter: Payer: Self-pay | Admitting: Internal Medicine

## 2016-07-09 DIAGNOSIS — Z0000 Encounter for general adult medical examination without abnormal findings: Secondary | ICD-10-CM | POA: Insufficient documentation

## 2016-07-09 NOTE — Assessment & Plan Note (Signed)
Low carb diet and exercise.  Follow met b and a1c.   

## 2016-07-09 NOTE — Assessment & Plan Note (Signed)
-

## 2016-07-09 NOTE — Assessment & Plan Note (Signed)
Blood pressure slightly elevated today.  Recheck better, but still slightly elevated.  Have her spot check her pressure.  Get her back in soon to reassess.  Hold on changing medication at this time.  Follow.

## 2016-07-09 NOTE — Assessment & Plan Note (Signed)
Physical today 07/04/16.  Mammogram 11/04/15 - Birads I.  Colonoscopy 06/17/13 - recommended f/u colonoscopy in 5 years.

## 2016-07-09 NOTE — Assessment & Plan Note (Signed)
Colonoscopy 06/17/13 as outlined.  Recommended f/u colonoscopy in 5 years.

## 2016-07-09 NOTE — Assessment & Plan Note (Signed)
S/p revascularization.  Followed by vascular surgery (Dr Delana Meyer).  Continue risk factor modification.

## 2016-07-09 NOTE — Assessment & Plan Note (Signed)
Low cholesterol diet and exercise.  Crestor.  Follow lipid panel and liver function tests.   

## 2016-07-25 DIAGNOSIS — L218 Other seborrheic dermatitis: Secondary | ICD-10-CM | POA: Diagnosis not present

## 2016-08-08 ENCOUNTER — Other Ambulatory Visit: Payer: Self-pay | Admitting: Internal Medicine

## 2016-08-16 DIAGNOSIS — L680 Hirsutism: Secondary | ICD-10-CM | POA: Diagnosis not present

## 2016-08-16 DIAGNOSIS — Z872 Personal history of diseases of the skin and subcutaneous tissue: Secondary | ICD-10-CM | POA: Diagnosis not present

## 2016-08-24 DIAGNOSIS — M170 Bilateral primary osteoarthritis of knee: Secondary | ICD-10-CM | POA: Diagnosis not present

## 2016-08-28 ENCOUNTER — Ambulatory Visit (INDEPENDENT_AMBULATORY_CARE_PROVIDER_SITE_OTHER): Payer: Medicare Other | Admitting: Internal Medicine

## 2016-08-28 ENCOUNTER — Encounter: Payer: Self-pay | Admitting: Internal Medicine

## 2016-08-28 DIAGNOSIS — E7800 Pure hypercholesterolemia, unspecified: Secondary | ICD-10-CM | POA: Diagnosis not present

## 2016-08-28 DIAGNOSIS — I10 Essential (primary) hypertension: Secondary | ICD-10-CM | POA: Diagnosis not present

## 2016-08-28 DIAGNOSIS — G458 Other transient cerebral ischemic attacks and related syndromes: Secondary | ICD-10-CM

## 2016-08-28 DIAGNOSIS — R739 Hyperglycemia, unspecified: Secondary | ICD-10-CM

## 2016-08-28 MED ORDER — ALENDRONATE SODIUM 70 MG PO TABS: 70.0000 mg | ORAL_TABLET | ORAL | 11 refills | Status: DC

## 2016-08-28 MED ORDER — LOSARTAN POTASSIUM 50 MG PO TABS: ORAL_TABLET | ORAL | 3 refills | Status: DC

## 2016-08-28 MED ORDER — ROSUVASTATIN CALCIUM 5 MG PO TABS: 5.0000 mg | ORAL_TABLET | Freq: Every day | ORAL | 5 refills | Status: DC

## 2016-08-28 NOTE — Progress Notes (Signed)
Pre-visit discussion using our clinic review tool. No additional management support is needed unless otherwise documented below in the visit note.  
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
## 2016-09-03 ENCOUNTER — Encounter: Payer: Self-pay | Admitting: Internal Medicine

## 2016-09-03 NOTE — Assessment & Plan Note (Signed)
Low carb diet and exercise.  Follow met b and a1c.   

## 2016-09-03 NOTE — Assessment & Plan Note (Signed)
Low cholesterol diet and exercise.  On crestor.  Tolerating.  Follow lipid panel and liver function tests.   

## 2016-09-03 NOTE — Assessment & Plan Note (Signed)
S/p revascularization.  Followed by Dr Schnier.  Continue risk factor modification.   

## 2016-09-03 NOTE — Assessment & Plan Note (Signed)
Blood pressure under good control.  Continue same medication regimen.  Follow pressures.  Follow metabolic panel.   

## 2016-10-31 DIAGNOSIS — I10 Essential (primary) hypertension: Secondary | ICD-10-CM | POA: Diagnosis not present

## 2016-10-31 DIAGNOSIS — G458 Other transient cerebral ischemic attacks and related syndromes: Secondary | ICD-10-CM | POA: Diagnosis not present

## 2016-10-31 DIAGNOSIS — E7800 Pure hypercholesterolemia, unspecified: Secondary | ICD-10-CM | POA: Diagnosis not present

## 2016-11-30 ENCOUNTER — Other Ambulatory Visit (INDEPENDENT_AMBULATORY_CARE_PROVIDER_SITE_OTHER): Payer: Medicare Other

## 2016-11-30 DIAGNOSIS — I10 Essential (primary) hypertension: Secondary | ICD-10-CM

## 2016-11-30 DIAGNOSIS — R739 Hyperglycemia, unspecified: Secondary | ICD-10-CM | POA: Diagnosis not present

## 2016-11-30 DIAGNOSIS — E7800 Pure hypercholesterolemia, unspecified: Secondary | ICD-10-CM | POA: Diagnosis not present

## 2016-11-30 LAB — BASIC METABOLIC PANEL WITH GFR
BUN: 18 mg/dL (ref 6–23)
CO2: 27 meq/L (ref 19–32)
Calcium: 9.6 mg/dL (ref 8.4–10.5)
Chloride: 101 meq/L (ref 96–112)
Creatinine, Ser: 0.85 mg/dL (ref 0.40–1.20)
GFR: 70.11 mL/min
Glucose, Bld: 99 mg/dL (ref 70–99)
Potassium: 4.6 meq/L (ref 3.5–5.1)
Sodium: 134 meq/L — ABNORMAL LOW (ref 135–145)

## 2016-11-30 LAB — HEPATIC FUNCTION PANEL
ALT: 13 U/L (ref 0–35)
AST: 13 U/L (ref 0–37)
Albumin: 4.3 g/dL (ref 3.5–5.2)
Alkaline Phosphatase: 42 U/L (ref 39–117)
Bilirubin, Direct: 0.1 mg/dL (ref 0.0–0.3)
Total Bilirubin: 0.6 mg/dL (ref 0.2–1.2)
Total Protein: 7.2 g/dL (ref 6.0–8.3)

## 2016-11-30 LAB — HEMOGLOBIN A1C: HEMOGLOBIN A1C: 5.9 % (ref 4.6–6.5)

## 2016-11-30 LAB — LIPID PANEL
Cholesterol: 170 mg/dL (ref 0–200)
HDL: 50.7 mg/dL (ref >39.00)
LDL Cholesterol: 96 mg/dL (ref 0–99)
NONHDL: 119.29
Total CHOL/HDL Ratio: 3
Triglycerides: 117 mg/dL (ref 0.0–149.0)
VLDL: 23.4 mg/dL (ref 0.0–40.0)

## 2016-12-01 ENCOUNTER — Encounter: Payer: Self-pay | Admitting: Internal Medicine

## 2016-12-01 ENCOUNTER — Ambulatory Visit (INDEPENDENT_AMBULATORY_CARE_PROVIDER_SITE_OTHER): Payer: Medicare Other | Admitting: Internal Medicine

## 2016-12-01 VITALS — BP 138/64 | HR 86 | Temp 98.7°F | Resp 12 | Ht 65.0 in | Wt 172.4 lb

## 2016-12-01 DIAGNOSIS — R0989 Other specified symptoms and signs involving the circulatory and respiratory systems: Secondary | ICD-10-CM

## 2016-12-01 DIAGNOSIS — E871 Hypo-osmolality and hyponatremia: Secondary | ICD-10-CM | POA: Diagnosis not present

## 2016-12-01 DIAGNOSIS — E7800 Pure hypercholesterolemia, unspecified: Secondary | ICD-10-CM

## 2016-12-01 DIAGNOSIS — G458 Other transient cerebral ischemic attacks and related syndromes: Secondary | ICD-10-CM | POA: Diagnosis not present

## 2016-12-01 DIAGNOSIS — R739 Hyperglycemia, unspecified: Secondary | ICD-10-CM

## 2016-12-01 DIAGNOSIS — I10 Essential (primary) hypertension: Secondary | ICD-10-CM

## 2016-12-01 NOTE — Progress Notes (Signed)
Pre-visit discussion using our clinic review tool. No additional management support is needed unless otherwise documented below in the visit note.  
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
## 2016-12-03 ENCOUNTER — Encounter: Payer: Self-pay | Admitting: Internal Medicine

## 2016-12-03 NOTE — Assessment & Plan Note (Signed)
Low carb diet and exercise.  Follow met b and a1c.   

## 2016-12-03 NOTE — Assessment & Plan Note (Signed)
S/p revascularization.  Followed by Dr Schnier.  Continue risk factor modification.   

## 2016-12-03 NOTE — Assessment & Plan Note (Signed)
Low cholesterol diet and exercise.  On crestor.  Follow lipid panel and liver function tests.  

## 2016-12-03 NOTE — Assessment & Plan Note (Signed)
Followed by vascular surgery. 

## 2016-12-03 NOTE — Assessment & Plan Note (Signed)
Blood pressure under good control.  Continue same medication regimen.  Follow pressures.  Follow metabolic panel.   

## 2016-12-07 DIAGNOSIS — L680 Hirsutism: Secondary | ICD-10-CM | POA: Diagnosis not present

## 2016-12-15 ENCOUNTER — Other Ambulatory Visit: Payer: Medicare Other

## 2017-03-20 ENCOUNTER — Ambulatory Visit (INDEPENDENT_AMBULATORY_CARE_PROVIDER_SITE_OTHER): Payer: Medicare Other | Admitting: Internal Medicine

## 2017-03-20 ENCOUNTER — Encounter: Payer: Self-pay | Admitting: Internal Medicine

## 2017-03-20 VITALS — BP 134/72 | HR 91 | Temp 98.8°F | Resp 21 | Wt 172.0 lb

## 2017-03-20 DIAGNOSIS — I10 Essential (primary) hypertension: Secondary | ICD-10-CM

## 2017-03-20 DIAGNOSIS — Z1231 Encounter for screening mammogram for malignant neoplasm of breast: Secondary | ICD-10-CM | POA: Diagnosis not present

## 2017-03-20 DIAGNOSIS — G458 Other transient cerebral ischemic attacks and related syndromes: Secondary | ICD-10-CM

## 2017-03-20 DIAGNOSIS — E7800 Pure hypercholesterolemia, unspecified: Secondary | ICD-10-CM

## 2017-03-20 DIAGNOSIS — R42 Dizziness and giddiness: Secondary | ICD-10-CM | POA: Diagnosis not present

## 2017-03-20 DIAGNOSIS — R739 Hyperglycemia, unspecified: Secondary | ICD-10-CM | POA: Diagnosis not present

## 2017-03-20 DIAGNOSIS — Z1239 Encounter for other screening for malignant neoplasm of breast: Secondary | ICD-10-CM

## 2017-03-20 NOTE — Patient Instructions (Signed)
Saline nasal spray - flush nose at least 2-3x/day  nasacort nasal spray - 2 sprays each nostril one time per day.  Do this in the evening.   mucinex - daily as needed.
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
## 2017-03-21 ENCOUNTER — Ambulatory Visit: Payer: Medicare Other | Admitting: Internal Medicine

## 2017-03-23 ENCOUNTER — Encounter: Payer: Self-pay | Admitting: Internal Medicine

## 2017-03-23 DIAGNOSIS — R42 Dizziness and giddiness: Secondary | ICD-10-CM | POA: Insufficient documentation

## 2017-03-23 NOTE — Assessment & Plan Note (Signed)
Low carb diet and exercise.  Follow met b and a1c.

## 2017-03-23 NOTE — Assessment & Plan Note (Signed)
Blood pressure on recheck by me improved.  Continue same medication regimen.  Follow pressures.  Follow metabolic panel.

## 2017-03-23 NOTE — Assessment & Plan Note (Signed)
Lightheadedness as outlined.  Discussed with her today.  She does have increased congestion and symptoms as outlined.  Treat sinus issues.  Saline nasal spray and nasacort nasal spray as directed.  mucinex as directed.  Hold on abx.  She would like to treat congestion symptoms first and see if the light headedness improves.  Wants to hold on scanning and further testing.  Will call with update.

## 2017-03-23 NOTE — Assessment & Plan Note (Signed)
S/p revascularization.  Followed by Dr Delana Meyer.  Continue risk factor modification.

## 2017-03-23 NOTE — Assessment & Plan Note (Signed)
On crestor.  Low cholesterol diet and exercise.  Follow lipid panel and liver function tests.   

## 2017-03-27 ENCOUNTER — Ambulatory Visit
Admission: RE | Admit: 2017-03-27 | Discharge: 2017-03-27 | Disposition: A | Discharge status: Home / Self Care | Payer: Medicare Other | Source: Ambulatory Visit | Attending: Internal Medicine | Admitting: Internal Medicine

## 2017-03-27 DIAGNOSIS — Z1239 Encounter for other screening for malignant neoplasm of breast: Secondary | ICD-10-CM

## 2017-03-27 DIAGNOSIS — Z1231 Encounter for screening mammogram for malignant neoplasm of breast: Secondary | ICD-10-CM | POA: Insufficient documentation

## 2017-04-02 ENCOUNTER — Ambulatory Visit (INDEPENDENT_AMBULATORY_CARE_PROVIDER_SITE_OTHER): Payer: Medicare Other

## 2017-04-02 ENCOUNTER — Ambulatory Visit: Payer: Medicare Other | Admitting: Internal Medicine

## 2017-04-02 VITALS — BP 134/64 | HR 89 | Temp 98.4°F | Resp 14 | Ht 65.0 in | Wt 171.8 lb

## 2017-04-02 DIAGNOSIS — Z1159 Encounter for screening for other viral diseases: Secondary | ICD-10-CM | POA: Diagnosis not present

## 2017-04-02 DIAGNOSIS — Z0000 Encounter for general adult medical examination without abnormal findings: Secondary | ICD-10-CM | POA: Diagnosis not present

## 2017-04-02 DIAGNOSIS — Z23 Encounter for immunization: Secondary | ICD-10-CM

## 2017-04-02 NOTE — Progress Notes (Signed)
I have reviewed the above note and agree.  Porshea Janowski, M.D.  

## 2017-04-02 NOTE — Patient Instructions (Addendum)
  Ms. Giarrusso , Thank you for taking time to come for your Medicare Wellness Visit. I appreciate your ongoing commitment to your health goals. Please review the following plan we discussed and let me know if I can assist you in the future.   Follow up with Dr. Nicki Reaper as needed.    Bring a copy of your [place] and/or Living Will to be scanned into chart.  Have a great day!  These are the goals we discussed: Goals    . Maintain Healthy Lifestyle          Low carb foods Stay hydrated Stay active       This is a list of the screening recommended for you and due dates:  Health Maintenance  Topic Date Due  .  Hepatitis C: One time screening is recommended by Center for Disease Control  (CDC) for  adults born from [birth year] through 1965.   1946/02/09  . Mammogram  03/27/2018  . Colon Cancer Screening  06/18/2023  . Tetanus Vaccine  02/01/2027  . Flu Shot  Completed  . DEXA scan (bone density measurement)  Completed  . Pneumonia vaccines  Completed

## 2017-04-02 NOTE — Progress Notes (Signed)
Subjective:   Whitney Carr is a 71 y.o. female who presents for an Initial Medicare Annual Wellness Visit.  Review of Systems    No ROS.  Medicare Wellness Visit. Additional risk factors are reflected in the social history.  Cardiac Risk Factors include: advanced age (>76men, >66 women);hypertension     Objective:    Today's Vitals   04/02/17 1418  BP: 134/64  Pulse: 89  Resp: 14  Temp: 98.4 F (36.9 C)  TempSrc: Oral  SpO2: 98%  Weight: 171 lb 12.8 oz (77.9 kg)  Height: 5\' 5"  (1.651 m)   Body mass index is 28.59 kg/m.   Current Medications (verified) Outpatient Encounter Prescriptions as of 04/02/2017  Medication Sig  . alendronate (FOSAMAX) 70 MG tablet Take 1 tablet (70 mg total) by mouth every 7 (seven) days. Take with a full glass of water on an empty stomach.  Marland Kitchen aspirin 81 MG tablet Take 81 mg by mouth daily.  . Calcium 600-200 MG-UNIT per tablet Take 1 tablet by mouth 2 (two) times daily.  . cholecalciferol (VITAMIN D) 1000 UNITS tablet Take 1,000 Units by mouth daily.  Marland Kitchen losartan (COZAAR) 50 MG tablet TAKE (1) TABLET BY MOUTH DAILY FOR HIGH BLOOD PRESSURE.  . rosuvastatin (CRESTOR) 5 MG tablet Take 1 tablet (5 mg total) by mouth daily.  Marland Kitchen SHINGRIX injection    No facility-administered encounter medications on file as of 04/02/2017.     Allergies (verified) Patient has no known allergies.   History: Past Medical History:  Diagnosis Date  . Degenerative joint disease   . GERD (gastroesophageal reflux disease)   . Hypercholesterolemia   . Hypertension    Past Surgical History:  Procedure Laterality Date  . APPENDECTOMY  1969  . TONSILECTOMY/ADENOIDECTOMY WITH MYRINGOTOMY  1970  . TUBAL LIGATION  1969  . VAGINAL HYSTERECTOMY  1987   secondary to bleeding   Family History  Problem Relation Age of Onset  . Congestive Heart Failure Mother   . Thyroid disease Mother   . Heart failure Father   . Diabetes Father   . Heart disease Sister     CABG  . Alzheimer's disease Sister   . Diabetes Sister   . Diabetes Sister   . Stroke Brother   . COPD Brother   . COPD Brother   . Colon cancer Brother   . Hypertension Brother   . Heart disease Brother   . Breast cancer Daughter   . Autoimmune disease Daughter    Social History   Occupational History  . Not on file.   Social History Main Topics  . Smoking status: Never Smoker  . Smokeless tobacco: Never Used  . Alcohol use No  . Drug use: No  . Sexual activity: Yes    Tobacco Counseling Counseling given: Not Answered   Activities of Daily Living In your present state of health, do you have any difficulty performing the following activities: 04/02/2017  Hearing? N  Vision? N  Difficulty concentrating or making decisions? N  Walking or climbing stairs? N  Dressing or bathing? N  Doing errands, shopping? N  Preparing Food and eating ? N  Using the Toilet? N  In the past six months, have you accidently leaked urine? N  Managing your Medications? N  Managing your Finances? N  Housekeeping or managing your Housekeeping? N  Some recent data might be hidden    Immunizations and Health Maintenance Immunization History  Administered Date(s) Administered  . Influenza Split 05/06/2012  .  Influenza, High Dose Seasonal PF 03/23/2015, 03/01/2016, 04/02/2017  . Influenza,inj,Quad PF,6+ Mos 03/09/2014  . Influenza-Unspecified 02/29/2016  . Pneumococcal Conjugate-13 01/20/2014  . Pneumococcal Polysaccharide-23 05/08/2012  . Tdap 01/31/2017   Health Maintenance Due  Topic Date Due  . Hepatitis C Screening  [birth year]    Patient Care Team: Einar Pheasant, MD as PCP - General (Internal Medicine)  Indicate any recent Medical Services you may have received from other than [facility] providers in the past year (date may be approximate).     Assessment:   This is a routine wellness examination for [facility]. The goal of the wellness visit is to assist the patient how to close  the gaps in care and create a preventative care plan for the patient.   The roster of all physicians providing medical care to patient is listed in the Snapshot section of the chart.  Taking calcium VIT D as appropriate/Osteoporosis reviewed.    Safety issues reviewed; Smoke and carbon monoxide detectors in the home. No firearms in the home.  Wears seatbelts when driving or riding with others. Patient does wear sunscreen or protective clothing when in direct sunlight. No violence in the home.  Patient is alert, normal appearance, oriented to person/place/and time.  Correctly identified the president of the [place], recall of 3/3 words, and performing simple calculations. Displays appropriate judgement and can read correct time from watch face.   No new identified risk were noted.  No failures at ADL's or IADL's.    BMI- discussed the importance of a healthy diet, water intake and the benefits of aerobic exercise. Educational material provided.   24 hour diet recall: Breakfast: protein shake Lunch: salad Dinner: lean meat, green vegetable  Daily fluid intake: 2 cups of caffeine, 8 cups of water  Dental- every 6 months.( [place])  Eye- Visual acuity not assessed per patient preference since they have regular follow up with the ophthalmologist.  Wears corrective lenses.  Sleep patterns- Sleeps 7 hours at night.  Wakes feeling rested.  High dose influenza vaccine administered, tolerated well. Educational material provided.  Hepatitis C Screening discussed; future lab ordered.  Educational material provided.  Patient Concerns: None at this time. Follow up with PCP as needed.  Hearing/Vision screen Hearing Screening Comments: Patient is able to hear conversational tones without difficulty.  No issues reported.   Vision Screening Comments: Followed by [facility] Wears corrective lenses when reading Last OV 2017 Cataract extraction, R eye only Visual acuity not assessed per  patient preference since they have regular follow up with the ophthalmologist  Dietary issues and exercise activities discussed: Current Exercise Habits: Home exercise routine, Type of exercise: stretching;treadmill;walking, Time (Minutes): 20, Frequency (Times/Week): 5, Weekly Exercise (Minutes/Week): 100, Intensity: Mild  Goals    . Maintain Healthy Lifestyle          Low carb foods Stay hydrated Stay active      Depression Screen PHQ 2/9 Scores 04/02/2017 07/04/2016 03/01/2016 11/12/2014 01/20/2014 01/05/2013 10/22/2012  PHQ - 2 Score 0 0 0 0 0 0 0    Fall Risk Fall Risk  04/02/2017 07/04/2016 03/01/2016 11/12/2014 01/20/2014  Falls in the past year? No No No No No    Cognitive Function: MMSE - Mini Mental State Exam 04/02/2017  Orientation to time 5  Orientation to Place 5  Registration 3  Attention/ Calculation 5  Recall 3  Language- name 2 objects 2  Language- repeat 1  Language- follow 3 step command 3  Language- read & follow direction  1  Write a sentence 1  Copy design 1  Total score 30        Screening Tests Health Maintenance  Topic Date Due  . Hepatitis C Screening  1945/12/04  . MAMMOGRAM  03/27/2018  . COLONOSCOPY  06/18/2023  . TETANUS/TDAP  02/01/2027  . INFLUENZA VACCINE  Completed  . DEXA SCAN  Completed  . PNA vac Low Risk Adult  Completed      Plan:    End of life planning; Advance aging; Advanced directives discussed. Copy of current HCPOA/Living Will requested.    I have personally reviewed and noted the following in the patient's chart:   . Medical and social history . Use of alcohol, tobacco or illicit drugs  . Current medications and supplements . Functional ability and status . Nutritional status . Physical activity . Advanced directives . List of other physicians . Hospitalizations, surgeries, and ER visits in previous 12 months . Vitals . Screenings to include cognitive, depression, and falls . Referrals and appointments  In  addition, I have reviewed and discussed with patient certain preventive protocols, quality metrics, and best practice recommendations. A written personalized care plan for preventive services as well as general preventive health recommendations were provided to patient.     Varney Biles, LPN   [id number]

## 2017-04-26 DIAGNOSIS — M170 Bilateral primary osteoarthritis of knee: Secondary | ICD-10-CM | POA: Diagnosis not present

## 2017-05-01 ENCOUNTER — Other Ambulatory Visit (INDEPENDENT_AMBULATORY_CARE_PROVIDER_SITE_OTHER): Payer: Medicare Other

## 2017-05-01 DIAGNOSIS — I10 Essential (primary) hypertension: Secondary | ICD-10-CM

## 2017-05-01 DIAGNOSIS — E7800 Pure hypercholesterolemia, unspecified: Secondary | ICD-10-CM | POA: Diagnosis not present

## 2017-05-01 DIAGNOSIS — R739 Hyperglycemia, unspecified: Secondary | ICD-10-CM

## 2017-05-01 DIAGNOSIS — Z1159 Encounter for screening for other viral diseases: Secondary | ICD-10-CM

## 2017-05-01 LAB — BASIC METABOLIC PANEL
BUN: 22 mg/dL (ref 6–23)
CO2: 27 mEq/L (ref 19–32)
CREATININE: 0.77 mg/dL (ref 0.40–1.20)
Calcium: 10 mg/dL (ref 8.4–10.5)
Chloride: 98 mEq/L (ref 96–112)
GFR: 78.49 mL/min (ref >60.00)
Glucose, Bld: 95 mg/dL (ref 70–99)
POTASSIUM: 4.2 meq/L (ref 3.5–5.1)
Sodium: 133 mEq/L — ABNORMAL LOW (ref 135–145)

## 2017-05-01 LAB — HEPATIC FUNCTION PANEL
ALBUMIN: 4.6 g/dL (ref 3.5–5.2)
ALT: 11 U/L (ref 0–35)
AST: 12 U/L (ref 0–37)
Alkaline Phosphatase: 45 U/L (ref 39–117)
Bilirubin, Direct: 0.1 mg/dL (ref 0.0–0.3)
TOTAL PROTEIN: 7.8 g/dL (ref 6.0–8.3)
Total Bilirubin: 0.6 mg/dL (ref 0.2–1.2)

## 2017-05-01 LAB — LIPID PANEL
CHOL/HDL RATIO: 3
CHOLESTEROL: 186 mg/dL (ref 0–200)
HDL: 61.1 mg/dL (ref >39.00)
LDL CALC: 110 mg/dL — AB (ref 0–99)
NonHDL: 124.85
TRIGLYCERIDES: 76 mg/dL (ref 0.0–149.0)
VLDL: 15.2 mg/dL (ref 0.0–40.0)

## 2017-05-01 LAB — HEMOGLOBIN A1C: Hgb A1c MFr Bld: 5.8 % (ref 4.6–6.5)

## 2017-05-02 ENCOUNTER — Other Ambulatory Visit: Payer: Self-pay | Admitting: Internal Medicine

## 2017-05-02 DIAGNOSIS — E871 Hypo-osmolality and hyponatremia: Secondary | ICD-10-CM

## 2017-05-02 LAB — HEPATITIS C ANTIBODY
Hepatitis C Ab: NONREACTIVE
SIGNAL TO CUT-OFF: 0.01 (ref <1.00)

## 2017-05-02 NOTE — Progress Notes (Signed)
Order placed for f/u sodium check.   

## 2017-05-03 ENCOUNTER — Encounter: Payer: Self-pay | Admitting: Internal Medicine

## 2017-05-03 ENCOUNTER — Telehealth: Payer: Self-pay

## 2017-05-03 NOTE — Telephone Encounter (Signed)
Copy of diet mailed.  Copied from [place] #[id number]. Topic: General - Other >> May 03, 2017 10:17 AM Patrice Paradise wrote: Reason for CRM: Pt called requesting another copy of the low calorie diet that was giving to her on her last visit with Dr. Derrel Nip, patient has misplaced her copy. If possible would like it mailed to her.

## 2017-05-16 ENCOUNTER — Other Ambulatory Visit (INDEPENDENT_AMBULATORY_CARE_PROVIDER_SITE_OTHER): Payer: Medicare Other

## 2017-05-16 DIAGNOSIS — R072 Precordial pain: Secondary | ICD-10-CM | POA: Diagnosis not present

## 2017-05-16 DIAGNOSIS — E871 Hypo-osmolality and hyponatremia: Secondary | ICD-10-CM

## 2017-05-16 DIAGNOSIS — G458 Other transient cerebral ischemic attacks and related syndromes: Secondary | ICD-10-CM | POA: Diagnosis not present

## 2017-05-16 DIAGNOSIS — E7800 Pure hypercholesterolemia, unspecified: Secondary | ICD-10-CM | POA: Diagnosis not present

## 2017-05-16 DIAGNOSIS — I10 Essential (primary) hypertension: Secondary | ICD-10-CM | POA: Diagnosis not present

## 2017-05-16 LAB — SODIUM: SODIUM: 137 meq/L (ref 135–145)

## 2017-05-16 NOTE — Addendum Note (Signed)
Addended by: Arby Barrette on: 05/16/2017 01:42 PM   Modules accepted: Orders

## 2017-05-18 ENCOUNTER — Encounter: Payer: Self-pay | Admitting: Internal Medicine

## 2017-07-19 ENCOUNTER — Ambulatory Visit (INDEPENDENT_AMBULATORY_CARE_PROVIDER_SITE_OTHER): Payer: Medicare Other | Admitting: Internal Medicine

## 2017-07-19 ENCOUNTER — Encounter: Payer: Self-pay | Admitting: Internal Medicine

## 2017-07-19 DIAGNOSIS — E7800 Pure hypercholesterolemia, unspecified: Secondary | ICD-10-CM | POA: Diagnosis not present

## 2017-07-19 DIAGNOSIS — R42 Dizziness and giddiness: Secondary | ICD-10-CM

## 2017-07-19 DIAGNOSIS — M810 Age-related osteoporosis without current pathological fracture: Secondary | ICD-10-CM | POA: Diagnosis not present

## 2017-07-19 DIAGNOSIS — Z0000 Encounter for general adult medical examination without abnormal findings: Secondary | ICD-10-CM | POA: Diagnosis not present

## 2017-07-19 DIAGNOSIS — R739 Hyperglycemia, unspecified: Secondary | ICD-10-CM

## 2017-07-19 DIAGNOSIS — R0981 Nasal congestion: Secondary | ICD-10-CM | POA: Diagnosis not present

## 2017-07-19 DIAGNOSIS — G458 Other transient cerebral ischemic attacks and related syndromes: Secondary | ICD-10-CM

## 2017-07-19 DIAGNOSIS — I10 Essential (primary) hypertension: Secondary | ICD-10-CM | POA: Diagnosis not present

## 2017-07-19 MED ORDER — AZELASTINE HCL 0.1 % NA SOLN: 1.0000 | Freq: Two times a day (BID) | NASAL | 1 refills | Status: DC

## 2017-07-19 NOTE — Assessment & Plan Note (Signed)
Physical today 07/19/17.  Mammogram 03/27/17 - Birads I.  Colonoscopy 06/17/13 - recommended f/u in 5 years.

## 2017-07-19 NOTE — Progress Notes (Signed)
Patient ID: Whitney Carr, female   DOB: October 20, 1945, 72 y.o.   MRN: [id number]   Subjective:    Patient ID: Whitney Carr, female    DOB: [birth year], 72 y.o.   MRN: [id number]  HPI  Patient here for her physical exam.  States she is doing relatively well.  Still having persistent congestion and drainage.  A little light headedness associated with these symptoms.  Has tried nasacort, mucinex.  Persistent.  No change.  Discussed trial of astelin.  Also discussed ENT referral. She is in agreement.  States blood pressures doing well.  No chest pain.  No sob.  No acid reflux.  No abdominal pain.  Bowels moving.  States has been released by vascular surgery.     Past Medical History:  Diagnosis Date  . Degenerative joint disease   . GERD (gastroesophageal reflux disease)   . Hypercholesterolemia   . Hypertension    Past Surgical History:  Procedure Laterality Date  . APPENDECTOMY  1969  . TONSILECTOMY/ADENOIDECTOMY WITH MYRINGOTOMY  1970  . TUBAL LIGATION  1969  . VAGINAL HYSTERECTOMY  1987   secondary to bleeding   Family History  Problem Relation Age of Onset  . Congestive Heart Failure Mother   . Thyroid disease Mother   . Heart failure Father   . Diabetes Father   . Heart disease Sister        CABG  . Alzheimer's disease Sister   . Diabetes Sister   . Diabetes Sister   . Stroke Brother   . COPD Brother   . COPD Brother   . Colon cancer Brother   . Hypertension Brother   . Heart disease Brother   . Breast cancer Daughter   . Autoimmune disease Daughter    Social History   Socioeconomic History  . Marital status: Married    Spouse name: None  . Number of children: 3  . Years of education: None  . Highest education level: None  Social Needs  . Financial resource strain: None  . Food insecurity - worry: None  . Food insecurity - inability: None  . Transportation needs - medical: None  . Transportation needs - non-medical: None  Occupational History  .  None  Tobacco Use  . Smoking status: Never Smoker  . Smokeless tobacco: Never Used  Substance and Sexual Activity  . Alcohol use: No    Alcohol/week: 0.0 oz  . Drug use: No  . Sexual activity: Yes  Other Topics Concern  . None  Social History Narrative  . None    Outpatient Encounter Medications as of 07/19/2017  Medication Sig  . alendronate (FOSAMAX) 70 MG tablet Take 1 tablet (70 mg total) by mouth every 7 (seven) days. Take with a full glass of water on an empty stomach.  Marland Kitchen aspirin 81 MG tablet Take 81 mg by mouth daily.  Marland Kitchen azelastine (ASTELIN) 0.1 % nasal spray Place 1 spray into both nostrils 2 (two) times daily. Use in each nostril as directed  . Calcium 600-200 MG-UNIT per tablet Take 1 tablet by mouth 2 (two) times daily.  . cholecalciferol (VITAMIN D) 1000 UNITS tablet Take 1,000 Units by mouth daily.  Marland Kitchen losartan (COZAAR) 50 MG tablet TAKE (1) TABLET BY MOUTH DAILY FOR HIGH BLOOD PRESSURE.  . rosuvastatin (CRESTOR) 5 MG tablet Take 1 tablet (5 mg total) by mouth daily.  Marland Kitchen SHINGRIX injection    No facility-administered encounter medications on file as of 07/19/2017.  Review of Systems  Constitutional: Negative for appetite change and unexpected weight change.  HENT: Positive for congestion and postnasal drip.   Eyes: Negative for pain and visual disturbance.  Respiratory: Negative for cough, chest tightness and shortness of breath.   Cardiovascular: Negative for chest pain, palpitations and leg swelling.  Gastrointestinal: Negative for abdominal pain, diarrhea, nausea and vomiting.  Genitourinary: Negative for difficulty urinating and dysuria.  Musculoskeletal: Negative for joint swelling and myalgias.  Skin: Negative for color change and rash.  Neurological: Positive for light-headedness. Negative for headaches.  Hematological: Negative for adenopathy. Does not bruise/bleed easily.  Psychiatric/Behavioral: Negative for agitation and dysphoric mood.         Objective:    Physical Exam  Constitutional: She is oriented to person, place, and time. She appears well-developed and well-nourished. No distress.  HENT:  Nose: Nose normal.  Mouth/Throat: Oropharynx is clear and moist.  Eyes: Right eye exhibits no discharge. Left eye exhibits no discharge. No scleral icterus.  Neck: Neck supple. No thyromegaly present.  Cardiovascular: Normal rate and regular rhythm.  Pulmonary/Chest: Breath sounds normal. No accessory muscle usage. No tachypnea. No respiratory distress. She has no decreased breath sounds. She has no wheezes. She has no rhonchi. Right breast exhibits no inverted nipple, no mass, no nipple discharge and no tenderness (no axillary adenopathy). Left breast exhibits no inverted nipple, no mass, no nipple discharge and no tenderness (no axilarry adenopathy).  Abdominal: Soft. Bowel sounds are normal. There is no tenderness.  Musculoskeletal: She exhibits no edema or tenderness.  Lymphadenopathy:    She has no cervical adenopathy.  Neurological: She is alert and oriented to person, place, and time.  Skin: Skin is warm. No rash noted. No erythema.  Psychiatric: She has a normal mood and affect. Her behavior is normal.    BP 124/70 (BP Location: Left Arm, Patient Position: Sitting, Cuff Size: Normal)   Pulse 87   Temp 98.8 F (37.1 C) (Oral)   Resp 16   Wt 173 lb 9.6 oz (78.7 kg)   LMP 06/03/1986   SpO2 98%   BMI 28.89 kg/m  Wt Readings from Last 3 Encounters:  07/19/17 173 lb 9.6 oz (78.7 kg)  04/02/17 171 lb 12.8 oz (77.9 kg)  03/20/17 172 lb (78 kg)     Lab Results  Component Value Date   WBC 6.7 07/04/2016   HGB 14.0 07/04/2016   HCT 41.8 07/04/2016   PLT 304.0 07/04/2016   GLUCOSE 95 05/01/2017   CHOL 186 05/01/2017   TRIG 76.0 05/01/2017   HDL 61.10 05/01/2017   LDLDIRECT 143.6 05/13/2012   LDLCALC 110 (H) 05/01/2017   ALT 11 05/01/2017   AST 12 05/01/2017   NA 137 05/16/2017   K 4.2 05/01/2017   CL 98  05/01/2017   CREATININE 0.77 05/01/2017   BUN 22 05/01/2017   CO2 27 05/01/2017   TSH 1.42 07/04/2016   HGBA1C 5.8 05/01/2017    Mm Screening Breast Tomo Bilateral  Result Date: 03/27/2017 CLINICAL DATA:  Screening. EXAM: 2D DIGITAL SCREENING BILATERAL MAMMOGRAM WITH CAD AND ADJUNCT TOMO COMPARISON:  Previous exam(s). ACR Breast Density Category c: The breast tissue is heterogeneously dense, which may obscure small masses. FINDINGS: There are no findings suspicious for malignancy. Images were processed with CAD. IMPRESSION: No mammographic evidence of malignancy. A result letter of this screening mammogram will be mailed directly to the patient. RECOMMENDATION: Screening mammogram in one year. (Code:SM-B-01Y) BI-RADS CATEGORY  1: Negative. Electronically Signed  By: Evangeline Dakin M.D.   On: 03/27/2017 16:00       Assessment & Plan:   Problem List Items Addressed This Visit    Health care maintenance    Physical today 07/19/17.  Mammogram 03/27/17 - Birads I.  Colonoscopy 06/17/13 - recommended f/u in 5 years.        Hypercholesteremia    On crestor.  Low cholesterol diet and exercise.  Follow lipid panel and liver function tests.        Relevant Orders   Hepatic function panel   Lipid panel   Hyperglycemia    Low carb diet and exercise.  Follow met b and a1c.       Relevant Orders   Hemoglobin A1c   Hypertension    Blood pressure under good control.  Continue same medication regimen.  Follow pressures.  Follow metabolic panel.        Relevant Orders   CBC with Differential/Platelet   TSH   Basic metabolic panel   Light headedness    Some lightheadedness associated with the congestion, etc. She has tried nasacort, mucinex, etc - no improvement in congestion.  Will add astelin as outlined.  Refer to ENT.  Pt in agreement.       Relevant Orders   Ambulatory referral to ENT   Nasal congestion    Persistent congestion despite medication.  Add astelin.  Refer to ENT for  evaluation.        Relevant Orders   Ambulatory referral to ENT   Osteoporosis    On fosamax.  Follow.        Subclavian steal syndrome    S/p revascularization.  Continue risk factor modification.  States has been released.            Einar Pheasant, MD

## 2017-07-22 ENCOUNTER — Encounter: Payer: Self-pay | Admitting: Internal Medicine

## 2017-07-22 DIAGNOSIS — R0981 Nasal congestion: Secondary | ICD-10-CM | POA: Insufficient documentation

## 2017-07-22 NOTE — Assessment & Plan Note (Signed)
Low carb diet and exercise.  Follow met b and a1c.  

## 2017-07-22 NOTE — Assessment & Plan Note (Signed)
S/p revascularization.  Continue risk factor modification.  States has been released.

## 2017-07-22 NOTE — Assessment & Plan Note (Signed)
Persistent congestion despite medication.  Add astelin.  Refer to ENT for evaluation.

## 2017-07-22 NOTE — Assessment & Plan Note (Signed)
Some lightheadedness associated with the congestion, etc. She has tried nasacort, mucinex, etc - no improvement in congestion.  Will add astelin as outlined.  Refer to ENT.  Pt in agreement.

## 2017-07-22 NOTE — Assessment & Plan Note (Signed)
On crestor.  Low cholesterol diet and exercise.  Follow lipid panel and liver function tests.   

## 2017-07-22 NOTE — Assessment & Plan Note (Signed)
Blood pressure under good control.  Continue same medication regimen.  Follow pressures.  Follow metabolic panel.   

## 2017-07-22 NOTE — Assessment & Plan Note (Signed)
On fosamax.  Follow  

## 2017-07-31 ENCOUNTER — Other Ambulatory Visit: Payer: Self-pay | Admitting: Internal Medicine

## 2017-09-13 ENCOUNTER — Encounter: Payer: Self-pay | Admitting: Internal Medicine

## 2017-10-22 ENCOUNTER — Other Ambulatory Visit: Payer: Medicare Other

## 2017-10-24 ENCOUNTER — Ambulatory Visit (INDEPENDENT_AMBULATORY_CARE_PROVIDER_SITE_OTHER): Payer: Medicare Other | Admitting: Internal Medicine

## 2017-10-24 ENCOUNTER — Encounter: Payer: Self-pay | Admitting: Internal Medicine

## 2017-10-24 DIAGNOSIS — I700 Atherosclerosis of aorta: Secondary | ICD-10-CM | POA: Diagnosis not present

## 2017-10-24 DIAGNOSIS — R0981 Nasal congestion: Secondary | ICD-10-CM

## 2017-10-24 DIAGNOSIS — I10 Essential (primary) hypertension: Secondary | ICD-10-CM

## 2017-10-24 DIAGNOSIS — R0989 Other specified symptoms and signs involving the circulatory and respiratory systems: Secondary | ICD-10-CM

## 2017-10-24 DIAGNOSIS — E7800 Pure hypercholesterolemia, unspecified: Secondary | ICD-10-CM

## 2017-10-24 DIAGNOSIS — R739 Hyperglycemia, unspecified: Secondary | ICD-10-CM | POA: Diagnosis not present

## 2017-10-24 LAB — HEPATIC FUNCTION PANEL
ALT: 13 U/L (ref 0–35)
AST: 14 U/L (ref 0–37)
Albumin: 4.2 g/dL (ref 3.5–5.2)
Alkaline Phosphatase: 42 U/L (ref 39–117)
BILIRUBIN DIRECT: 0.1 mg/dL (ref 0.0–0.3)
BILIRUBIN TOTAL: 0.6 mg/dL (ref 0.2–1.2)
Total Protein: 7.2 g/dL (ref 6.0–8.3)

## 2017-10-24 LAB — CBC WITH DIFFERENTIAL/PLATELET
Basophils Absolute: 0 10*3/uL (ref 0.0–0.1)
Basophils Relative: 0.5 % (ref 0.0–3.0)
EOS PCT: 1.3 % (ref 0.0–5.0)
Eosinophils Absolute: 0.1 10*3/uL (ref 0.0–0.7)
HEMATOCRIT: 42.9 % (ref 36.0–46.0)
HEMOGLOBIN: 14.3 g/dL (ref 12.0–15.0)
LYMPHS PCT: 23 % (ref 12.0–46.0)
Lymphs Abs: 1.5 10*3/uL (ref 0.7–4.0)
MCHC: 33.4 g/dL (ref 30.0–36.0)
MCV: 91.5 fl (ref 78.0–100.0)
MONO ABS: 0.5 10*3/uL (ref 0.1–1.0)
MONOS PCT: 8.2 % (ref 3.0–12.0)
Neutro Abs: 4.5 10*3/uL (ref 1.4–7.7)
Neutrophils Relative %: 67 % (ref 43.0–77.0)
Platelets: 286 10*3/uL (ref 150.0–400.0)
RBC: 4.69 Mil/uL (ref 3.87–5.11)
RDW: 13.4 % (ref 11.5–15.5)
WBC: 6.7 10*3/uL (ref 4.0–10.5)

## 2017-10-24 LAB — BASIC METABOLIC PANEL
BUN: 9 mg/dL (ref 6–23)
CALCIUM: 9.7 mg/dL (ref 8.4–10.5)
CO2: 27 mEq/L (ref 19–32)
CREATININE: 0.76 mg/dL (ref 0.40–1.20)
Chloride: 103 mEq/L (ref 96–112)
GFR: 79.57 mL/min (ref >60.00)
Glucose, Bld: 92 mg/dL (ref 70–99)
Potassium: 4 mEq/L (ref 3.5–5.1)
Sodium: 138 mEq/L (ref 135–145)

## 2017-10-24 LAB — TSH: TSH: 1.74 u[IU]/mL (ref 0.35–4.50)

## 2017-10-24 LAB — LIPID PANEL
CHOLESTEROL: 177 mg/dL (ref 0–200)
HDL: 55.1 mg/dL (ref >39.00)
LDL CALC: 101 mg/dL — AB (ref 0–99)
NONHDL: 122.39
Total CHOL/HDL Ratio: 3
Triglycerides: 105 mg/dL (ref 0.0–149.0)
VLDL: 21 mg/dL (ref 0.0–40.0)

## 2017-10-24 LAB — HEMOGLOBIN A1C: HEMOGLOBIN A1C: 5.9 % (ref 4.6–6.5)

## 2017-10-24 MED ORDER — ALENDRONATE SODIUM 70 MG PO TABS: 70.0000 mg | ORAL_TABLET | ORAL | 11 refills | Status: DC

## 2017-10-24 MED ORDER — ROSUVASTATIN CALCIUM 5 MG PO TABS: 5.0000 mg | ORAL_TABLET | Freq: Every day | ORAL | 5 refills | Status: DC

## 2017-10-24 MED ORDER — LOSARTAN POTASSIUM 50 MG PO TABS: 50.0000 mg | ORAL_TABLET | Freq: Every day | ORAL | 3 refills | Status: DC

## 2017-10-24 NOTE — Progress Notes (Signed)
Patient ID: Whitney Carr, female   DOB: 05/18/1946, 72 y.o.   MRN: [id number]   Subjective:    Patient ID: Whitney Carr, female    DOB: Apr 16, 1946, 72 y.o.   MRN: [id number]  HPI  Patient here for a scheduled follow up.  She reports she is doing well.  Feels good.  Trying to stay active.  No chest pain.  No sob.  No acid reflux.  No abdominal pain or cramping.  Bowels moving.  Uses astelin.  Helped.  Did not see ENT.  Does not feel needs.  No increased cough or congestion.  Feels symptoms controlled on current regimen.     Past Medical History:  Diagnosis Date  . Degenerative joint disease   . GERD (gastroesophageal reflux disease)   . Hypercholesterolemia   . Hypertension    Past Surgical History:  Procedure Laterality Date  . APPENDECTOMY  1969  . TONSILECTOMY/ADENOIDECTOMY WITH MYRINGOTOMY  1970  . TUBAL LIGATION  1969  . VAGINAL HYSTERECTOMY  1987   secondary to bleeding   Family History  Problem Relation Age of Onset  . Congestive Heart Failure Mother   . Thyroid disease Mother   . Heart failure Father   . Diabetes Father   . Heart disease Sister        CABG  . Alzheimer's disease Sister   . Diabetes Sister   . Diabetes Sister   . Stroke Brother   . COPD Brother   . COPD Brother   . Colon cancer Brother   . Hypertension Brother   . Heart disease Brother   . Breast cancer Daughter   . Autoimmune disease Daughter    Social History   Socioeconomic History  . Marital status: Married    Spouse name: Not on file  . Number of children: 3  . Years of education: Not on file  . Highest education level: Not on file  Occupational History  . Not on file  Social Needs  . Financial resource strain: Not on file  . Food insecurity:    Worry: Not on file    Inability: Not on file  . Transportation needs:    Medical: Not on file    Non-medical: Not on file  Tobacco Use  . Smoking status: Never Smoker  . Smokeless tobacco: Never Used  Substance and Sexual  Activity  . Alcohol use: No    Alcohol/week: 0.0 oz  . Drug use: No  . Sexual activity: Yes  Lifestyle  . Physical activity:    Days per week: Not on file    Minutes per session: Not on file  . Stress: Not on file  Relationships  . Social connections:    Talks on phone: Not on file    Gets together: Not on file    Attends religious service: Not on file    Active member of club or organization: Not on file    Attends meetings of clubs or organizations: Not on file    Relationship status: Not on file  Other Topics Concern  . Not on file  Social History Narrative  . Not on file    Outpatient Encounter Medications as of 10/24/2017  Medication Sig  . alendronate (FOSAMAX) 70 MG tablet Take 1 tablet (70 mg total) by mouth every 7 (seven) days. Take with a full glass of water on an empty stomach.  Marland Kitchen aspirin 81 MG tablet Take 81 mg by mouth daily.  Marland Kitchen azelastine (ASTELIN) 0.1 %  nasal spray Place 1 spray into both nostrils 2 (two) times daily. Use in each nostril as directed  . Calcium 600-200 MG-UNIT per tablet Take 1 tablet by mouth 2 (two) times daily.  . cholecalciferol (VITAMIN D) 1000 UNITS tablet Take 1,000 Units by mouth daily.  Marland Kitchen losartan (COZAAR) 50 MG tablet Take 1 tablet (50 mg total) by mouth daily.  . rosuvastatin (CRESTOR) 5 MG tablet Take 1 tablet (5 mg total) by mouth daily.  Marland Kitchen SHINGRIX injection   . [DISCONTINUED] alendronate (FOSAMAX) 70 MG tablet Take 1 tablet (70 mg total) by mouth every 7 (seven) days. Take with a full glass of water on an empty stomach.  . [DISCONTINUED] losartan (COZAAR) 50 MG tablet TAKE (1) TABLET BY MOUTH DAILY FOR HIGH BLOOD PRESSURE.  . [DISCONTINUED] rosuvastatin (CRESTOR) 5 MG tablet Take 1 tablet (5 mg total) by mouth daily.   No facility-administered encounter medications on file as of 10/24/2017.     Review of Systems  Constitutional: Negative for appetite change and unexpected weight change.  HENT: Negative for congestion and sinus  pressure.   Respiratory: Negative for cough, chest tightness and shortness of breath.   Cardiovascular: Negative for chest pain, palpitations and leg swelling.  Gastrointestinal: Negative for abdominal pain, diarrhea, nausea and vomiting.  Genitourinary: Negative for difficulty urinating and dysuria.  Musculoskeletal: Negative for joint swelling and myalgias.  Skin: Negative for color change and rash.  Neurological: Negative for dizziness, light-headedness and headaches.  Psychiatric/Behavioral: Negative for agitation and dysphoric mood.       Objective:    Physical Exam  Constitutional: She appears well-developed and well-nourished. No distress.  HENT:  Nose: Nose normal.  Mouth/Throat: Oropharynx is clear and moist.  Neck: Neck supple. No thyromegaly present.  Cardiovascular: Normal rate and regular rhythm.  Pulmonary/Chest: Breath sounds normal. No respiratory distress. She has no wheezes.  Abdominal: Soft. Bowel sounds are normal. There is no tenderness.  Musculoskeletal: She exhibits no edema or tenderness.  Lymphadenopathy:    She has no cervical adenopathy.  Skin: No rash noted. No erythema.  Psychiatric: She has a normal mood and affect. Her behavior is normal.    BP 140/82 (BP Location: Left Arm, Patient Position: Sitting, Cuff Size: Normal)   Pulse 77   Temp 97.9 F (36.6 C) (Oral)   Resp 16   Wt 174 lb 6.4 oz (79.1 kg)   LMP 06/03/1986   SpO2 96%   BMI 29.02 kg/m  Wt Readings from Last 3 Encounters:  10/24/17 174 lb 6.4 oz (79.1 kg)  07/19/17 173 lb 9.6 oz (78.7 kg)  04/02/17 171 lb 12.8 oz (77.9 kg)     Lab Results  Component Value Date   WBC 6.7 10/24/2017   HGB 14.3 10/24/2017   HCT 42.9 10/24/2017   PLT 286.0 10/24/2017   GLUCOSE 92 10/24/2017   CHOL 177 10/24/2017   TRIG 105.0 10/24/2017   HDL 55.10 10/24/2017   LDLDIRECT 143.6 05/13/2012   LDLCALC 101 (H) 10/24/2017   ALT 13 10/24/2017   AST 14 10/24/2017   NA 138 10/24/2017   K 4.0  10/24/2017   CL 103 10/24/2017   CREATININE 0.76 10/24/2017   BUN 9 10/24/2017   CO2 27 10/24/2017   TSH 1.74 10/24/2017   HGBA1C 5.9 10/24/2017    Mm Screening Breast Tomo Bilateral  Result Date: 03/27/2017 CLINICAL DATA:  Screening. EXAM: 2D DIGITAL SCREENING BILATERAL MAMMOGRAM WITH CAD AND ADJUNCT TOMO COMPARISON:  Previous exam(s). ACR Breast Density  Category c: The breast tissue is heterogeneously dense, which may obscure small masses. FINDINGS: There are no findings suspicious for malignancy. Images were processed with CAD. IMPRESSION: No mammographic evidence of malignancy. A result letter of this screening mammogram will be mailed directly to the patient. RECOMMENDATION: Screening mammogram in one year. (Code:SM-B-01Y) BI-RADS CATEGORY  1: Negative. Electronically Signed   By: Evangeline Dakin M.D.   On: 03/27/2017 16:00       Assessment & Plan:   Problem List Items Addressed This Visit    Aortic atherosclerosis ([place])    Noted on recent vascular screening exam.  Continue crestor.        Relevant Medications   rosuvastatin (CRESTOR) 5 MG tablet   losartan (COZAAR) 50 MG tablet   Carotid bruit    Has been followed by vascular surgery.  Just recently had screening.  Read as normal exam.  Described intimal thickening - CCA and bulb bilaterally.  Continue statin medication and blood pressure control.        Hypercholesteremia    On crestor.  Low cholesterol diet and exercise.  Follow lipid panel and liver function tests.        Relevant Medications   rosuvastatin (CRESTOR) 5 MG tablet   losartan (COZAAR) 50 MG tablet   Hyperglycemia    Low carb diet and exercise.  Follow met b and a1c.        Hypertension    Blood pressure has been under good control.  Recheck improved.  Continue same medication regimen.  Follow pressures.  Follow metabolic panel.        Relevant Medications   rosuvastatin (CRESTOR) 5 MG tablet   losartan (COZAAR) 50 MG tablet   Nasal congestion     Improved on current regimen.  Follow. Does not feel needs ENT at this time.            Einar Pheasant, MD

## 2017-10-30 ENCOUNTER — Encounter: Payer: Self-pay | Admitting: Internal Medicine

## 2017-10-30 DIAGNOSIS — I700 Atherosclerosis of aorta: Secondary | ICD-10-CM | POA: Insufficient documentation

## 2017-10-30 DIAGNOSIS — L570 Actinic keratosis: Secondary | ICD-10-CM | POA: Diagnosis not present

## 2017-10-30 NOTE — Assessment & Plan Note (Signed)
On crestor.  Low cholesterol diet and exercise.  Follow lipid panel and liver function tests.   

## 2017-10-30 NOTE — Assessment & Plan Note (Signed)
Low carb diet and exercise.  Follow met b and a1c.   

## 2017-10-30 NOTE — Assessment & Plan Note (Signed)
Improved on current regimen.  Follow. Does not feel needs ENT at this time.

## 2017-10-30 NOTE — Assessment & Plan Note (Signed)
Noted on recent vascular screening exam.  Continue crestor.

## 2017-10-30 NOTE — Assessment & Plan Note (Signed)
Has been followed by vascular surgery.  Just recently had screening.  Read as normal exam.  Described intimal thickening - CCA and bulb bilaterally.  Continue statin medication and blood pressure control.

## 2017-10-30 NOTE — Assessment & Plan Note (Signed)
Blood pressure has been under good control.  Recheck improved.  Continue same medication regimen.  Follow pressures.  Follow metabolic panel.

## 2017-11-14 DIAGNOSIS — I10 Essential (primary) hypertension: Secondary | ICD-10-CM | POA: Diagnosis not present

## 2017-11-14 DIAGNOSIS — G458 Other transient cerebral ischemic attacks and related syndromes: Secondary | ICD-10-CM | POA: Diagnosis not present

## 2017-11-14 DIAGNOSIS — E7800 Pure hypercholesterolemia, unspecified: Secondary | ICD-10-CM | POA: Diagnosis not present

## 2017-11-30 ENCOUNTER — Other Ambulatory Visit: Payer: Self-pay

## 2017-11-30 NOTE — Patient Outreach (Signed)
[facility]) Care Management  11/30/2017  Whitney Carr 09-11-1945 [id number]   Medication Adherence call to Whitney Carr left a message for patient to call back Whitney Carr is due on Rosuvastatin 5 mg. Under [facility] Ins.  [place] Management Direct Dial [phone]  Fax [phone] [email or web address]

## 2018-03-01 ENCOUNTER — Ambulatory Visit (INDEPENDENT_AMBULATORY_CARE_PROVIDER_SITE_OTHER): Payer: Medicare Other | Admitting: Internal Medicine

## 2018-03-01 ENCOUNTER — Encounter: Payer: Self-pay | Admitting: Internal Medicine

## 2018-03-01 VITALS — BP 134/78 | HR 77 | Temp 98.0°F | Resp 18 | Wt 170.8 lb

## 2018-03-01 DIAGNOSIS — R739 Hyperglycemia, unspecified: Secondary | ICD-10-CM

## 2018-03-01 DIAGNOSIS — Z23 Encounter for immunization: Secondary | ICD-10-CM | POA: Diagnosis not present

## 2018-03-01 DIAGNOSIS — I700 Atherosclerosis of aorta: Secondary | ICD-10-CM

## 2018-03-01 DIAGNOSIS — Z1231 Encounter for screening mammogram for malignant neoplasm of breast: Secondary | ICD-10-CM | POA: Diagnosis not present

## 2018-03-01 DIAGNOSIS — E7800 Pure hypercholesterolemia, unspecified: Secondary | ICD-10-CM

## 2018-03-01 DIAGNOSIS — Z1239 Encounter for other screening for malignant neoplasm of breast: Secondary | ICD-10-CM

## 2018-03-01 DIAGNOSIS — R0989 Other specified symptoms and signs involving the circulatory and respiratory systems: Secondary | ICD-10-CM

## 2018-03-01 DIAGNOSIS — G458 Other transient cerebral ischemic attacks and related syndromes: Secondary | ICD-10-CM

## 2018-03-01 DIAGNOSIS — I10 Essential (primary) hypertension: Secondary | ICD-10-CM

## 2018-03-01 LAB — LIPID PANEL
CHOL/HDL RATIO: 3
Cholesterol: 198 mg/dL (ref 0–200)
HDL: 59.1 mg/dL (ref >39.00)
LDL Cholesterol: 115 mg/dL — ABNORMAL HIGH (ref 0–99)
NONHDL: 138.4
Triglycerides: 118 mg/dL (ref 0.0–149.0)
VLDL: 23.6 mg/dL (ref 0.0–40.0)

## 2018-03-01 LAB — BASIC METABOLIC PANEL
BUN: 20 mg/dL (ref 6–23)
CHLORIDE: 105 meq/L (ref 96–112)
CO2: 27 mEq/L (ref 19–32)
CREATININE: 0.77 mg/dL (ref 0.40–1.20)
Calcium: 9.8 mg/dL (ref 8.4–10.5)
GFR: 78.3 mL/min (ref >60.00)
Glucose, Bld: 91 mg/dL (ref 70–99)
Potassium: 3.8 mEq/L (ref 3.5–5.1)
SODIUM: 141 meq/L (ref 135–145)

## 2018-03-01 LAB — HEPATIC FUNCTION PANEL
ALT: 10 U/L (ref 0–35)
AST: 12 U/L (ref 0–37)
Albumin: 4.3 g/dL (ref 3.5–5.2)
Alkaline Phosphatase: 48 U/L (ref 39–117)
Bilirubin, Direct: 0.1 mg/dL (ref 0.0–0.3)
Total Bilirubin: 0.7 mg/dL (ref 0.2–1.2)
Total Protein: 7.4 g/dL (ref 6.0–8.3)

## 2018-03-01 LAB — HEMOGLOBIN A1C: Hgb A1c MFr Bld: 6 % (ref 4.6–6.5)

## 2018-03-01 MED ORDER — LOSARTAN POTASSIUM 100 MG PO TABS: 100.0000 mg | ORAL_TABLET | Freq: Every day | ORAL | 3 refills | Status: DC

## 2018-03-01 NOTE — Progress Notes (Signed)
Patient ID: Whitney Carr, female   DOB: [birth year], 72 y.o.   MRN: [id number]   Subjective:    Patient ID: Whitney Carr, female    DOB: 17-Dec-1945, 72 y.o.   MRN: [id number]  HPI  Patient here for a scheduled follow up. Saw Dr Ubaldo Glassing - 10/2017.  Stable.  Recommended f/u in 6 months.  She reports she is doing relatively well.  States that the pharmacy changed manufacturers (regarding her losartan).  Since the change, her blood pressure has not been under good control.  No chest pain.  No sob.  No acid reflux.  No abdominal pain.  Bowels moving.  No urine change.  Overall she feels she is doing relatively well.     Past Medical History:  Diagnosis Date  . Degenerative joint disease   . GERD (gastroesophageal reflux disease)   . Hypercholesterolemia   . Hypertension    Past Surgical History:  Procedure Laterality Date  . APPENDECTOMY  1969  . TONSILECTOMY/ADENOIDECTOMY WITH MYRINGOTOMY  1970  . TUBAL LIGATION  1969  . VAGINAL HYSTERECTOMY  1987   secondary to bleeding   Family History  Problem Relation Age of Onset  . Congestive Heart Failure Mother   . Thyroid disease Mother   . Heart failure Father   . Diabetes Father   . Heart disease Sister        CABG  . Alzheimer's disease Sister   . Diabetes Sister   . Diabetes Sister   . Stroke Brother   . COPD Brother   . COPD Brother   . Colon cancer Brother   . Hypertension Brother   . Heart disease Brother   . Breast cancer Daughter   . Autoimmune disease Daughter    Social History   Socioeconomic History  . Marital status: Married    Spouse name: Not on file  . Number of children: 3  . Years of education: Not on file  . Highest education level: Not on file  Occupational History  . Not on file  Social Needs  . Financial resource strain: Not on file  . Food insecurity:    Worry: Not on file    Inability: Not on file  . Transportation needs:    Medical: Not on file    Non-medical: Not on file  Tobacco Use    . Smoking status: Never Smoker  . Smokeless tobacco: Never Used  Substance and Sexual Activity  . Alcohol use: No    Alcohol/week: 0.0 standard drinks  . Drug use: No  . Sexual activity: Yes  Lifestyle  . Physical activity:    Days per week: Not on file    Minutes per session: Not on file  . Stress: Not on file  Relationships  . Social connections:    Talks on phone: Not on file    Gets together: Not on file    Attends religious service: Not on file    Active member of club or organization: Not on file    Attends meetings of clubs or organizations: Not on file    Relationship status: Not on file  Other Topics Concern  . Not on file  Social History Narrative  . Not on file    Outpatient Encounter Medications as of 03/01/2018  Medication Sig  . alendronate (FOSAMAX) 70 MG tablet Take 1 tablet (70 mg total) by mouth every 7 (seven) days. Take with a full glass of water on an empty stomach.  Marland Kitchen aspirin 81  MG tablet Take 81 mg by mouth daily.  Marland Kitchen azelastine (ASTELIN) 0.1 % nasal spray Place 1 spray into both nostrils 2 (two) times daily. Use in each nostril as directed  . Calcium 600-200 MG-UNIT per tablet Take 1 tablet by mouth 2 (two) times daily.  . cholecalciferol (VITAMIN D) 1000 UNITS tablet Take 1,000 Units by mouth daily.  Marland Kitchen losartan (COZAAR) 100 MG tablet Take 1 tablet (100 mg total) by mouth daily.  . [DISCONTINUED] losartan (COZAAR) 50 MG tablet Take 1 tablet (50 mg total) by mouth daily.  . [DISCONTINUED] rosuvastatin (CRESTOR) 5 MG tablet Take 1 tablet (5 mg total) by mouth daily.  . [DISCONTINUED] SHINGRIX injection    No facility-administered encounter medications on file as of 03/01/2018.     Review of Systems  Constitutional: Negative for appetite change and unexpected weight change.  HENT: Negative for congestion and sinus pressure.   Respiratory: Negative for cough, chest tightness and shortness of breath.   Cardiovascular: Negative for chest pain, palpitations  and leg swelling.  Gastrointestinal: Negative for abdominal pain, diarrhea, nausea and vomiting.  Genitourinary: Negative for difficulty urinating and dysuria.  Musculoskeletal: Negative for joint swelling and myalgias.  Skin: Negative for color change and rash.  Neurological: Negative for dizziness, light-headedness and headaches.  Psychiatric/Behavioral: Negative for agitation and dysphoric mood.       Objective:     Blood pressure rechecked by me:  134/78 (left) and 140/74 (right).    Physical Exam  Constitutional: She appears well-developed and well-nourished. No distress.  HENT:  Nose: Nose normal.  Mouth/Throat: Oropharynx is clear and moist.  Neck: Neck supple. No thyromegaly present.  Cardiovascular: Normal rate and regular rhythm.  Pulmonary/Chest: Breath sounds normal. No respiratory distress. She has no wheezes.  Abdominal: Soft. Bowel sounds are normal. There is no tenderness.  Musculoskeletal: She exhibits no edema or tenderness.  Lymphadenopathy:    She has no cervical adenopathy.  Skin: No rash noted. No erythema.  Psychiatric: She has a normal mood and affect. Her behavior is normal.    BP 134/78   Pulse 77   Temp 98 F (36.7 C) (Oral)   Resp 18   Wt 170 lb 12.8 oz (77.5 kg)   LMP 06/03/1986   SpO2 97%   BMI 28.42 kg/m  Wt Readings from Last 3 Encounters:  03/01/18 170 lb 12.8 oz (77.5 kg)  10/24/17 174 lb 6.4 oz (79.1 kg)  07/19/17 173 lb 9.6 oz (78.7 kg)     Lab Results  Component Value Date   WBC 6.7 10/24/2017   HGB 14.3 10/24/2017   HCT 42.9 10/24/2017   PLT 286.0 10/24/2017   GLUCOSE 91 03/01/2018   CHOL 198 03/01/2018   TRIG 118.0 03/01/2018   HDL 59.10 03/01/2018   LDLDIRECT 143.6 05/13/2012   LDLCALC 115 (H) 03/01/2018   ALT 10 03/01/2018   AST 12 03/01/2018   NA 141 03/01/2018   K 3.8 03/01/2018   CL 105 03/01/2018   CREATININE 0.77 03/01/2018   BUN 20 03/01/2018   CO2 27 03/01/2018   TSH 1.74 10/24/2017   HGBA1C 6.0  03/01/2018    Mm Screening Breast Tomo Bilateral  Result Date: 03/27/2017 CLINICAL DATA:  Screening. EXAM: 2D DIGITAL SCREENING BILATERAL MAMMOGRAM WITH CAD AND ADJUNCT TOMO COMPARISON:  Previous exam(s). ACR Breast Density Category c: The breast tissue is heterogeneously dense, which may obscure small masses. FINDINGS: There are no findings suspicious for malignancy. Images were processed with CAD. IMPRESSION: No  mammographic evidence of malignancy. A result letter of this screening mammogram will be mailed directly to the patient. RECOMMENDATION: Screening mammogram in one year. (Code:SM-B-01Y) BI-RADS CATEGORY  1: Negative. Electronically Signed   By: Evangeline Dakin M.D.   On: 03/27/2017 16:00       Assessment & Plan:   Problem List Items Addressed This Visit    Aortic atherosclerosis ([place])    Was on crestor.  Discussed restarting another cholesterol medication given intolerance.        Relevant Medications   losartan (COZAAR) 100 MG tablet   Carotid bruit    Has previously been followed by vascular surgery.  Had screening - read as normal exam.  Described intimal thickening - CCA and bulb bilaterally.  Discussed restarting a different statin medication.  Follow.        Hypercholesteremia    Was on crestor.  Did not tolerate higher dose.  Discussed low cholesterol diet and exercise.  Discussed trail of a different cholesterol medication. Follow lipid panel.        Relevant Medications   losartan (COZAAR) 100 MG tablet   Other Relevant Orders   Hepatic function panel (Completed)   Lipid panel (Completed)   Hyperglycemia    Low carb diet and exercise.  Follow met b and a1c.        Relevant Orders   Hemoglobin A1c (Completed)   Hypertension    Blood pressure continues to run higher than goal.  incrase losartan to 160m q day.  Follow pressures.  Follow metabolic panel.        Relevant Medications   losartan (COZAAR) 100 MG tablet   Other Relevant Orders   Basic metabolic  panel (Completed)   Subclavian steal syndrome    S/p revascularization.  Continue risk factor modification.        Relevant Medications   losartan (COZAAR) 100 MG tablet    Other Visit Diagnoses    Breast cancer screening    -  Primary   Relevant Orders   MM 3D SCREEN BREAST BILATERAL   Need for influenza vaccination       Relevant Orders   Flu vaccine HIGH DOSE PF (Fluzone High dose) (Completed)       CEinar Pheasant MD

## 2018-03-02 ENCOUNTER — Encounter: Payer: Self-pay | Admitting: Internal Medicine

## 2018-03-02 NOTE — Assessment & Plan Note (Signed)
Was on crestor.  Discussed restarting another cholesterol medication given intolerance.

## 2018-03-02 NOTE — Assessment & Plan Note (Signed)
Low carb diet and exercise.  Follow met b and a1c.   

## 2018-03-02 NOTE — Assessment & Plan Note (Signed)
S/p revascularization.  Continue risk factor modification.  

## 2018-03-02 NOTE — Assessment & Plan Note (Signed)
Blood pressure continues to run higher than goal.  incrase losartan to 100mg  q day.  Follow pressures.  Follow metabolic panel.

## 2018-03-02 NOTE — Assessment & Plan Note (Signed)
Was on crestor.  Did not tolerate higher dose.  Discussed low cholesterol diet and exercise.  Discussed trail of a different cholesterol medication. Follow lipid panel.

## 2018-03-02 NOTE — Assessment & Plan Note (Signed)
Has previously been followed by vascular surgery.  Had screening - read as normal exam.  Described intimal thickening - CCA and bulb bilaterally.  Discussed restarting a different statin medication.  Follow.

## 2018-03-04 ENCOUNTER — Other Ambulatory Visit: Payer: Self-pay

## 2018-03-04 MED ORDER — LOVASTATIN 10 MG PO TABS: 10.0000 mg | ORAL_TABLET | Freq: Every day | ORAL | 0 refills | Status: DC

## 2018-03-07 DIAGNOSIS — M170 Bilateral primary osteoarthritis of knee: Secondary | ICD-10-CM | POA: Diagnosis not present

## 2018-03-08 ENCOUNTER — Other Ambulatory Visit: Payer: Self-pay

## 2018-03-08 ENCOUNTER — Telehealth: Payer: Self-pay | Admitting: Internal Medicine

## 2018-03-08 MED ORDER — AMLODIPINE BESYLATE 2.5 MG PO TABS: 2.5000 mg | ORAL_TABLET | Freq: Every day | ORAL | 1 refills | Status: DC

## 2018-03-08 NOTE — Telephone Encounter (Signed)
Patient aware. Rx for amlodipine sent In. Patient is going to try that. She has a f/u scheduled on 04/12/18

## 2018-03-08 NOTE — Telephone Encounter (Signed)
Copied from [place] [phone] [id number]. Topic: General - Other >> Mar 08, 2018 10:28 AM Lennox Solders wrote: Reason for CRM:pt saw dr Nicki Reaper on 03-01-18 and md changed her bp medication  to 100 mg of losartan. Pt bp is running today 156/72  heart rate 96 . Yesterday bp 150/66 and heart rate 96  Please advise. Pt said the pharm has changed vendor

## 2018-03-08 NOTE — Telephone Encounter (Signed)
Patient called in with an update regarding change in BP medication

## 2018-03-08 NOTE — Telephone Encounter (Signed)
If she wants to stick with same medication, can send in rx for losartan 100mg  and put name brand only.  Not sure if insurance will cover.  If remains on current medication as is, then I would recommend adding amlodipine 2.5mg  q day.  Follow pressures.  Make sure has f/u scheduled.

## 2018-03-15 ENCOUNTER — Encounter: Payer: Self-pay | Admitting: Internal Medicine

## 2018-03-15 NOTE — Telephone Encounter (Signed)
Advised patient to take Lovastatin 10 mg MWF per last result note

## 2018-04-03 ENCOUNTER — Ambulatory Visit (INDEPENDENT_AMBULATORY_CARE_PROVIDER_SITE_OTHER): Payer: Medicare Other

## 2018-04-03 ENCOUNTER — Ambulatory Visit
Admission: RE | Admit: 2018-04-03 | Discharge: 2018-04-03 | Disposition: A | Discharge status: Home / Self Care | Payer: Medicare Other | Source: Ambulatory Visit | Attending: Internal Medicine | Admitting: Internal Medicine

## 2018-04-03 VITALS — BP 130/64 | HR 93 | Temp 98.2°F | Resp 15 | Ht 65.75 in | Wt 168.0 lb

## 2018-04-03 DIAGNOSIS — Z0000 Encounter for general adult medical examination without abnormal findings: Secondary | ICD-10-CM

## 2018-04-03 DIAGNOSIS — Z1231 Encounter for screening mammogram for malignant neoplasm of breast: Secondary | ICD-10-CM | POA: Diagnosis not present

## 2018-04-03 DIAGNOSIS — Z1239 Encounter for other screening for malignant neoplasm of breast: Secondary | ICD-10-CM

## 2018-04-03 NOTE — Progress Notes (Addendum)
Subjective:   Whitney Carr is a 72 y.o. female who presents for Medicare Annual (Subsequent) preventive examination.  Review of Systems:  No ROS.  Medicare Wellness Visit. Additional risk factors are reflected in the social history.  Cardiac Risk Factors include: advanced age (>47men, >76 women);hypertension     Objective:     Vitals: BP 130/64 (BP Location: Left Arm, Patient Position: Sitting, Cuff Size: Normal)   Pulse 93   Temp 98.2 F (36.8 C) (Oral)   Resp 15   Ht 5' 5.75" (1.67 m)   Wt 168 lb (76.2 kg)   LMP 06/03/1986   BMI 27.32 kg/m   Body mass index is 27.32 kg/m.  Advanced Directives 04/03/2018 04/02/2017 07/04/2016 07/04/2016  Does Patient Have a Medical Advance Directive? Yes Yes Yes Yes;No  Type of Paramedic of [place];Living will Living will;Healthcare Power of Attorney Living will;Healthcare Power of Attorney -  Does patient want to make changes to medical advance directive? No - Patient declined No - Patient declined - -  Copy of [place] in Chart? No - copy requested No - copy requested No - copy requested -    Tobacco Social History   Tobacco Use  Smoking Status Never Smoker  Smokeless Tobacco Never Used     Counseling given: Not Answered   Clinical Intake:  Pre-visit preparation completed: Yes  Pain : No/denies pain     Nutritional Status: BMI 25 -29 Overweight Diabetes: No  How often do you need to have someone help you when you read instructions, pamphlets, or other written materials from your doctor or pharmacy?: 1 - Never  Interpreter Needed?: No     Past Medical History:  Diagnosis Date  . Degenerative joint disease   . GERD (gastroesophageal reflux disease)   . Hypercholesterolemia   . Hypertension    Past Surgical History:  Procedure Laterality Date  . APPENDECTOMY  1969  . TONSILECTOMY/ADENOIDECTOMY WITH MYRINGOTOMY  1970  . TUBAL LIGATION  1969  . VAGINAL HYSTERECTOMY   1987   secondary to bleeding   Family History  Problem Relation Age of Onset  . Congestive Heart Failure Mother   . Thyroid disease Mother   . Heart failure Father   . Diabetes Father   . Heart disease Sister        CABG  . Alzheimer's disease Sister   . Diabetes Sister   . Diabetes Sister   . Stroke Brother   . COPD Brother   . COPD Brother   . Colon cancer Brother   . Hypertension Brother   . Heart disease Brother   . Breast cancer Daughter   . Autoimmune disease Daughter    Social History   Socioeconomic History  . Marital status: Married    Spouse name: Not on file  . Number of children: 3  . Years of education: Not on file  . Highest education level: Not on file  Occupational History  . Not on file  Social Needs  . Financial resource strain: Not hard at all  . Food insecurity:    Worry: Never true    Inability: Never true  . Transportation needs:    Medical: No    Non-medical: No  Tobacco Use  . Smoking status: Never Smoker  . Smokeless tobacco: Never Used  Substance and Sexual Activity  . Alcohol use: No    Alcohol/week: 0.0 standard drinks  . Drug use: No  . Sexual activity: Yes  Lifestyle  . Physical activity:    Days per week: 5 days    Minutes per session: 30 min  . Stress: Not at all  Relationships  . Social connections:    Talks on phone: Not on file    Gets together: Not on file    Attends religious service: Not on file    Active member of club or organization: Not on file    Attends meetings of clubs or organizations: Not on file    Relationship status: Not on file  Other Topics Concern  . Not on file  Social History Narrative  . Not on file    Outpatient Encounter Medications as of 04/03/2018  Medication Sig  . alendronate (FOSAMAX) 70 MG tablet Take 1 tablet (70 mg total) by mouth every 7 (seven) days. Take with a full glass of water on an empty stomach.  Marland Kitchen amLODipine (NORVASC) 2.5 MG tablet Take 1 tablet (2.5 mg total) by mouth  daily.  Marland Kitchen aspirin 81 MG tablet Take 81 mg by mouth daily.  Marland Kitchen azelastine (ASTELIN) 0.1 % nasal spray Place 1 spray into both nostrils 2 (two) times daily. Use in each nostril as directed  . Calcium 600-200 MG-UNIT per tablet Take 1 tablet by mouth 2 (two) times daily.  . cholecalciferol (VITAMIN D) 1000 UNITS tablet Take 1,000 Units by mouth daily.  Marland Kitchen losartan (COZAAR) 100 MG tablet Take 1 tablet (100 mg total) by mouth daily.  Marland Kitchen lovastatin (MEVACOR) 10 MG tablet Take 1 tablet (10 mg total) by mouth daily.   No facility-administered encounter medications on file as of 04/03/2018.     Activities of Daily Living In your present state of health, do you have any difficulty performing the following activities: 04/03/2018  Hearing? N  Vision? N  Difficulty concentrating or making decisions? N  Walking or climbing stairs? Y  Comment Chronic knee pain.  Injections for pain x2 months ago.   Dressing or bathing? N  Doing errands, shopping? N  Preparing Food and eating ? N  Using the Toilet? N  In the past six months, have you accidently leaked urine? N  Do you have problems with loss of bowel control? N  Managing your Medications? N  Managing your Finances? N  Housekeeping or managing your Housekeeping? N  Some recent data might be hidden    Patient Care Team: Einar Pheasant, MD as PCP - General (Internal Medicine)    Assessment:   This is a routine wellness examination for [facility].  The goal of the wellness visit is to assist the patient how to close the gaps in care and create a preventative care plan for the patient.   Taking fosamax and calcium as appropriate/Osteoporosis reviewed.    Safety issues reviewed; Smoke and carbon monoxide detectors in the home. No firearms in the home. Wears seatbelts when driving or riding with others. No violence in the home.  They do not have excessive sun exposure.  Discussed the need for sun protection: hats, long sleeves and the use of sunscreen if  there is significant sun exposure.  No new identified risk were noted.  No failures at ADL's or IADL's.    BMI- discussed the importance of a healthy diet, water intake and the benefits of aerobic exercise. Educational material provided.   24 hour diet recall: Low carb diet  Dental- every 6 months.  Eye- Visual acuity not assessed per patient preference since they have regular follow up with the ophthalmologist.  Wears  corrective lenses.  Sleep patterns- Sleeps 7-8 hours at night.   Mammogram complete today.  Health maintenance gaps- closed.  Patient Concerns: None at this time. Follow up with PCP as needed.  Exercise Activities and Dietary recommendations Current Exercise Habits: Home exercise routine, Type of exercise: walking, Time (Minutes): 45, Frequency (Times/Week): 5, Weekly Exercise (Minutes/Week): 225, Intensity: Mild  Goals      Patient Stated   . Increase physical activity (pt-stated)     Water aerobics       Fall Risk Fall Risk  04/03/2018 04/02/2017 07/04/2016 03/01/2016 11/12/2014  Falls in the past year? No No No No No  Depression Screen PHQ 2/9 Scores 04/03/2018 04/02/2017 07/04/2016 03/01/2016  PHQ - 2 Score 0 0 0 0     Cognitive Function MMSE - Mini Mental State Exam 04/03/2018 04/02/2017  Orientation to time 5 5  Orientation to Place 5 5  Registration 3 3  Attention/ Calculation 5 5  Recall 3 3  Language- name 2 objects 2 2  Language- repeat 1 1  Language- follow 3 step command 3 3  Language- read & follow direction 1 1  Write a sentence 1 1  Copy design 1 1  Total score 30 30        Immunization History  Administered Date(s) Administered  . Influenza Split 05/06/2012  . Influenza, High Dose Seasonal PF 03/23/2015, 03/01/2016, 04/02/2017, 03/01/2018  . Influenza,inj,Quad PF,6+ Mos 03/09/2014  . Influenza-Unspecified 02/29/2016  . Pneumococcal Conjugate-13 01/20/2014  . Pneumococcal Polysaccharide-23 05/08/2012  . Tdap 01/31/2017  . Zoster  Recombinat (Shingrix) 05/15/2017   Screening Tests Health Maintenance  Topic Date Due  . MAMMOGRAM  03/27/2018  . COLONOSCOPY  06/18/2023  . TETANUS/TDAP  02/01/2027  . INFLUENZA VACCINE  Completed  . DEXA SCAN  Completed  . Hepatitis C Screening  Completed  . PNA vac Low Risk Adult  Completed      Plan:    End of life planning; Advance aging; Advanced directives discussed. Copy of current HCPOA/Living Will requested.    I have personally reviewed and noted the following in the patient's chart:   . Medical and social history . Use of alcohol, tobacco or illicit drugs  . Current medications and supplements . Functional ability and status . Nutritional status . Physical activity . Advanced directives . List of other physicians . Hospitalizations, surgeries, and ER visits in previous 12 months . Vitals . Screenings to include cognitive, depression, and falls . Referrals and appointments  In addition, I have reviewed and discussed with patient certain preventive protocols, quality metrics, and best practice recommendations. A written personalized care plan for preventive services as well as general preventive health recommendations were provided to patient.     OBrien-Blaney, Kristee Angus L, LPN  51/01/8415   I have reviewed the above information and agree with above.   Deborra Medina, MD

## 2018-04-03 NOTE — Patient Instructions (Addendum)
  Ms. Gierke , Thank you for taking time to come for your Medicare Wellness Visit. I appreciate your ongoing commitment to your health goals. Please review the following plan we discussed and let me know if I can assist you in the future.   Follow up as needed.    Bring a copy of your [place] and/or Living Will to be scanned into chart.  Have a great day!  These are the goals we discussed: Goals      Patient Stated   . Increase physical activity (pt-stated)     Water aerobics       This is a list of the screening recommended for you and due dates:  Health Maintenance  Topic Date Due  . Mammogram  03/27/2018  . Colon Cancer Screening  06/18/2023  . Tetanus Vaccine  02/01/2027  . Flu Shot  Completed  . DEXA scan (bone density measurement)  Completed  .  Hepatitis C: One time screening is recommended by Center for Disease Control  (CDC) for  adults born from [birth year] through 1965.   Completed  . Pneumonia vaccines  Completed

## 2018-04-12 ENCOUNTER — Encounter: Payer: Self-pay | Admitting: Internal Medicine

## 2018-04-12 ENCOUNTER — Ambulatory Visit (INDEPENDENT_AMBULATORY_CARE_PROVIDER_SITE_OTHER): Payer: Medicare Other | Admitting: Internal Medicine

## 2018-04-12 VITALS — BP 136/78 | HR 78 | Temp 98.6°F | Resp 18 | Wt 166.2 lb

## 2018-04-12 DIAGNOSIS — I700 Atherosclerosis of aorta: Secondary | ICD-10-CM

## 2018-04-12 DIAGNOSIS — I10 Essential (primary) hypertension: Secondary | ICD-10-CM

## 2018-04-12 DIAGNOSIS — R739 Hyperglycemia, unspecified: Secondary | ICD-10-CM

## 2018-04-12 DIAGNOSIS — R110 Nausea: Secondary | ICD-10-CM | POA: Insufficient documentation

## 2018-04-12 DIAGNOSIS — E7800 Pure hypercholesterolemia, unspecified: Secondary | ICD-10-CM

## 2018-04-12 DIAGNOSIS — R634 Abnormal weight loss: Secondary | ICD-10-CM

## 2018-04-12 DIAGNOSIS — R000 Tachycardia, unspecified: Secondary | ICD-10-CM

## 2018-04-12 DIAGNOSIS — R52 Pain, unspecified: Secondary | ICD-10-CM

## 2018-04-12 DIAGNOSIS — R0602 Shortness of breath: Secondary | ICD-10-CM

## 2018-04-12 LAB — CBC WITH DIFFERENTIAL/PLATELET
BASOS ABS: 0.1 10*3/uL (ref 0.0–0.1)
Basophils Relative: 0.8 % (ref 0.0–3.0)
EOS ABS: 0.1 10*3/uL (ref 0.0–0.7)
Eosinophils Relative: 1.3 % (ref 0.0–5.0)
HEMATOCRIT: 43.4 % (ref 36.0–46.0)
HEMOGLOBIN: 14.4 g/dL (ref 12.0–15.0)
LYMPHS PCT: 15.2 % (ref 12.0–46.0)
Lymphs Abs: 1.2 10*3/uL (ref 0.7–4.0)
MCHC: 33.2 g/dL (ref 30.0–36.0)
MCV: 92.4 fl (ref 78.0–100.0)
Monocytes Absolute: 0.6 10*3/uL (ref 0.1–1.0)
Monocytes Relative: 7.2 % (ref 3.0–12.0)
Neutro Abs: 6.2 10*3/uL (ref 1.4–7.7)
Neutrophils Relative %: 75.5 % (ref 43.0–77.0)
Platelets: 275 10*3/uL (ref 150.0–400.0)
RBC: 4.7 Mil/uL (ref 3.87–5.11)
RDW: 13.4 % (ref 11.5–15.5)
WBC: 8.1 10*3/uL (ref 4.0–10.5)

## 2018-04-12 LAB — BASIC METABOLIC PANEL
BUN: 22 mg/dL (ref 6–23)
CHLORIDE: 103 meq/L (ref 96–112)
CO2: 26 mEq/L (ref 19–32)
CREATININE: 0.94 mg/dL (ref 0.40–1.20)
Calcium: 9.8 mg/dL (ref 8.4–10.5)
GFR: 62.18 mL/min (ref >60.00)
GLUCOSE: 90 mg/dL (ref 70–99)
Potassium: 4.4 mEq/L (ref 3.5–5.1)
Sodium: 138 mEq/L (ref 135–145)

## 2018-04-12 LAB — HEPATIC FUNCTION PANEL
ALK PHOS: 56 U/L (ref 39–117)
ALT: 9 U/L (ref 0–35)
AST: 11 U/L (ref 0–37)
Albumin: 4.3 g/dL (ref 3.5–5.2)
BILIRUBIN DIRECT: 0.1 mg/dL (ref 0.0–0.3)
TOTAL PROTEIN: 7.5 g/dL (ref 6.0–8.3)
Total Bilirubin: 0.5 mg/dL (ref 0.2–1.2)

## 2018-04-12 LAB — LIPASE: Lipase: 16 U/L (ref 11.0–59.0)

## 2018-04-12 LAB — AMYLASE: AMYLASE: 33 U/L (ref 27–131)

## 2018-04-12 NOTE — Progress Notes (Signed)
Patient ID: Whitney Carr, female   DOB: 04-01-1946, 72 y.o.   MRN: [id number]   Subjective:    Patient ID: Whitney Carr, female    DOB: 13-Feb-1946, 72 y.o.   MRN: [id number]  HPI  Patient here for a scheduled follow up.  She reports she is not feeling well.  She reports increased aching.  Some increased heart rate and some sob - intermittent episodes.  Noticed more when walking up and down stairs.  Some occasional nausea.  Eating.  No abdominal pain.  No dizziness or light headedness.  No syncope or near syncope.  No acid reflux.  Bowels moving.  On lovastatin.     Past Medical History:  Diagnosis Date  . Degenerative joint disease   . GERD (gastroesophageal reflux disease)   . Hypercholesterolemia   . Hypertension    Past Surgical History:  Procedure Laterality Date  . APPENDECTOMY  1969  . TONSILECTOMY/ADENOIDECTOMY WITH MYRINGOTOMY  1970  . TUBAL LIGATION  1969  . VAGINAL HYSTERECTOMY  1987   secondary to bleeding   Family History  Problem Relation Age of Onset  . Congestive Heart Failure Mother   . Thyroid disease Mother   . Heart failure Father   . Diabetes Father   . Heart disease Sister        CABG  . Alzheimer's disease Sister   . Diabetes Sister   . Diabetes Sister   . Stroke Brother   . COPD Brother   . COPD Brother   . Colon cancer Brother   . Hypertension Brother   . Heart disease Brother   . Breast cancer Daughter   . Autoimmune disease Daughter    Social History   Socioeconomic History  . Marital status: Married    Spouse name: Not on file  . Number of children: 3  . Years of education: Not on file  . Highest education level: Not on file  Occupational History  . Not on file  Social Needs  . Financial resource strain: Not hard at all  . Food insecurity:    Worry: Never true    Inability: Never true  . Transportation needs:    Medical: No    Non-medical: No  Tobacco Use  . Smoking status: Never Smoker  . Smokeless tobacco: Never  Used  Substance and Sexual Activity  . Alcohol use: No    Alcohol/week: 0.0 standard drinks  . Drug use: No  . Sexual activity: Yes  Lifestyle  . Physical activity:    Days per week: 5 days    Minutes per session: 30 min  . Stress: Not at all  Relationships  . Social connections:    Talks on phone: Not on file    Gets together: Not on file    Attends religious service: Not on file    Active member of club or organization: Not on file    Attends meetings of clubs or organizations: Not on file    Relationship status: Not on file  Other Topics Concern  . Not on file  Social History Narrative  . Not on file    Outpatient Encounter Medications as of 04/12/2018  Medication Sig  . alendronate (FOSAMAX) 70 MG tablet Take 1 tablet (70 mg total) by mouth every 7 (seven) days. Take with a full glass of water on an empty stomach.  Marland Kitchen amLODipine (NORVASC) 2.5 MG tablet Take 1 tablet (2.5 mg total) by mouth daily.  Marland Kitchen aspirin 81 MG tablet  Take 81 mg by mouth daily.  Marland Kitchen azelastine (ASTELIN) 0.1 % nasal spray Place 1 spray into both nostrils 2 (two) times daily. Use in each nostril as directed  . Calcium 600-200 MG-UNIT per tablet Take 1 tablet by mouth 2 (two) times daily.  . cholecalciferol (VITAMIN D) 1000 UNITS tablet Take 1,000 Units by mouth daily.  Marland Kitchen losartan (COZAAR) 100 MG tablet Take 1 tablet (100 mg total) by mouth daily.  Marland Kitchen lovastatin (MEVACOR) 10 MG tablet Take 1 tablet (10 mg total) by mouth daily.   No facility-administered encounter medications on file as of 04/12/2018.     Review of Systems  Constitutional: Positive for fatigue. Negative for appetite change and unexpected weight change.  HENT: Negative for congestion and sinus pressure.   Respiratory: Positive for shortness of breath. Negative for cough and chest tightness.   Cardiovascular: Negative for chest pain, palpitations and leg swelling.  Gastrointestinal: Negative for abdominal pain, diarrhea and vomiting.        Occasional nausea.   Genitourinary: Negative for difficulty urinating and dysuria.  Musculoskeletal: Negative for joint swelling.       Muscle aching.    Skin: Negative for color change and rash.  Neurological: Negative for dizziness, light-headedness and headaches.  Psychiatric/Behavioral: Negative for agitation and dysphoric mood.       Objective:    Physical Exam  Constitutional: She appears well-developed and well-nourished. No distress.  HENT:  Nose: Nose normal.  Mouth/Throat: Oropharynx is clear and moist.  Neck: Neck supple. No thyromegaly present.  Cardiovascular: Normal rate and regular rhythm.  Pulmonary/Chest: Breath sounds normal. No respiratory distress. She has no wheezes.  Abdominal: Soft. Bowel sounds are normal. There is no tenderness.  Musculoskeletal: She exhibits no edema or tenderness.  Lymphadenopathy:    She has no cervical adenopathy.  Skin: No rash noted. No erythema.  Psychiatric: She has a normal mood and affect. Her behavior is normal.    BP 136/78 (BP Location: Left Arm, Patient Position: Sitting, Cuff Size: Normal)   Pulse 78   Temp 98.6 F (37 C) (Oral)   Resp 18   Wt 166 lb 3.2 oz (75.4 kg)   LMP 06/03/1986   SpO2 97%   BMI 27.03 kg/m  Wt Readings from Last 3 Encounters:  04/12/18 166 lb 3.2 oz (75.4 kg)  04/03/18 168 lb (76.2 kg)  03/01/18 170 lb 12.8 oz (77.5 kg)     Lab Results  Component Value Date   WBC 8.1 04/12/2018   HGB 14.4 04/12/2018   HCT 43.4 04/12/2018   PLT 275.0 04/12/2018   GLUCOSE 90 04/12/2018   CHOL 198 03/01/2018   TRIG 118.0 03/01/2018   HDL 59.10 03/01/2018   LDLDIRECT 143.6 05/13/2012   LDLCALC 115 (H) 03/01/2018   ALT 9 04/12/2018   AST 11 04/12/2018   NA 138 04/12/2018   K 4.4 04/12/2018   CL 103 04/12/2018   CREATININE 0.94 04/12/2018   BUN 22 04/12/2018   CO2 26 04/12/2018   TSH 1.74 10/24/2017   HGBA1C 6.0 03/01/2018    Mm 3d Screen Breast Bilateral  Result Date: 04/03/2018 CLINICAL  DATA:  Screening. EXAM: DIGITAL SCREENING BILATERAL MAMMOGRAM WITH TOMO AND CAD COMPARISON:  Previous exam(s). ACR Breast Density Category b: There are scattered areas of fibroglandular density. FINDINGS: There are no findings suspicious for malignancy. Images were processed with CAD. IMPRESSION: No mammographic evidence of malignancy. A result letter of this screening mammogram will be mailed directly to the patient.  RECOMMENDATION: Screening mammogram in one year. (Code:SM-B-01Y) BI-RADS CATEGORY  1: Negative. Electronically Signed   By: Kristopher Oppenheim M.D.   On: 04/03/2018 13:32       Assessment & Plan:   Problem List Items Addressed This Visit    Aching    Describes aching and just not feeling well.  Will stop lovastatin.  Pursue cardiac w/up as outlined.        Aortic atherosclerosis ([place])    Has been on lovastatin.        Hypercholesteremia    Was on crestor.  Did not tolerate.  Now on lovastatin.  With aching and not feeling well.  Will stop lovastatin.  Low cholesterol diet and exercise.  Follow lipid panel.        Hyperglycemia    Low carb diet and exercise.  Follow met b and a1c.        Hypertension    Blood pressure under good control.  Continue same medication regimen.  Follow pressures.  Follow metabolic panel.        Nausea    Some intermittent nausea.  No abdominal pain.  Check amylase and lipase and routine labs.  Stop lovastatin.        Relevant Orders   Amylase (Completed)   Lipase (Completed)   SOB (shortness of breath) on exertion    Pt describes sob with exertion and increased heart rate.  Noted mostly with walking up and down stairs and with inclines.  EKG - SR with no acute ischemic changes.  Sees Dr Ubaldo Glassing.  Will schedule earlier appt to determine if further cardiac w/up warranted.        Relevant Orders   Ambulatory referral to Cardiology    Other Visit Diagnoses    Tachycardia    -  Primary   Relevant Orders   EKG 12-Lead (Completed)   Basic  metabolic panel (Completed)   Weight loss       Relevant Orders   CBC with Differential/Platelet (Completed)   Hepatic function panel (Completed)   Basic metabolic panel (Completed)   Amylase (Completed)   Lipase (Completed)       Einar Pheasant, MD

## 2018-04-13 ENCOUNTER — Encounter: Payer: Self-pay | Admitting: Internal Medicine

## 2018-04-15 ENCOUNTER — Telehealth: Payer: Self-pay

## 2018-04-15 ENCOUNTER — Encounter: Payer: Self-pay | Admitting: Internal Medicine

## 2018-04-15 DIAGNOSIS — R072 Precordial pain: Secondary | ICD-10-CM | POA: Diagnosis not present

## 2018-04-15 DIAGNOSIS — R52 Pain, unspecified: Secondary | ICD-10-CM | POA: Insufficient documentation

## 2018-04-15 DIAGNOSIS — I10 Essential (primary) hypertension: Secondary | ICD-10-CM | POA: Diagnosis not present

## 2018-04-15 DIAGNOSIS — R000 Tachycardia, unspecified: Secondary | ICD-10-CM | POA: Diagnosis not present

## 2018-04-15 DIAGNOSIS — R0602 Shortness of breath: Secondary | ICD-10-CM | POA: Diagnosis not present

## 2018-04-15 DIAGNOSIS — G458 Other transient cerebral ischemic attacks and related syndromes: Secondary | ICD-10-CM | POA: Diagnosis not present

## 2018-04-15 NOTE — Assessment & Plan Note (Signed)
Pt describes sob with exertion and increased heart rate.  Noted mostly with walking up and down stairs and with inclines.  EKG - SR with no acute ischemic changes.  Sees Dr Ubaldo Glassing.  Will schedule earlier appt to determine if further cardiac w/up warranted.

## 2018-04-15 NOTE — Assessment & Plan Note (Signed)
Describes aching and just not feeling well.  Will stop lovastatin.  Pursue cardiac w/up as outlined.

## 2018-04-15 NOTE — Telephone Encounter (Signed)
Copied from [place] [phone]. Topic: General - Other >> Apr 15, 2018  3:25 PM Oneta Rack wrote: Whitney Carr Human name: [name]  Relation to pt: [facility] cardiologist  Call back number: [phone]    Reason for call:  Requesting most recent EKG please fax to [phone], patient currently at the specialist office.

## 2018-04-15 NOTE — Assessment & Plan Note (Signed)
Blood pressure under good control.  Continue same medication regimen.  Follow pressures.  Follow metabolic panel.   

## 2018-04-15 NOTE — Telephone Encounter (Signed)
I am not in the office.  The EKG is in the computer.  Can you fax the EKG to [facility]?

## 2018-04-15 NOTE — Assessment & Plan Note (Signed)
Low carb diet and exercise.  Follow met b and a1c.   

## 2018-04-15 NOTE — Assessment & Plan Note (Signed)
Has been on lovastatin.

## 2018-04-15 NOTE — Assessment & Plan Note (Signed)
Some intermittent nausea.  No abdominal pain.  Check amylase and lipase and routine labs.  Stop lovastatin.

## 2018-04-15 NOTE — Telephone Encounter (Signed)
EKG faxed 

## 2018-04-15 NOTE — Assessment & Plan Note (Signed)
Was on crestor.  Did not tolerate.  Now on lovastatin.  With aching and not feeling well.  Will stop lovastatin.  Low cholesterol diet and exercise.  Follow lipid panel.

## 2018-04-29 DIAGNOSIS — R0602 Shortness of breath: Secondary | ICD-10-CM | POA: Diagnosis not present

## 2018-05-14 ENCOUNTER — Encounter: Payer: Self-pay | Admitting: Internal Medicine

## 2018-05-14 ENCOUNTER — Ambulatory Visit (INDEPENDENT_AMBULATORY_CARE_PROVIDER_SITE_OTHER): Payer: Medicare Other | Admitting: Internal Medicine

## 2018-05-14 DIAGNOSIS — E7800 Pure hypercholesterolemia, unspecified: Secondary | ICD-10-CM | POA: Diagnosis not present

## 2018-05-14 DIAGNOSIS — R739 Hyperglycemia, unspecified: Secondary | ICD-10-CM

## 2018-05-14 DIAGNOSIS — I10 Essential (primary) hypertension: Secondary | ICD-10-CM

## 2018-05-14 DIAGNOSIS — I700 Atherosclerosis of aorta: Secondary | ICD-10-CM

## 2018-05-14 MED ORDER — AMLODIPINE BESYLATE 2.5 MG PO TABS: 2.5000 mg | ORAL_TABLET | Freq: Every day | ORAL | 1 refills | Status: DC

## 2018-05-14 MED ORDER — LOSARTAN POTASSIUM 100 MG PO TABS: 100.0000 mg | ORAL_TABLET | Freq: Every day | ORAL | 1 refills | Status: DC

## 2018-05-14 NOTE — Progress Notes (Signed)
Patient ID: Whitney Carr, female   DOB: 1945/11/14, 72 y.o.   MRN: [id number]   Subjective:    Patient ID: Whitney Carr, female    DOB: [birth year], 72 y.o.   MRN: [id number]  HPI  Patient here for a scheduled follow up.  Saw cardiology 04/15/18 - preserved LV function, mild LVH, mild MR and mild TR.  Had holter placed.  Has f/u with Dr Ubaldo Glassing tomorrow.  States she feels better.  Relates her previous symptoms to the statin medication.  When stopped the medication, symptoms resolved.  No chest pain.  No sob.  No acid reflux.  No abdominal pain.  Bowels moving.  Blood pressures averaging 789-381O systolic.  Is exercising.     Past Medical History:  Diagnosis Date  . Degenerative joint disease   . GERD (gastroesophageal reflux disease)   . Hypercholesterolemia   . Hypertension    Past Surgical History:  Procedure Laterality Date  . APPENDECTOMY  1969  . TONSILECTOMY/ADENOIDECTOMY WITH MYRINGOTOMY  1970  . TUBAL LIGATION  1969  . VAGINAL HYSTERECTOMY  1987   secondary to bleeding   Family History  Problem Relation Age of Onset  . Congestive Heart Failure Mother   . Thyroid disease Mother   . Heart failure Father   . Diabetes Father   . Heart disease Sister        CABG  . Alzheimer's disease Sister   . Diabetes Sister   . Diabetes Sister   . Stroke Brother   . COPD Brother   . COPD Brother   . Colon cancer Brother   . Hypertension Brother   . Heart disease Brother   . Breast cancer Daughter   . Autoimmune disease Daughter    Social History   Socioeconomic History  . Marital status: Married    Spouse name: Not on file  . Number of children: 3  . Years of education: Not on file  . Highest education level: Not on file  Occupational History  . Not on file  Social Needs  . Financial resource strain: Not hard at all  . Food insecurity:    Worry: Never true    Inability: Never true  . Transportation needs:    Medical: No    Non-medical: No  Tobacco Use  .  Smoking status: Never Smoker  . Smokeless tobacco: Never Used  Substance and Sexual Activity  . Alcohol use: No    Alcohol/week: 0.0 standard drinks  . Drug use: No  . Sexual activity: Yes  Lifestyle  . Physical activity:    Days per week: 5 days    Minutes per session: 30 min  . Stress: Not at all  Relationships  . Social connections:    Talks on phone: Not on file    Gets together: Not on file    Attends religious service: Not on file    Active member of club or organization: Not on file    Attends meetings of clubs or organizations: Not on file    Relationship status: Not on file  Other Topics Concern  . Not on file  Social History Narrative  . Not on file    Outpatient Encounter Medications as of 05/14/2018  Medication Sig  . amLODipine (NORVASC) 2.5 MG tablet Take 1 tablet (2.5 mg total) by mouth daily.  Marland Kitchen aspirin 81 MG tablet Take 81 mg by mouth daily.  Marland Kitchen azelastine (ASTELIN) 0.1 % nasal spray Place 1 spray into both nostrils 2 (  two) times daily. Use in each nostril as directed  . Calcium 600-200 MG-UNIT per tablet Take 1 tablet by mouth 2 (two) times daily.  . cholecalciferol (VITAMIN D) 1000 UNITS tablet Take 1,000 Units by mouth daily.  Marland Kitchen losartan (COZAAR) 100 MG tablet Take 1 tablet (100 mg total) by mouth daily.  . [DISCONTINUED] alendronate (FOSAMAX) 70 MG tablet Take 1 tablet (70 mg total) by mouth every 7 (seven) days. Take with a full glass of water on an empty stomach.  . [DISCONTINUED] amLODipine (NORVASC) 2.5 MG tablet Take 1 tablet (2.5 mg total) by mouth daily.  . [DISCONTINUED] losartan (COZAAR) 100 MG tablet Take 1 tablet (100 mg total) by mouth daily.  . [DISCONTINUED] lovastatin (MEVACOR) 10 MG tablet Take 1 tablet (10 mg total) by mouth daily.   No facility-administered encounter medications on file as of 05/14/2018.     Review of Systems  Constitutional: Negative for appetite change and unexpected weight change.  HENT: Negative for congestion and  sinus pressure.   Respiratory: Negative for cough, chest tightness and shortness of breath.   Cardiovascular: Negative for chest pain, palpitations and leg swelling.  Gastrointestinal: Negative for abdominal pain, diarrhea, nausea and vomiting.  Genitourinary: Negative for difficulty urinating and dysuria.  Musculoskeletal: Negative for joint swelling and myalgias.  Skin: Negative for color change and rash.  Neurological: Negative for dizziness, light-headedness and headaches.  Psychiatric/Behavioral: Negative for agitation and dysphoric mood.       Objective:    Physical Exam  Constitutional: She appears well-developed and well-nourished. No distress.  HENT:  Nose: Nose normal.  Mouth/Throat: Oropharynx is clear and moist.  Neck: Neck supple. No thyromegaly present.  Cardiovascular: Normal rate and regular rhythm.  Pulmonary/Chest: Breath sounds normal. No respiratory distress. She has no wheezes.  Abdominal: Soft. Bowel sounds are normal. There is no tenderness.  Musculoskeletal: She exhibits no edema or tenderness.  Lymphadenopathy:    She has no cervical adenopathy.  Skin: No rash noted. No erythema.  Psychiatric: She has a normal mood and affect. Her behavior is normal.    BP 136/74 (BP Location: Left Arm, Patient Position: Sitting, Cuff Size: Normal)   Pulse 80   Temp 97.9 F (36.6 C) (Oral)   Resp 18   Wt 167 lb 6.4 oz (75.9 kg)   LMP 06/03/1986   SpO2 98%   BMI 27.22 kg/m  Wt Readings from Last 3 Encounters:  05/14/18 167 lb 6.4 oz (75.9 kg)  04/12/18 166 lb 3.2 oz (75.4 kg)  04/03/18 168 lb (76.2 kg)     Lab Results  Component Value Date   WBC 8.1 04/12/2018   HGB 14.4 04/12/2018   HCT 43.4 04/12/2018   PLT 275.0 04/12/2018   GLUCOSE 90 04/12/2018   CHOL 198 03/01/2018   TRIG 118.0 03/01/2018   HDL 59.10 03/01/2018   LDLDIRECT 143.6 05/13/2012   LDLCALC 115 (H) 03/01/2018   ALT 9 04/12/2018   AST 11 04/12/2018   NA 138 04/12/2018   K 4.4  04/12/2018   CL 103 04/12/2018   CREATININE 0.94 04/12/2018   BUN 22 04/12/2018   CO2 26 04/12/2018   TSH 1.74 10/24/2017   HGBA1C 6.0 03/01/2018    Mm 3d Screen Breast Bilateral  Result Date: 04/03/2018 CLINICAL DATA:  Screening. EXAM: DIGITAL SCREENING BILATERAL MAMMOGRAM WITH TOMO AND CAD COMPARISON:  Previous exam(s). ACR Breast Density Category b: There are scattered areas of fibroglandular density. FINDINGS: There are no findings suspicious for malignancy. Images  were processed with CAD. IMPRESSION: No mammographic evidence of malignancy. A result letter of this screening mammogram will be mailed directly to the patient. RECOMMENDATION: Screening mammogram in one year. (Code:SM-B-01Y) BI-RADS CATEGORY  1: Negative. Electronically Signed   By: Kristopher Oppenheim M.D.   On: 04/03/2018 13:32       Assessment & Plan:   Problem List Items Addressed This Visit    Aortic atherosclerosis ([place])    Was on satin medication.  Had intolerance.        Relevant Medications   losartan (COZAAR) 100 MG tablet   amLODipine (NORVASC) 2.5 MG tablet   Hypercholesteremia    Was on crestor.  Did not tolerate.  Previously on lovastatin.  Intolerance.  Doing better now off.  Follow lipid panel.  Low cholesterol diet and exercise.        Relevant Medications   losartan (COZAAR) 100 MG tablet   amLODipine (NORVASC) 2.5 MG tablet   Other Relevant Orders   Hepatic function panel   Lipid panel   Hyperglycemia    Low carb diet and exercise.  Follow met b and a1c.        Relevant Orders   Hemoglobin A1c   Hypertension    Blood pressure as outlined.  On losartan.  Amlodipine 2.54m q day.  Follow pressures.        Relevant Medications   losartan (COZAAR) 100 MG tablet   amLODipine (NORVASC) 2.5 MG tablet   Other Relevant Orders   Basic metabolic panel       CEinar Pheasant MD

## 2018-05-15 DIAGNOSIS — G458 Other transient cerebral ischemic attacks and related syndromes: Secondary | ICD-10-CM | POA: Diagnosis not present

## 2018-05-15 DIAGNOSIS — R072 Precordial pain: Secondary | ICD-10-CM | POA: Diagnosis not present

## 2018-05-15 DIAGNOSIS — I10 Essential (primary) hypertension: Secondary | ICD-10-CM | POA: Diagnosis not present

## 2018-05-15 DIAGNOSIS — I700 Atherosclerosis of aorta: Secondary | ICD-10-CM | POA: Diagnosis not present

## 2018-05-19 ENCOUNTER — Encounter: Payer: Self-pay | Admitting: Internal Medicine

## 2018-05-20 NOTE — Assessment & Plan Note (Signed)
Blood pressure as outlined.  On losartan.  Amlodipine 2.5mg  q day.  Follow pressures.

## 2018-05-20 NOTE — Assessment & Plan Note (Signed)
Low carb diet and exercise.  Follow met b and a1c.   

## 2018-05-20 NOTE — Assessment & Plan Note (Signed)
Was on crestor.  Did not tolerate.  Previously on lovastatin.  Intolerance.  Doing better now off.  Follow lipid panel.  Low cholesterol diet and exercise.

## 2018-05-20 NOTE — Assessment & Plan Note (Signed)
Was on satin medication.  Had intolerance.

## 2018-08-28 ENCOUNTER — Other Ambulatory Visit (INDEPENDENT_AMBULATORY_CARE_PROVIDER_SITE_OTHER): Payer: Medicare Other

## 2018-08-28 DIAGNOSIS — I10 Essential (primary) hypertension: Secondary | ICD-10-CM | POA: Diagnosis not present

## 2018-08-28 DIAGNOSIS — R739 Hyperglycemia, unspecified: Secondary | ICD-10-CM | POA: Diagnosis not present

## 2018-08-28 DIAGNOSIS — E7800 Pure hypercholesterolemia, unspecified: Secondary | ICD-10-CM | POA: Diagnosis not present

## 2018-08-28 LAB — HEMOGLOBIN A1C: Hgb A1c MFr Bld: 5.8 % (ref 4.6–6.5)

## 2018-08-28 LAB — HEPATIC FUNCTION PANEL
ALT: 10 U/L (ref 0–35)
AST: 12 U/L (ref 0–37)
Albumin: 4.4 g/dL (ref 3.5–5.2)
Alkaline Phosphatase: 49 U/L (ref 39–117)
Bilirubin, Direct: 0.1 mg/dL (ref 0.0–0.3)
Total Bilirubin: 0.6 mg/dL (ref 0.2–1.2)
Total Protein: 7.2 g/dL (ref 6.0–8.3)

## 2018-08-28 LAB — LIPID PANEL
CHOLESTEROL: 172 mg/dL (ref 0–200)
HDL: 52.9 mg/dL (ref >39.00)
LDL Cholesterol: 97 mg/dL (ref 0–99)
NonHDL: 119.22
TRIGLYCERIDES: 109 mg/dL (ref 0.0–149.0)
Total CHOL/HDL Ratio: 3
VLDL: 21.8 mg/dL (ref 0.0–40.0)

## 2018-08-28 LAB — BASIC METABOLIC PANEL
BUN: 24 mg/dL — AB (ref 6–23)
CHLORIDE: 103 meq/L (ref 96–112)
CO2: 27 mEq/L (ref 19–32)
Calcium: 9.6 mg/dL (ref 8.4–10.5)
Creatinine, Ser: 0.74 mg/dL (ref 0.40–1.20)
GFR: 77.02 mL/min (ref >60.00)
Glucose, Bld: 92 mg/dL (ref 70–99)
POTASSIUM: 4.3 meq/L (ref 3.5–5.1)
SODIUM: 136 meq/L (ref 135–145)

## 2018-08-30 ENCOUNTER — Encounter: Payer: Self-pay | Admitting: Internal Medicine

## 2018-08-30 ENCOUNTER — Ambulatory Visit (INDEPENDENT_AMBULATORY_CARE_PROVIDER_SITE_OTHER): Payer: Medicare Other | Admitting: Internal Medicine

## 2018-08-30 VITALS — BP 132/68 | HR 80 | Temp 97.5°F | Resp 16 | Ht 66.0 in | Wt 169.4 lb

## 2018-08-30 DIAGNOSIS — E7800 Pure hypercholesterolemia, unspecified: Secondary | ICD-10-CM

## 2018-08-30 DIAGNOSIS — I700 Atherosclerosis of aorta: Secondary | ICD-10-CM | POA: Diagnosis not present

## 2018-08-30 DIAGNOSIS — R739 Hyperglycemia, unspecified: Secondary | ICD-10-CM

## 2018-08-30 DIAGNOSIS — I10 Essential (primary) hypertension: Secondary | ICD-10-CM

## 2018-08-30 DIAGNOSIS — R0989 Other specified symptoms and signs involving the circulatory and respiratory systems: Secondary | ICD-10-CM | POA: Diagnosis not present

## 2018-08-30 DIAGNOSIS — G458 Other transient cerebral ischemic attacks and related syndromes: Secondary | ICD-10-CM

## 2018-08-30 DIAGNOSIS — E2839 Other primary ovarian failure: Secondary | ICD-10-CM | POA: Diagnosis not present

## 2018-08-30 DIAGNOSIS — Z0000 Encounter for general adult medical examination without abnormal findings: Secondary | ICD-10-CM

## 2018-08-30 DIAGNOSIS — M810 Age-related osteoporosis without current pathological fracture: Secondary | ICD-10-CM

## 2018-08-30 MED ORDER — AMLODIPINE BESYLATE 2.5 MG PO TABS: 2.5000 mg | ORAL_TABLET | Freq: Every day | ORAL | 3 refills | Status: DC

## 2018-08-30 MED ORDER — LOSARTAN POTASSIUM 100 MG PO TABS: 100.0000 mg | ORAL_TABLET | Freq: Every day | ORAL | 3 refills | Status: DC

## 2018-08-30 NOTE — Assessment & Plan Note (Signed)
Physical today 08/30/18.  Mammogram 04/03/18 -birads I.  Colonoscopy 05/2013 - recommended f/u in 5 years.  She received notice due colonoscopy and is planning to schedule.  Schedule bone density.

## 2018-08-30 NOTE — Progress Notes (Signed)
Patient ID: Whitney Carr, female   DOB: 1945-11-29, 73 y.o.   MRN: [id number]   Subjective:    Patient ID: Whitney Carr, female    DOB: 04-Aug-1945, 73 y.o.   MRN: [id number]  HPI  Patient here for her scheduled physical exam.  She reports she feels good.  Doing well.  Trying to stay active.  No chest pain.  No sob.  No acid reflux.  No abdominal pain.  Bowels moving.  Blood pressure doing well.  Saw cardiology.  W/up unrevealing.  Recommended 6 month f/u.     Past Medical History:  Diagnosis Date  . Degenerative joint disease   . GERD (gastroesophageal reflux disease)   . Hypercholesterolemia   . Hypertension    Past Surgical History:  Procedure Laterality Date  . APPENDECTOMY  1969  . TONSILECTOMY/ADENOIDECTOMY WITH MYRINGOTOMY  1970  . TUBAL LIGATION  1969  . VAGINAL HYSTERECTOMY  1987   secondary to bleeding   Family History  Problem Relation Age of Onset  . Congestive Heart Failure Mother   . Thyroid disease Mother   . Heart failure Father   . Diabetes Father   . Heart disease Sister        CABG  . Alzheimer's disease Sister   . Diabetes Sister   . Diabetes Sister   . Stroke Brother   . COPD Brother   . COPD Brother   . Colon cancer Brother   . Hypertension Brother   . Heart disease Brother   . Breast cancer Daughter   . Autoimmune disease Daughter    Social History   Socioeconomic History  . Marital status: Married    Spouse name: Not on file  . Number of children: 3  . Years of education: Not on file  . Highest education level: Not on file  Occupational History  . Not on file  Social Needs  . Financial resource strain: Not hard at all  . Food insecurity:    Worry: Never true    Inability: Never true  . Transportation needs:    Medical: No    Non-medical: No  Tobacco Use  . Smoking status: Never Smoker  . Smokeless tobacco: Never Used  Substance and Sexual Activity  . Alcohol use: No    Alcohol/week: 0.0 standard drinks  . Drug use:  No  . Sexual activity: Yes  Lifestyle  . Physical activity:    Days per week: 5 days    Minutes per session: 30 min  . Stress: Not at all  Relationships  . Social connections:    Talks on phone: Not on file    Gets together: Not on file    Attends religious service: Not on file    Active member of club or organization: Not on file    Attends meetings of clubs or organizations: Not on file    Relationship status: Not on file  Other Topics Concern  . Not on file  Social History Narrative  . Not on file    Outpatient Encounter Medications as of 08/30/2018  Medication Sig  . amLODipine (NORVASC) 2.5 MG tablet Take 1 tablet (2.5 mg total) by mouth daily.  Marland Kitchen aspirin 81 MG tablet Take 81 mg by mouth daily.  Marland Kitchen azelastine (ASTELIN) 0.1 % nasal spray Place 1 spray into both nostrils 2 (two) times daily. Use in each nostril as directed  . Calcium 600-200 MG-UNIT per tablet Take 1 tablet by mouth 2 (two) times daily.  Marland Kitchen  cholecalciferol (VITAMIN D) 1000 UNITS tablet Take 1,000 Units by mouth daily.  Marland Kitchen losartan (COZAAR) 100 MG tablet Take 1 tablet (100 mg total) by mouth daily.  . [DISCONTINUED] amLODipine (NORVASC) 2.5 MG tablet Take 1 tablet (2.5 mg total) by mouth daily.  . [DISCONTINUED] losartan (COZAAR) 100 MG tablet Take 1 tablet (100 mg total) by mouth daily.   No facility-administered encounter medications on file as of 08/30/2018.     Review of Systems  Constitutional: Negative for appetite change and unexpected weight change.  HENT: Negative for congestion and sinus pressure.   Eyes: Negative for pain and visual disturbance.  Respiratory: Negative for cough, chest tightness and shortness of breath.   Cardiovascular: Negative for chest pain, palpitations and leg swelling.  Gastrointestinal: Negative for abdominal pain, diarrhea, nausea and vomiting.  Genitourinary: Negative for difficulty urinating and dysuria.  Musculoskeletal: Negative for joint swelling and myalgias.  Skin:  Negative for color change and rash.  Neurological: Negative for dizziness, light-headedness and headaches.  Hematological: Negative for adenopathy. Does not bruise/bleed easily.  Psychiatric/Behavioral: Negative for agitation and dysphoric mood.       Objective:     Blood pressure rechecked by me:  132/70  Physical Exam Constitutional:      General: She is not in acute distress.    Appearance: Normal appearance. She is well-developed.  HENT:     Nose: Nose normal. No congestion.     Mouth/Throat:     Pharynx: No oropharyngeal exudate or posterior oropharyngeal erythema.  Eyes:     General: No scleral icterus.       Right eye: No discharge.        Left eye: No discharge.  Neck:     Musculoskeletal: Neck supple. No muscular tenderness.     Thyroid: No thyromegaly.  Cardiovascular:     Rate and Rhythm: Normal rate and regular rhythm.  Pulmonary:     Effort: No tachypnea, accessory muscle usage or respiratory distress.     Breath sounds: Normal breath sounds. No decreased breath sounds or wheezing.  Chest:     Breasts:        Right: No inverted nipple, mass, nipple discharge or tenderness (no axillary adenopathy).        Left: No inverted nipple, mass, nipple discharge or tenderness (no axilarry adenopathy).  Abdominal:     General: Bowel sounds are normal.     Palpations: Abdomen is soft.     Tenderness: There is no abdominal tenderness.  Musculoskeletal:        General: No swelling or tenderness.  Lymphadenopathy:     Cervical: No cervical adenopathy.  Skin:    Findings: No erythema or rash.  Neurological:     Mental Status: She is alert and oriented to person, place, and time.  Psychiatric:        Mood and Affect: Mood normal.        Behavior: Behavior normal.     BP 132/68   Pulse 80   Temp (!) 97.5 F (36.4 C) (Oral)   Resp 16   Ht '5\' 6"'$  (1.676 m)   Wt 169 lb 6.4 oz (76.8 kg)   LMP 06/03/1986   SpO2 97%   BMI 27.34 kg/m  Wt Readings from Last 3  Encounters:  08/30/18 169 lb 6.4 oz (76.8 kg)  05/14/18 167 lb 6.4 oz (75.9 kg)  04/12/18 166 lb 3.2 oz (75.4 kg)     Lab Results  Component Value Date  WBC 8.1 04/12/2018   HGB 14.4 04/12/2018   HCT 43.4 04/12/2018   PLT 275.0 04/12/2018   GLUCOSE 92 08/28/2018   CHOL 172 08/28/2018   TRIG 109.0 08/28/2018   HDL 52.90 08/28/2018   LDLDIRECT 143.6 05/13/2012   LDLCALC 97 08/28/2018   ALT 10 08/28/2018   AST 12 08/28/2018   NA 136 08/28/2018   K 4.3 08/28/2018   CL 103 08/28/2018   CREATININE 0.74 08/28/2018   BUN 24 (H) 08/28/2018   CO2 27 08/28/2018   TSH 1.74 10/24/2017   HGBA1C 5.8 08/28/2018    Mm 3d Screen Breast Bilateral  Result Date: 04/03/2018 CLINICAL DATA:  Screening. EXAM: DIGITAL SCREENING BILATERAL MAMMOGRAM WITH TOMO AND CAD COMPARISON:  Previous exam(s). ACR Breast Density Category b: There are scattered areas of fibroglandular density. FINDINGS: There are no findings suspicious for malignancy. Images were processed with CAD. IMPRESSION: No mammographic evidence of malignancy. A result letter of this screening mammogram will be mailed directly to the patient. RECOMMENDATION: Screening mammogram in one year. (Code:SM-B-01Y) BI-RADS CATEGORY  1: Negative. Electronically Signed   By: Kristopher Oppenheim M.D.   On: 04/03/2018 13:32       Assessment & Plan:   Problem List Items Addressed This Visit    Aortic atherosclerosis ([place])    Unable to take statin medication.  Had intolerance.        Relevant Medications   losartan (COZAAR) 100 MG tablet   amLODipine (NORVASC) 2.5 MG tablet   Carotid bruit    Worked up by vascular surgery.  Had screening - read as normal exam.  Described minimal thickening - CCA and bulb bilaterally.  Stated no further w/up warranted.  Follow.        Health care maintenance    Physical today 08/30/18.  Mammogram 04/03/18 -birads I.  Colonoscopy 05/2013 - recommended f/u in 5 years.  She received notice due colonoscopy and is planning to  schedule.  Schedule bone density.      Hypercholesteremia    Has tried crestor, lovastatin.  Did not tolerate.  Significant symptoms.  Feels better.  Cardiology recommended remaining off.  Follow lipid panel.  Recent check improved.        Relevant Medications   losartan (COZAAR) 100 MG tablet   amLODipine (NORVASC) 2.5 MG tablet   Other Relevant Orders   Lipid panel   Hepatic function panel   Hyperglycemia    Low carb diet and exercise.  Follow met b and a1c.       Relevant Orders   Hemoglobin A1c   Hypertension    Blood pressure under good control.  Continue same medication regimen.  Follow pressures.  Follow metabolic panel.        Relevant Medications   losartan (COZAAR) 100 MG tablet   amLODipine (NORVASC) 2.5 MG tablet   Other Relevant Orders   TSH   Basic metabolic panel   Osteoporosis    Recheck bone density.  Previously taking fosamax.        Subclavian steal syndrome    S/p revascularization.  Continue risk factor modificaiton.        Relevant Medications   losartan (COZAAR) 100 MG tablet   amLODipine (NORVASC) 2.5 MG tablet    Other Visit Diagnoses    Routine general medical examination at a health care facility    -  Primary   Estrogen deficiency       Relevant Orders   DG Bone Density  Einar Pheasant, MD

## 2018-08-31 ENCOUNTER — Encounter: Payer: Self-pay | Admitting: Internal Medicine

## 2018-08-31 NOTE — Assessment & Plan Note (Signed)
Blood pressure under good control.  Continue same medication regimen.  Follow pressures.  Follow metabolic panel.   

## 2018-08-31 NOTE — Assessment & Plan Note (Signed)
Worked up by vascular surgery.  Had screening - read as normal exam.  Described minimal thickening - CCA and bulb bilaterally.  Stated no further w/up warranted.  Follow.

## 2018-08-31 NOTE — Assessment & Plan Note (Signed)
Recheck bone density.  Previously taking fosamax.

## 2018-08-31 NOTE — Assessment & Plan Note (Signed)
S/p revascularization.  Continue risk factor modificaiton.

## 2018-08-31 NOTE — Assessment & Plan Note (Signed)
Unable to take statin medication.  Had intolerance.

## 2018-08-31 NOTE — Assessment & Plan Note (Signed)
Has tried crestor, lovastatin.  Did not tolerate.  Significant symptoms.  Feels better.  Cardiology recommended remaining off.  Follow lipid panel.  Recent check improved.

## 2018-08-31 NOTE — Assessment & Plan Note (Signed)
Low carb diet and exercise.  Follow met b and a1c.  

## 2018-11-19 DIAGNOSIS — M810 Age-related osteoporosis without current pathological fracture: Secondary | ICD-10-CM | POA: Diagnosis not present

## 2018-11-19 DIAGNOSIS — I700 Atherosclerosis of aorta: Secondary | ICD-10-CM | POA: Diagnosis not present

## 2018-11-19 DIAGNOSIS — I10 Essential (primary) hypertension: Secondary | ICD-10-CM | POA: Diagnosis not present

## 2018-11-19 DIAGNOSIS — E7800 Pure hypercholesterolemia, unspecified: Secondary | ICD-10-CM | POA: Diagnosis not present

## 2018-11-19 LAB — HM DEXA SCAN

## 2018-11-25 DIAGNOSIS — M170 Bilateral primary osteoarthritis of knee: Secondary | ICD-10-CM | POA: Diagnosis not present

## 2018-12-23 ENCOUNTER — Telehealth: Payer: Self-pay

## 2018-12-23 NOTE — Telephone Encounter (Signed)
Unable to leave message for patient to return call back. Patient needs to be informed that she has Osteoporosis, bone density has decreased. Dr Nicki Reaper wants patient to have DOXY appointment to discuss treatment. P[facility] maygive results and obtain information.

## 2019-01-08 ENCOUNTER — Other Ambulatory Visit (INDEPENDENT_AMBULATORY_CARE_PROVIDER_SITE_OTHER): Payer: Medicare Other

## 2019-01-08 ENCOUNTER — Other Ambulatory Visit: Payer: Self-pay

## 2019-01-08 DIAGNOSIS — R739 Hyperglycemia, unspecified: Secondary | ICD-10-CM | POA: Diagnosis not present

## 2019-01-08 DIAGNOSIS — I10 Essential (primary) hypertension: Secondary | ICD-10-CM | POA: Diagnosis not present

## 2019-01-08 DIAGNOSIS — E7800 Pure hypercholesterolemia, unspecified: Secondary | ICD-10-CM | POA: Diagnosis not present

## 2019-01-08 LAB — HEMOGLOBIN A1C: Hgb A1c MFr Bld: 5.7 % (ref 4.6–6.5)

## 2019-01-08 LAB — BASIC METABOLIC PANEL
BUN: 17 mg/dL (ref 6–23)
CO2: 25 mEq/L (ref 19–32)
Calcium: 9.5 mg/dL (ref 8.4–10.5)
Chloride: 101 mEq/L (ref 96–112)
Creatinine, Ser: 0.88 mg/dL (ref 0.40–1.20)
GFR: 63 mL/min (ref >60.00)
Glucose, Bld: 99 mg/dL (ref 70–99)
Potassium: 4.3 mEq/L (ref 3.5–5.1)
Sodium: 135 mEq/L (ref 135–145)

## 2019-01-08 LAB — LIPID PANEL
Cholesterol: 214 mg/dL — ABNORMAL HIGH (ref 0–200)
HDL: 58.5 mg/dL (ref >39.00)
LDL Cholesterol: 133 mg/dL — ABNORMAL HIGH (ref 0–99)
NonHDL: 155.68
Total CHOL/HDL Ratio: 4
Triglycerides: 112 mg/dL (ref 0.0–149.0)
VLDL: 22.4 mg/dL (ref 0.0–40.0)

## 2019-01-08 LAB — HEPATIC FUNCTION PANEL
ALT: 9 U/L (ref 0–35)
AST: 11 U/L (ref 0–37)
Albumin: 4.4 g/dL (ref 3.5–5.2)
Alkaline Phosphatase: 56 U/L (ref 39–117)
Bilirubin, Direct: 0.1 mg/dL (ref 0.0–0.3)
Total Bilirubin: 0.5 mg/dL (ref 0.2–1.2)
Total Protein: 6.8 g/dL (ref 6.0–8.3)

## 2019-01-08 LAB — TSH: TSH: 1.56 u[IU]/mL (ref 0.35–4.50)

## 2019-01-09 ENCOUNTER — Encounter: Payer: Self-pay | Admitting: Internal Medicine

## 2019-01-14 ENCOUNTER — Other Ambulatory Visit: Payer: Self-pay

## 2019-01-14 ENCOUNTER — Encounter: Payer: Self-pay | Admitting: Internal Medicine

## 2019-01-14 ENCOUNTER — Ambulatory Visit (INDEPENDENT_AMBULATORY_CARE_PROVIDER_SITE_OTHER): Payer: Medicare Other | Admitting: Internal Medicine

## 2019-01-14 DIAGNOSIS — G458 Other transient cerebral ischemic attacks and related syndromes: Secondary | ICD-10-CM

## 2019-01-14 DIAGNOSIS — R739 Hyperglycemia, unspecified: Secondary | ICD-10-CM

## 2019-01-14 DIAGNOSIS — Z860100 Personal history of colon polyps, unspecified: Secondary | ICD-10-CM

## 2019-01-14 DIAGNOSIS — I700 Atherosclerosis of aorta: Secondary | ICD-10-CM | POA: Diagnosis not present

## 2019-01-14 DIAGNOSIS — I10 Essential (primary) hypertension: Secondary | ICD-10-CM

## 2019-01-14 DIAGNOSIS — M810 Age-related osteoporosis without current pathological fracture: Secondary | ICD-10-CM

## 2019-01-14 DIAGNOSIS — E7800 Pure hypercholesterolemia, unspecified: Secondary | ICD-10-CM | POA: Diagnosis not present

## 2019-01-14 NOTE — Progress Notes (Signed)
Patient ID: Whitney Carr, female   DOB: Aug 16, 1945, 73 y.o.   MRN: [id number]   Virtual Visit via telephone Note  This visit type was conducted due to national recommendations for restrictions regarding the COVID-19 pandemic (e.g. social distancing).  This format is felt to be most appropriate for this patient at this time.  All issues noted in this document were discussed and addressed.  No physical exam was performed (except for noted visual exam findings with Video Visits).   I connected with Whitney Carr by telephone and verified that I am speaking with the correct person using two identifiers. Location patient: home Location provider: work  Persons participating in the telephone visit: patient, provider  I discussed the limitations, risks, security and privacy concerns of performing an evaluation and management service by telephone and the availability of in person appointments. The patient expressed understanding and agreed to proceed.   Reason for visit: scheduled follow up.    HPI: She reports she is doing well.  Feels good.  Stays active. No chest pain.  No sob.  No acid reflux. No abdominal pain.  Bowels moving.  Saw cardiology 11/19/18.  Stable.  Recommended f/u in one year.  Previous echo 04/2018 - normal LV function, mild LVH, mild MR and mild TR.  Discussed recent lab results.  Discussed cholesterol.  She had intolerance to statin medication.  (significant intolerance).  Discussed zetia.  She prefers not to start cholesterol medication at this time.  Discussed diet and exercise.  Reports blood pressures averaging 130s/50s.  Discussed bone density results.  Previously on fosamax.  Discussed treatment options, including forteo and prolia.  She declines any further treatment at this time.  Due colonoscopy.  Recently saw ortho.  Bilateral knee injections.  Knees better.     ROS: See pertinent positives and negatives per HPI.  Past Medical History:  Diagnosis Date  . Degenerative  joint disease   . GERD (gastroesophageal reflux disease)   . Hypercholesterolemia   . Hypertension     Past Surgical History:  Procedure Laterality Date  . APPENDECTOMY  1969  . TONSILECTOMY/ADENOIDECTOMY WITH MYRINGOTOMY  1970  . TUBAL LIGATION  1969  . VAGINAL HYSTERECTOMY  1987   secondary to bleeding    Family History  Problem Relation Age of Onset  . Congestive Heart Failure Mother   . Thyroid disease Mother   . Heart failure Father   . Diabetes Father   . Heart disease Sister        CABG  . Alzheimer's disease Sister   . Diabetes Sister   . Diabetes Sister   . Stroke Brother   . COPD Brother   . COPD Brother   . Colon cancer Brother   . Hypertension Brother   . Heart disease Brother   . Breast cancer Daughter   . Autoimmune disease Daughter     SOCIAL HX: reviewed.    Current Outpatient Medications:  .  amLODipine (NORVASC) 2.5 MG tablet, Take 1 tablet (2.5 mg total) by mouth daily., Disp: 90 tablet, Rfl: 3 .  aspirin 81 MG tablet, Take 81 mg by mouth daily., Disp: , Rfl:  .  azelastine (ASTELIN) 0.1 % nasal spray, Place 1 spray into both nostrils 2 (two) times daily. Use in each nostril as directed, Disp: 30 mL, Rfl: 1 .  Calcium 600-200 MG-UNIT per tablet, Take 1 tablet by mouth 2 (two) times daily., Disp: , Rfl:  .  cholecalciferol (VITAMIN D) 1000 UNITS tablet, Take  1,000 Units by mouth daily., Disp: , Rfl:  .  losartan (COZAAR) 100 MG tablet, Take 1 tablet (100 mg total) by mouth daily., Disp: 90 tablet, Rfl: 3  EXAM:  VITALS per patient if applicable: 143-888/75Z.   GENERAL: alert.  Sounds to be in no acute distress.  Answering questions appropriately.    PSYCH/NEURO: pleasant and cooperative, no obvious depression or anxiety, speech and thought processing grossly intact  ASSESSMENT AND PLAN:  Discussed the following assessment and plan:  Aortic atherosclerosis ([place]) Unable to take statin medication.  Discussed zetia.    History of colonic  polyps Colonoscopy 05/2013.  Recommended f/u in 5 years.  Will notify me when agreeable.    Hypercholesteremia Intolerant to statin medication.  Discussed zetia. She declines at this time.  Follow.  Low cholesterol diet and exercise.    Hyperglycemia Low carb diet and exercise.  Follow met b and a1c.   Hypertension Blood pressure her been under good control.  Continue same medication regimen.  Follow pressures.  Follow metabolic panel.    Osteoporosis Discussed recent bone density results.  Discussed other treatment options.  Previously on fosamax.  She request not to start any new medication at this time.  Follow.  Calcium, vitamin D and weight bearing exercise.    Subclavian steal syndrome S/p revascularization.  Continue risk factor modification.      I discussed the assessment and treatment plan with the patient. The patient was provided an opportunity to ask questions and all were answered. The patient agreed with the plan and demonstrated an understanding of the instructions.   The patient was advised to call back or seek an in-person evaluation if the symptoms worsen or if the condition fails to improve as anticipated.  I provided 18 minutes of non-face-to-face time during this encounter.   Einar Pheasant, MD

## 2019-01-19 ENCOUNTER — Encounter: Payer: Self-pay | Admitting: Internal Medicine

## 2019-01-19 NOTE — Assessment & Plan Note (Signed)
Discussed recent bone density results.  Discussed other treatment options.  Previously on fosamax.  She request not to start any new medication at this time.  Follow.  Calcium, vitamin D and weight bearing exercise.

## 2019-01-19 NOTE — Assessment & Plan Note (Signed)
S/p revascularization.  Continue risk factor modification.  

## 2019-01-19 NOTE — Assessment & Plan Note (Signed)
Intolerant to statin medication.  Discussed zetia. She declines at this time.  Follow.  Low cholesterol diet and exercise.

## 2019-01-19 NOTE — Assessment & Plan Note (Signed)
Unable to take statin medication.  Discussed zetia.

## 2019-01-19 NOTE — Assessment & Plan Note (Signed)
Blood pressure her been under good control.  Continue same medication regimen.  Follow pressures.  Follow metabolic panel.

## 2019-01-19 NOTE — Assessment & Plan Note (Signed)
Colonoscopy 05/2013.  Recommended f/u in 5 years.  Will notify me when agreeable.

## 2019-01-19 NOTE — Assessment & Plan Note (Signed)
Low carb diet and exercise.  Follow met b and a1c.  

## 2019-02-03 ENCOUNTER — Other Ambulatory Visit: Payer: Self-pay | Admitting: Internal Medicine

## 2019-02-03 DIAGNOSIS — Z1231 Encounter for screening mammogram for malignant neoplasm of breast: Secondary | ICD-10-CM

## 2019-03-20 DIAGNOSIS — M170 Bilateral primary osteoarthritis of knee: Secondary | ICD-10-CM | POA: Diagnosis not present

## 2019-04-07 ENCOUNTER — Ambulatory Visit: Payer: Medicare Other | Admitting: Internal Medicine

## 2019-04-07 ENCOUNTER — Ambulatory Visit: Payer: Medicare Other

## 2019-04-09 ENCOUNTER — Other Ambulatory Visit: Payer: Self-pay

## 2019-04-09 ENCOUNTER — Ambulatory Visit (INDEPENDENT_AMBULATORY_CARE_PROVIDER_SITE_OTHER): Payer: Medicare Other

## 2019-04-09 DIAGNOSIS — Z0000 Encounter for general adult medical examination without abnormal findings: Secondary | ICD-10-CM

## 2019-04-09 NOTE — Progress Notes (Addendum)
Subjective:   Whitney Carr is a 73 y.o. female who presents for Medicare Annual (Subsequent) preventive examination.  Review of Systems:  No ROS.  Medicare Wellness Virtual Visit.  Visual/audio telehealth visit, UTA vital signs.   See social history for additional risk factors.   Cardiac Risk Factors include: advanced age (>71men, >18 women);hypertension     Objective:     Vitals: LMP 06/03/1986   There is no height or weight on file to calculate BMI.  Advanced Directives 04/09/2019 04/03/2018 04/02/2017 07/04/2016 07/04/2016  Does Patient Have a Medical Advance Directive? Yes Yes Yes Yes Yes;No  Type of Paramedic of [place];Living will [place];Living will Living will;Healthcare Power of Attorney Living will;Healthcare Power of Attorney -  Does patient want to make changes to medical advance directive? No - Patient declined No - Patient declined No - Patient declined - -  Copy of [place] in Chart? No - copy requested No - copy requested No - copy requested No - copy requested -    Tobacco Social History   Tobacco Use  Smoking Status Never Smoker  Smokeless Tobacco Never Used     Counseling given: Not Answered   Clinical Intake:  Pre-visit preparation completed: Yes        Diabetes: No  How often do you need to have someone help you when you read instructions, pamphlets, or other written materials from your doctor or pharmacy?: 1 - Never  Interpreter Needed?: No     Past Medical History:  Diagnosis Date  . Degenerative joint disease   . GERD (gastroesophageal reflux disease)   . Hypercholesterolemia   . Hypertension    Past Surgical History:  Procedure Laterality Date  . APPENDECTOMY  1969  . TONSILECTOMY/ADENOIDECTOMY WITH MYRINGOTOMY  1970  . TUBAL LIGATION  1969  . VAGINAL HYSTERECTOMY  1987   secondary to bleeding   Family History  Problem Relation Age of Onset  . Congestive  Heart Failure Mother   . Thyroid disease Mother   . Heart failure Father   . Diabetes Father   . Heart disease Sister        CABG  . Alzheimer's disease Sister   . Diabetes Sister   . Diabetes Sister   . Stroke Brother   . COPD Brother   . COPD Brother   . Colon cancer Brother   . Hypertension Brother   . Heart disease Brother   . Breast cancer Daughter   . Autoimmune disease Daughter    Social History   Socioeconomic History  . Marital status: Married    Spouse name: Not on file  . Number of children: 3  . Years of education: Not on file  . Highest education level: Not on file  Occupational History  . Not on file  Social Needs  . Financial resource strain: Not hard at all  . Food insecurity    Worry: Never true    Inability: Never true  . Transportation needs    Medical: No    Non-medical: No  Tobacco Use  . Smoking status: Never Smoker  . Smokeless tobacco: Never Used  Substance and Sexual Activity  . Alcohol use: No    Alcohol/week: 0.0 standard drinks  . Drug use: No  . Sexual activity: Yes  Lifestyle  . Physical activity    Days per week: 5 days    Minutes per session: 30 min  . Stress: Not at all  Relationships  . Social Herbalist on phone: Not on file    Gets together: Not on file    Attends religious service: Not on file    Active member of club or organization: Not on file    Attends meetings of clubs or organizations: Not on file    Relationship status: Not on file  Other Topics Concern  . Not on file  Social History Narrative  . Not on file    Outpatient Encounter Medications as of 04/09/2019  Medication Sig  . amLODipine (NORVASC) 2.5 MG tablet Take 1 tablet (2.5 mg total) by mouth daily.  Marland Kitchen aspirin 81 MG tablet Take 81 mg by mouth daily.  Marland Kitchen azelastine (ASTELIN) 0.1 % nasal spray Place 1 spray into both nostrils 2 (two) times daily. Use in each nostril as directed  . Calcium 600-200 MG-UNIT per tablet Take 1 tablet by mouth 2  (two) times daily.  . cholecalciferol (VITAMIN D) 1000 UNITS tablet Take 1,000 Units by mouth daily.  Marland Kitchen losartan (COZAAR) 100 MG tablet Take 1 tablet (100 mg total) by mouth daily.   No facility-administered encounter medications on file as of 04/09/2019.     Activities of Daily Living In your present state of health, do you have any difficulty performing the following activities: 04/09/2019  Hearing? N  Vision? N  Difficulty concentrating or making decisions? N  Walking or climbing stairs? N  Dressing or bathing? N  Doing errands, shopping? N  Preparing Food and eating ? N  Using the Toilet? N  In the past six months, have you accidently leaked urine? N  Do you have problems with loss of bowel control? N  Managing your Medications? N  Managing your Finances? N  Housekeeping or managing your Housekeeping? N  Some recent data might be hidden    Patient Care Team: Einar Pheasant, MD as PCP - General (Internal Medicine)    Assessment:   This is a routine wellness examination for [facility].  I connected with patient 04/09/19 at  9:00 AM EDT by an audio enabled telemedicine application and verified that I am speaking with the correct person using two identifiers. Patient stated full name and DOB. Patient gave permission to continue with virtual visit. Patient's location was at home and Nurse's location was at [place] office.   Health Maintenance Due: -Influenza vaccine 2020- discussed; to be completed in season with doctor or local pharmacy.   -Mammogram- scheduled 05/08/19 Update all pending maintenance due as appropriate.   See completed HM at the end of note.   Eye: Visual acuity not assessed. Virtual visit. Wears corrective lenses. Followed by their ophthalmologist.  Dental: Visits every 6 months.    Hearing: Demonstrates normal hearing during visit.  Safety:  Patient feels safe at home- yes Patient does have smoke detectors at home- yes Patient does wear sunscreen or  protective clothing when in direct sunlight - yes Patient does wear seat belt when in a moving vehicle - yes Patient drives- yes Adequate lighting in walkways free from debris- yes Grab bars and handrails used as appropriate- yes Ambulates with no assistive device Cell phone on person when ambulating outside of the home- yes  Social: Alcohol intake - no     Smoking history- never Smokers in home? none Illicit drug use? none  Depression: PHQ 2 &9 complete. See screening below. Denies irritability, anhedonia, sadness/tearfullness.    Falls: See screening below.    Medication: Taking as directed and without  issues.   Covid-19: Precautions and sickness symptoms discussed. Wears mask, social distancing, hand hygiene as appropriate.   Activities of Daily Living Patient denies needing assistance with: household chores, feeding themselves, getting from bed to chair, getting to the toilet, bathing/showering, dressing, managing money, or preparing meals.   Memory: Patient is alert. Patient denies difficulty focusing or concentrating. Correctly identified the president of the [place], season and recall. Patient likes to play word search for brain stimulation.  BMI- discussed the importance of a healthy diet, water intake and the benefits of aerobic exercise.  Educational material provided.  Physical activity- treadmill, weights every other day, 30- 40 minutes  Diet: Limited meat; healthy diet Water: good intake, 64+ Caffeine: 2-3 cups  Other Providers Patient Care Team: Einar Pheasant, MD as PCP - General (Internal Medicine)  Exercise Activities and Dietary recommendations Current Exercise Habits: Home exercise routine, Type of exercise: treadmill;walking;strength training/weights, Time (Minutes): 30, Frequency (Times/Week): 4, Weekly Exercise (Minutes/Week): 120, Intensity: Mild  Goals    . Follow up with Primary Care Provider     As needed       [place]   04/09/2019 04/03/2018 04/02/2017 07/04/2016 03/01/2016  Falls in the past year? 0 No No No No   Timed Get Up and Go performed: no, virtual visit  Depression Screen PHQ 2/9 Scores 04/09/2019 04/03/2018 04/02/2017 07/04/2016  PHQ - 2 Score 0 0 0 0     Cognitive Function MMSE - Mini Mental State Exam 04/03/2018 04/02/2017  Orientation to time 5 5  Orientation to Place 5 5  Registration 3 3  Attention/ Calculation 5 5  Recall 3 3  Language- name 2 objects 2 2  Language- repeat 1 1  Language- follow 3 step command 3 3  Language- read & follow direction 1 1  Write a sentence 1 1  Copy design 1 1  Total score 30 30     6CIT Screen 04/09/2019  What Year? 0 points  What month? 0 points  What time? 0 points  Count back from 20 0 points  Months in reverse 0 points  Repeat phrase 0 points  Total Score 0    Immunization History  Administered Date(s) Administered  . Influenza Split 05/06/2012  . Influenza, High Dose Seasonal PF 03/23/2015, 03/01/2016, 04/02/2017, 03/01/2018  . Influenza,inj,Quad PF,6+ Mos 03/09/2014  . Influenza-Unspecified 02/29/2016  . Pneumococcal Conjugate-13 01/20/2014  . Pneumococcal Polysaccharide-23 05/08/2012  . Tdap 01/31/2017  . Zoster Recombinat (Shingrix) 01/31/2017, 05/15/2017   Screening Tests Health Maintenance  Topic Date Due  . INFLUENZA VACCINE  01/25/2019  . MAMMOGRAM  04/04/2019  . COLONOSCOPY  06/18/2023  . TETANUS/TDAP  02/01/2027  . DEXA SCAN  Completed  . Hepatitis C Screening  Completed  . PNA vac Low Risk Adult  Completed      Plan:   Keep all routine maintenance appointments.   Nurse visit schedule for flu vaccine 04/10/19 @ 2:45  Next scheduled fasting lab 06/03/19  Follow up 06/03/19    Medicare Attestation I have personally reviewed: The patient's medical and social history Their use of alcohol, tobacco or illicit drugs Their current medications and supplements The patient's functional ability including ADLs,fall risks,  home safety risks, cognitive, and hearing and visual impairment Diet and physical activities Evidence for depression   In addition, I have reviewed and discussed with patient certain preventive protocols, quality metrics, and best practice recommendations. A written personalized care plan for preventive services as well as general preventive health  recommendations were provided to patient via mail.     Varney Biles, LPN  [id number]   Reviewed above information.  Agree with assessment and plan.    Dr Nicki Reaper

## 2019-04-09 NOTE — Patient Instructions (Addendum)
  Whitney Carr , Thank you for taking time to come for your Medicare Wellness Visit. I appreciate your ongoing commitment to your health goals. Please review the following plan we discussed and let me know if I can assist you in the future.   These are the goals we discussed: Goals    . Follow up with Primary Care Provider     As needed       This is a list of the screening recommended for you and due dates:  Health Maintenance  Topic Date Due  . Flu Shot  01/25/2019  . Mammogram  04/04/2019  . Colon Cancer Screening  06/18/2023  . Tetanus Vaccine  02/01/2027  . DEXA scan (bone density measurement)  Completed  .  Hepatitis C: One time screening is recommended by Center for Disease Control  (CDC) for  adults born from [birth year] through 1965.   Completed  . Pneumonia vaccines  Completed

## 2019-04-10 ENCOUNTER — Other Ambulatory Visit: Payer: Self-pay

## 2019-04-10 ENCOUNTER — Ambulatory Visit (INDEPENDENT_AMBULATORY_CARE_PROVIDER_SITE_OTHER): Payer: Medicare Other

## 2019-04-10 DIAGNOSIS — Z23 Encounter for immunization: Secondary | ICD-10-CM | POA: Diagnosis not present

## 2019-05-08 ENCOUNTER — Ambulatory Visit
Admission: RE | Admit: 2019-05-08 | Discharge: 2019-05-08 | Disposition: A | Discharge status: Home / Self Care | Payer: Medicare Other | Source: Ambulatory Visit | Attending: Internal Medicine | Admitting: Internal Medicine

## 2019-05-08 DIAGNOSIS — Z1231 Encounter for screening mammogram for malignant neoplasm of breast: Secondary | ICD-10-CM | POA: Insufficient documentation

## 2019-06-03 ENCOUNTER — Other Ambulatory Visit: Payer: Self-pay

## 2019-06-03 ENCOUNTER — Other Ambulatory Visit (INDEPENDENT_AMBULATORY_CARE_PROVIDER_SITE_OTHER): Payer: Medicare Other

## 2019-06-03 ENCOUNTER — Encounter: Payer: Self-pay | Admitting: Internal Medicine

## 2019-06-03 DIAGNOSIS — R739 Hyperglycemia, unspecified: Secondary | ICD-10-CM

## 2019-06-03 DIAGNOSIS — E7800 Pure hypercholesterolemia, unspecified: Secondary | ICD-10-CM

## 2019-06-03 DIAGNOSIS — I10 Essential (primary) hypertension: Secondary | ICD-10-CM

## 2019-06-03 LAB — HEPATIC FUNCTION PANEL
ALT: 10 U/L (ref 0–35)
AST: 11 U/L (ref 0–37)
Albumin: 4.5 g/dL (ref 3.5–5.2)
Alkaline Phosphatase: 62 U/L (ref 39–117)
Bilirubin, Direct: 0.1 mg/dL (ref 0.0–0.3)
Total Bilirubin: 0.7 mg/dL (ref 0.2–1.2)
Total Protein: 7 g/dL (ref 6.0–8.3)

## 2019-06-03 LAB — CBC WITH DIFFERENTIAL/PLATELET
Basophils Absolute: 0.1 10*3/uL (ref 0.0–0.1)
Basophils Relative: 1 % (ref 0.0–3.0)
Eosinophils Absolute: 0.1 10*3/uL (ref 0.0–0.7)
Eosinophils Relative: 1.3 % (ref 0.0–5.0)
HCT: 43 % (ref 36.0–46.0)
Hemoglobin: 14 g/dL (ref 12.0–15.0)
Lymphocytes Relative: 23.3 % (ref 12.0–46.0)
Lymphs Abs: 1.5 10*3/uL (ref 0.7–4.0)
MCHC: 32.7 g/dL (ref 30.0–36.0)
MCV: 93.9 fl (ref 78.0–100.0)
Monocytes Absolute: 0.5 10*3/uL (ref 0.1–1.0)
Monocytes Relative: 8.4 % (ref 3.0–12.0)
Neutro Abs: 4.3 10*3/uL (ref 1.4–7.7)
Neutrophils Relative %: 66 % (ref 43.0–77.0)
Platelets: 317 10*3/uL (ref 150.0–400.0)
RBC: 4.58 Mil/uL (ref 3.87–5.11)
RDW: 13.9 % (ref 11.5–15.5)
WBC: 6.4 10*3/uL (ref 4.0–10.5)

## 2019-06-03 LAB — LIPID PANEL
Cholesterol: 210 mg/dL — ABNORMAL HIGH (ref 0–200)
HDL: 58 mg/dL (ref >39.00)
LDL Cholesterol: 130 mg/dL — ABNORMAL HIGH (ref 0–99)
NonHDL: 152.23
Total CHOL/HDL Ratio: 4
Triglycerides: 113 mg/dL (ref 0.0–149.0)
VLDL: 22.6 mg/dL (ref 0.0–40.0)

## 2019-06-03 LAB — BASIC METABOLIC PANEL
BUN: 11 mg/dL (ref 6–23)
CO2: 27 mEq/L (ref 19–32)
Calcium: 9.7 mg/dL (ref 8.4–10.5)
Chloride: 100 mEq/L (ref 96–112)
Creatinine, Ser: 0.77 mg/dL (ref 0.40–1.20)
GFR: 73.41 mL/min (ref >60.00)
Glucose, Bld: 87 mg/dL (ref 70–99)
Potassium: 3.8 mEq/L (ref 3.5–5.1)
Sodium: 135 mEq/L (ref 135–145)

## 2019-06-03 LAB — HEMOGLOBIN A1C: Hgb A1c MFr Bld: 5.8 % (ref 4.6–6.5)

## 2019-06-05 ENCOUNTER — Other Ambulatory Visit: Payer: Self-pay

## 2019-06-05 ENCOUNTER — Encounter: Payer: Self-pay | Admitting: Internal Medicine

## 2019-06-05 ENCOUNTER — Telehealth: Payer: Self-pay | Admitting: Internal Medicine

## 2019-06-05 ENCOUNTER — Ambulatory Visit (INDEPENDENT_AMBULATORY_CARE_PROVIDER_SITE_OTHER): Payer: Medicare Other | Admitting: Internal Medicine

## 2019-06-05 DIAGNOSIS — G458 Other transient cerebral ischemic attacks and related syndromes: Secondary | ICD-10-CM | POA: Diagnosis not present

## 2019-06-05 DIAGNOSIS — E7800 Pure hypercholesterolemia, unspecified: Secondary | ICD-10-CM | POA: Diagnosis not present

## 2019-06-05 DIAGNOSIS — I700 Atherosclerosis of aorta: Secondary | ICD-10-CM

## 2019-06-05 DIAGNOSIS — I10 Essential (primary) hypertension: Secondary | ICD-10-CM | POA: Diagnosis not present

## 2019-06-05 DIAGNOSIS — R739 Hyperglycemia, unspecified: Secondary | ICD-10-CM

## 2019-06-05 NOTE — Assessment & Plan Note (Signed)
Low carb diet and exercise.  Follow met b and a1c.   Lab Results  Component Value Date   HGBA1C 5.8 06/03/2019

## 2019-06-05 NOTE — Assessment & Plan Note (Signed)
Intolerant to statin medication.  Discussed zetia.  Will notify me if desires to start zetia.  Low cholesterol diet and exercise.  Follow lipid panel.

## 2019-06-05 NOTE — Telephone Encounter (Signed)
Please contact pt and confirm last colonoscopy 05/2013.  If so, per note, recommended f/u colonoscopy in 5 years.  Overdue.  Need to refer to GI.  If agreeable, let me know and I will place the order for the referral.

## 2019-06-05 NOTE — Assessment & Plan Note (Signed)
S/p revascularization.  Continue risk factor modification.

## 2019-06-05 NOTE — Assessment & Plan Note (Signed)
Blood pressure under good control.  Continue same medication regimen.  Follow pressures.  Follow metabolic panel.   

## 2019-06-05 NOTE — Progress Notes (Signed)
Patient ID: Whitney Carr, female   DOB: 1946/02/15, 73 y.o.   MRN: [id number]   Virtual Visit via telephone Note  This visit type was conducted due to national recommendations for restrictions regarding the COVID-19 pandemic (e.g. social distancing).  This format is felt to be most appropriate for this patient at this time.  All issues noted in this document were discussed and addressed.  No physical exam was performed (except for noted visual exam findings with Video Visits).   I connected with Whitney Carr by telephone and verified that I am speaking with the correct person using two identifiers. Location patient: home Location provider: work Persons participating in the telephone visit: patient, provider  I discussed the limitations, risks, security and privacy concerns of performing an evaluation and management service by telephone and the availability of in person appointments. The patient expressed understanding and agreed to proceed.   Reason for visit: scheduled follow up.  HPI: She reports she is doing well.  Trying to stay in due to covid restrictions.  Stays active.  No chest pain.  No sob.  No acid reflux reported.  Bowels stable.  Blood pressure doing well.  Saw cardiology 10/2018.  Stable.  Recommended f/u in one year.  Discussed recent labs.  a1c stable 5.8.  Discussed cholesterol labs.  Intolerant to statin medication.  Discussed zetia.  Will let me know if decides to take.  Continue diet and exercise.  Knees doing better.  Had recent injection within the last couple of months.  Handling stress.    ROS: See pertinent positives and negatives per HPI.  Past Medical History:  Diagnosis Date  . Degenerative joint disease   . GERD (gastroesophageal reflux disease)   . Hypercholesterolemia   . Hypertension     Past Surgical History:  Procedure Laterality Date  . APPENDECTOMY  1969  . TONSILECTOMY/ADENOIDECTOMY WITH MYRINGOTOMY  1970  . TUBAL LIGATION  1969  . VAGINAL  HYSTERECTOMY  1987   secondary to bleeding    Family History  Problem Relation Age of Onset  . Congestive Heart Failure Mother   . Thyroid disease Mother   . Heart failure Father   . Diabetes Father   . Heart disease Sister        CABG  . Alzheimer's disease Sister   . Diabetes Sister   . Diabetes Sister   . Stroke Brother   . COPD Brother   . COPD Brother   . Colon cancer Brother   . Hypertension Brother   . Heart disease Brother   . Breast cancer Daughter   . Autoimmune disease Daughter     SOCIAL HX: reviewed.    Current Outpatient Medications:  .  amLODipine (NORVASC) 2.5 MG tablet, Take 1 tablet (2.5 mg total) by mouth daily., Disp: 90 tablet, Rfl: 3 .  aspirin 81 MG tablet, Take 81 mg by mouth daily., Disp: , Rfl:  .  azelastine (ASTELIN) 0.1 % nasal spray, Place 1 spray into both nostrils 2 (two) times daily. Use in each nostril as directed, Disp: 30 mL, Rfl: 1 .  Calcium 600-200 MG-UNIT per tablet, Take 1 tablet by mouth 2 (two) times daily., Disp: , Rfl:  .  cholecalciferol (VITAMIN D) 1000 UNITS tablet, Take 1,000 Units by mouth daily., Disp: , Rfl:  .  losartan (COZAAR) 100 MG tablet, Take 1 tablet (100 mg total) by mouth daily., Disp: 90 tablet, Rfl: 3  EXAM:  VITALS per patient if applicable: 638/93 (left) and  132/58 (right).  Pulse ox 98%  GENERAL: alert.  Sounds to be in no acute distress. Answering questions appropriately.   PSYCH/NEURO: pleasant and cooperative, no obvious depression or anxiety, speech and thought processing grossly intact  ASSESSMENT AND PLAN:  Discussed the following assessment and plan:  Aortic atherosclerosis ([place]) Unable to take statin medication.  Discussed zetia.   Hypercholesteremia Intolerant to statin medication.  Discussed zetia.  Will notify me if desires to start zetia.  Low cholesterol diet and exercise.  Follow lipid panel.   Hyperglycemia Low carb diet and exercise.  Follow met b and a1c.   Lab Results   Component Value Date   HGBA1C 5.8 06/03/2019    Hypertension Blood pressure under good control.  Continue same medication regimen.  Follow pressures.  Follow metabolic panel.    Subclavian steal syndrome S/p revascularization.  Continue risk factor modification.     I discussed the assessment and treatment plan with the patient. The patient was provided an opportunity to ask questions and all were answered. The patient agreed with the plan and demonstrated an understanding of the instructions.   The patient was advised to call back or seek an in-person evaluation if the symptoms worsen or if the condition fails to improve as anticipated.  I provided 16 minutes of non-face-to-face time during this encounter.   Einar Pheasant, MD

## 2019-06-05 NOTE — Assessment & Plan Note (Signed)
Unable to take statin medication.  Discussed zetia.

## 2019-06-06 NOTE — Telephone Encounter (Signed)
LMTCB

## 2019-06-11 NOTE — Telephone Encounter (Signed)
Patient would like to hold off on colonoscopy for now due to [place]

## 2019-08-04 DIAGNOSIS — M170 Bilateral primary osteoarthritis of knee: Secondary | ICD-10-CM | POA: Diagnosis not present

## 2019-08-25 ENCOUNTER — Other Ambulatory Visit: Payer: Self-pay | Admitting: Internal Medicine

## 2019-10-17 ENCOUNTER — Other Ambulatory Visit: Payer: Self-pay

## 2019-10-17 ENCOUNTER — Other Ambulatory Visit (INDEPENDENT_AMBULATORY_CARE_PROVIDER_SITE_OTHER): Payer: Medicare Other

## 2019-10-17 DIAGNOSIS — I10 Essential (primary) hypertension: Secondary | ICD-10-CM

## 2019-10-17 DIAGNOSIS — E7800 Pure hypercholesterolemia, unspecified: Secondary | ICD-10-CM | POA: Diagnosis not present

## 2019-10-17 DIAGNOSIS — R739 Hyperglycemia, unspecified: Secondary | ICD-10-CM | POA: Diagnosis not present

## 2019-10-17 LAB — HEPATIC FUNCTION PANEL
ALT: 10 U/L (ref 0–35)
AST: 14 U/L (ref 0–37)
Albumin: 4.3 g/dL (ref 3.5–5.2)
Alkaline Phosphatase: 58 U/L (ref 39–117)
Bilirubin, Direct: 0.1 mg/dL (ref 0.0–0.3)
Total Bilirubin: 0.8 mg/dL (ref 0.2–1.2)
Total Protein: 7.1 g/dL (ref 6.0–8.3)

## 2019-10-17 LAB — BASIC METABOLIC PANEL
BUN: 19 mg/dL (ref 6–23)
CO2: 27 mEq/L (ref 19–32)
Calcium: 9.7 mg/dL (ref 8.4–10.5)
Chloride: 100 mEq/L (ref 96–112)
Creatinine, Ser: 0.92 mg/dL (ref 0.40–1.20)
GFR: 59.72 mL/min — ABNORMAL LOW (ref >60.00)
Glucose, Bld: 102 mg/dL — ABNORMAL HIGH (ref 70–99)
Potassium: 4.6 mEq/L (ref 3.5–5.1)
Sodium: 136 mEq/L (ref 135–145)

## 2019-10-17 LAB — HEMOGLOBIN A1C: Hgb A1c MFr Bld: 5.7 % (ref 4.6–6.5)

## 2019-10-17 LAB — LIPID PANEL
Cholesterol: 217 mg/dL — ABNORMAL HIGH (ref 0–200)
HDL: 62 mg/dL (ref >39.00)
LDL Cholesterol: 141 mg/dL — ABNORMAL HIGH (ref 0–99)
NonHDL: 154.87
Total CHOL/HDL Ratio: 3
Triglycerides: 71 mg/dL (ref 0.0–149.0)
VLDL: 14.2 mg/dL (ref 0.0–40.0)

## 2019-10-20 ENCOUNTER — Other Ambulatory Visit: Payer: Self-pay

## 2019-10-20 ENCOUNTER — Ambulatory Visit (INDEPENDENT_AMBULATORY_CARE_PROVIDER_SITE_OTHER): Payer: Medicare Other | Admitting: Internal Medicine

## 2019-10-20 ENCOUNTER — Encounter: Payer: Self-pay | Admitting: Internal Medicine

## 2019-10-20 VITALS — BP 132/70 | HR 90 | Temp 98.6°F | Resp 16 | Ht 66.0 in | Wt 165.0 lb

## 2019-10-20 DIAGNOSIS — Z0000 Encounter for general adult medical examination without abnormal findings: Secondary | ICD-10-CM

## 2019-10-20 DIAGNOSIS — G458 Other transient cerebral ischemic attacks and related syndromes: Secondary | ICD-10-CM | POA: Diagnosis not present

## 2019-10-20 DIAGNOSIS — I10 Essential (primary) hypertension: Secondary | ICD-10-CM | POA: Diagnosis not present

## 2019-10-20 DIAGNOSIS — E7800 Pure hypercholesterolemia, unspecified: Secondary | ICD-10-CM

## 2019-10-20 DIAGNOSIS — I700 Atherosclerosis of aorta: Secondary | ICD-10-CM

## 2019-10-20 DIAGNOSIS — R739 Hyperglycemia, unspecified: Secondary | ICD-10-CM | POA: Diagnosis not present

## 2019-10-20 MED ORDER — EZETIMIBE 10 MG PO TABS: 10.0000 mg | ORAL_TABLET | Freq: Every day | ORAL | 2 refills | Status: DC

## 2019-10-20 NOTE — Assessment & Plan Note (Signed)
Physical today 10/20/19.  Mammogram 05/08/19 - Birads I.  Colonoscopy 05/2013.  Recommended f/u in 5 years.  Have discussed.  She has postponed due to covid.

## 2019-10-20 NOTE — Progress Notes (Signed)
Patient ID: Whitney Carr, female   DOB: 1946-03-26, 74 y.o.   MRN: [id number]   Subjective:    Patient ID: Whitney Carr, female    DOB: 1946/02/19, 74 y.o.   MRN: [id number]  HPI This visit occurred during the SARS-CoV-2 public health emergency.  Safety protocols were in place, including screening questions prior to the visit, additional usage of staff PPE, and extensive cleaning of exam room while observing appropriate contact time as indicated for disinfecting solutions.  Patient here for her physical exam.  She reports she is doing relatively well.  Staying in due to covid restrictions.  Is getting out in her yard more now.  No chest pain or sob.  No acid reflux or abdominal pain reported.  Bowels stable.  Discussed labs.  Intolerant to statin medication.  Agreeable to take zetia.  She has had both covid vaccines.  States blood pressures are averaging 130-132/<65.    Past Medical History:  Diagnosis Date  . Degenerative joint disease   . GERD (gastroesophageal reflux disease)   . Hypercholesterolemia   . Hypertension    Past Surgical History:  Procedure Laterality Date  . APPENDECTOMY  1969  . TONSILECTOMY/ADENOIDECTOMY WITH MYRINGOTOMY  1970  . TUBAL LIGATION  1969  . VAGINAL HYSTERECTOMY  1987   secondary to bleeding   Family History  Problem Relation Age of Onset  . Congestive Heart Failure Mother   . Thyroid disease Mother   . Heart failure Father   . Diabetes Father   . Heart disease Sister        CABG  . Alzheimer's disease Sister   . Diabetes Sister   . Diabetes Sister   . Stroke Brother   . COPD Brother   . COPD Brother   . Colon cancer Brother   . Hypertension Brother   . Heart disease Brother   . Breast cancer Daughter   . Autoimmune disease Daughter    Social History   Socioeconomic History  . Marital status: Married    Spouse name: Not on file  . Number of children: 3  . Years of education: Not on file  . Highest education level: Not on  file  Occupational History  . Not on file  Tobacco Use  . Smoking status: Never Smoker  . Smokeless tobacco: Never Used  Substance and Sexual Activity  . Alcohol use: No    Alcohol/week: 0.0 standard drinks  . Drug use: No  . Sexual activity: Yes  Other Topics Concern  . Not on file  Social History Narrative  . Not on file   Social Determinants of Health   Financial Resource Strain:   . Difficulty of Paying Living Expenses:   Food Insecurity:   . Worried About Charity fundraiser in the Last Year:   . Arboriculturist in the Last Year:   Transportation Needs:   . Film/video editor (Medical):   Marland Kitchen Lack of Transportation (Non-Medical):   Physical Activity:   . Days of Exercise per Week:   . Minutes of Exercise per Session:   Stress:   . Feeling of Stress :   Social Connections:   . Frequency of Communication with Friends and Family:   . Frequency of Social Gatherings with Friends and Family:   . Attends Religious Services:   . Active Member of Clubs or Organizations:   . Attends Archivist Meetings:   Marland Kitchen Marital Status:     Outpatient Encounter  Medications as of 10/20/2019  Medication Sig  . amLODipine (NORVASC) 2.5 MG tablet TAKE 1 TABLET BY MOUTH ONCE DAILY.  Marland Kitchen aspirin 81 MG tablet Take 81 mg by mouth daily.  Marland Kitchen azelastine (ASTELIN) 0.1 % nasal spray Place 1 spray into both nostrils 2 (two) times daily. Use in each nostril as directed  . Calcium 600-200 MG-UNIT per tablet Take 1 tablet by mouth 2 (two) times daily.  . cholecalciferol (VITAMIN D) 1000 UNITS tablet Take 1,000 Units by mouth daily.  Marland Kitchen ezetimibe (ZETIA) 10 MG tablet Take 1 tablet (10 mg total) by mouth daily.  Marland Kitchen losartan (COZAAR) 50 MG tablet TAKE (2) TABLETS BY MOUTH ONCE DAILY.   No facility-administered encounter medications on file as of 10/20/2019.    Review of Systems  Constitutional: Negative for appetite change and unexpected weight change.  HENT: Negative for congestion and sinus  pressure.   Eyes: Negative for pain and visual disturbance.  Respiratory: Negative for cough, chest tightness and shortness of breath.   Cardiovascular: Negative for chest pain, palpitations and leg swelling.  Gastrointestinal: Negative for abdominal pain, diarrhea, nausea and vomiting.  Genitourinary: Negative for difficulty urinating and dysuria.  Musculoskeletal: Negative for joint swelling and myalgias.  Skin: Negative for color change and rash.  Neurological: Negative for dizziness, light-headedness and headaches.  Hematological: Negative for adenopathy. Does not bruise/bleed easily.  Psychiatric/Behavioral: Negative for agitation and dysphoric mood.       Objective:    Physical Exam Vitals reviewed.  Constitutional:      General: She is not in acute distress.    Appearance: Normal appearance. She is well-developed.  HENT:     Head: Normocephalic and atraumatic.     Right Ear: External ear normal.     Left Ear: External ear normal.  Eyes:     General: No scleral icterus.       Right eye: No discharge.        Left eye: No discharge.     Conjunctiva/sclera: Conjunctivae normal.  Neck:     Thyroid: No thyromegaly.  Cardiovascular:     Rate and Rhythm: Normal rate and regular rhythm.     Comments: Radial pulse - equal bilateral.  Pulmonary:     Effort: No tachypnea, accessory muscle usage or respiratory distress.     Breath sounds: Normal breath sounds. No decreased breath sounds or wheezing.  Chest:     Breasts:        Right: No inverted nipple, mass, nipple discharge or tenderness (no axillary adenopathy).        Left: No inverted nipple, mass, nipple discharge or tenderness (no axilarry adenopathy).  Abdominal:     General: Bowel sounds are normal.     Palpations: Abdomen is soft.     Tenderness: There is no abdominal tenderness.  Musculoskeletal:        General: No swelling or tenderness.     Cervical back: Neck supple. No tenderness.     Comments: DP pulses  palpable and equal bilateral.    Lymphadenopathy:     Cervical: No cervical adenopathy.  Skin:    Findings: No erythema or rash.  Neurological:     Mental Status: She is alert and oriented to person, place, and time.  Psychiatric:        Behavior: Behavior normal.     BP 132/70   Pulse 90   Temp 98.6 F (37 C)   Resp 16   Ht 5' 6"  (1.676 m)  Wt 165 lb (74.8 kg)   LMP 06/03/1986   SpO2 97%   BMI 26.63 kg/m  Wt Readings from Last 3 Encounters:  10/20/19 165 lb (74.8 kg)  06/05/19 166 lb (75.3 kg)  01/14/19 164 lb (74.4 kg)     Lab Results  Component Value Date   WBC 6.4 06/03/2019   HGB 14.0 06/03/2019   HCT 43.0 06/03/2019   PLT 317.0 06/03/2019   GLUCOSE 102 (H) 10/17/2019   CHOL 217 (H) 10/17/2019   TRIG 71.0 10/17/2019   HDL 62.00 10/17/2019   LDLDIRECT 143.6 05/13/2012   LDLCALC 141 (H) 10/17/2019   ALT 10 10/17/2019   AST 14 10/17/2019   NA 136 10/17/2019   K 4.6 10/17/2019   CL 100 10/17/2019   CREATININE 0.92 10/17/2019   BUN 19 10/17/2019   CO2 27 10/17/2019   TSH 1.56 01/08/2019   HGBA1C 5.7 10/17/2019    MM 3D SCREEN BREAST BILATERAL  Result Date: 05/08/2019 CLINICAL DATA:  Screening. EXAM: DIGITAL SCREENING BILATERAL MAMMOGRAM WITH TOMO AND CAD COMPARISON:  Previous exam(s). ACR Breast Density Category c: The breast tissue is heterogeneously dense, which may obscure small masses. FINDINGS: There are no findings suspicious for malignancy. Images were processed with CAD. IMPRESSION: No mammographic evidence of malignancy. A result letter of this screening mammogram will be mailed directly to the patient. RECOMMENDATION: Screening mammogram in one year. (Code:SM-B-01Y) BI-RADS CATEGORY  1: Negative. Electronically Signed   By: Claudie Revering M.D.   On: 05/08/2019 17:11       Assessment & Plan:   Problem List Items Addressed This Visit    Aortic atherosclerosis ([place])    Intolerant to statin medication.  Agreeable to start zetia.  Follow lipid  panel.       Relevant Medications   ezetimibe (ZETIA) 10 MG tablet   Health care maintenance    Physical today 10/20/19.  Mammogram 05/08/19 - Bir[facility] I.  Colonoscopy 05/2013.  Recommended f/u in 5 years.  Have discussed.  She has postponed due to covid.        Hypercholesteremia    Unable to take statin medication.  Agreeable to take zetia.  Follow lipid panel.  Low cholesterol diet and exercise.        Relevant Medications   ezetimibe (ZETIA) 10 MG tablet   Other Relevant Orders   Lipid panel   Hepatic function panel   Hyperglycemia    Low carb diet and exercise.  Follow met b and a1c.       Relevant Orders   Hemoglobin A1c   Hypertension    Blood pressure as outlined.  Continue amlodipine and losartan.  Follow pressures.  Follow metabolic panel.       Relevant Medications   ezetimibe (ZETIA) 10 MG tablet   Other Relevant Orders   TSH   Basic metabolic panel   Subclavian steal syndrome    S/p revascularization.  Continue risk factor modification.        Relevant Medications   ezetimibe (ZETIA) 10 MG tablet       Einar Pheasant, MD

## 2019-10-26 ENCOUNTER — Encounter: Payer: Self-pay | Admitting: Internal Medicine

## 2019-10-26 NOTE — Assessment & Plan Note (Signed)
Low carb diet and exercise.  Follow met b and a1c.  

## 2019-10-26 NOTE — Assessment & Plan Note (Signed)
Blood pressure as outlined. Continue amlodipine and losartan.  Follow pressures.  Follow metabolic panel.  

## 2019-10-26 NOTE — Assessment & Plan Note (Signed)
Unable to take statin medication.  Agreeable to take zetia.  Follow lipid panel.  Low cholesterol diet and exercise.

## 2019-10-26 NOTE — Assessment & Plan Note (Signed)
S/p revascularization.  Continue risk factor modification.  

## 2019-10-26 NOTE — Assessment & Plan Note (Signed)
Intolerant to statin medication.  Agreeable to start zetia.  Follow lipid panel.

## 2019-11-18 DIAGNOSIS — I10 Essential (primary) hypertension: Secondary | ICD-10-CM | POA: Diagnosis not present

## 2019-11-18 DIAGNOSIS — G458 Other transient cerebral ischemic attacks and related syndromes: Secondary | ICD-10-CM | POA: Diagnosis not present

## 2019-11-18 DIAGNOSIS — I700 Atherosclerosis of aorta: Secondary | ICD-10-CM | POA: Diagnosis not present

## 2019-11-18 DIAGNOSIS — R072 Precordial pain: Secondary | ICD-10-CM | POA: Diagnosis not present

## 2019-11-24 IMAGING — MG DIGITAL SCREENING BILAT W/ TOMO W/ CAD
6 of 12 series · 6 of 36 positions shown · non-contrast
Comparison: Previous exam(s).

CLINICAL DATA: Screening.

EXAM:
DIGITAL SCREENING BILATERAL MAMMOGRAM WITH TOMO AND CAD

[L MLO synth-2D]
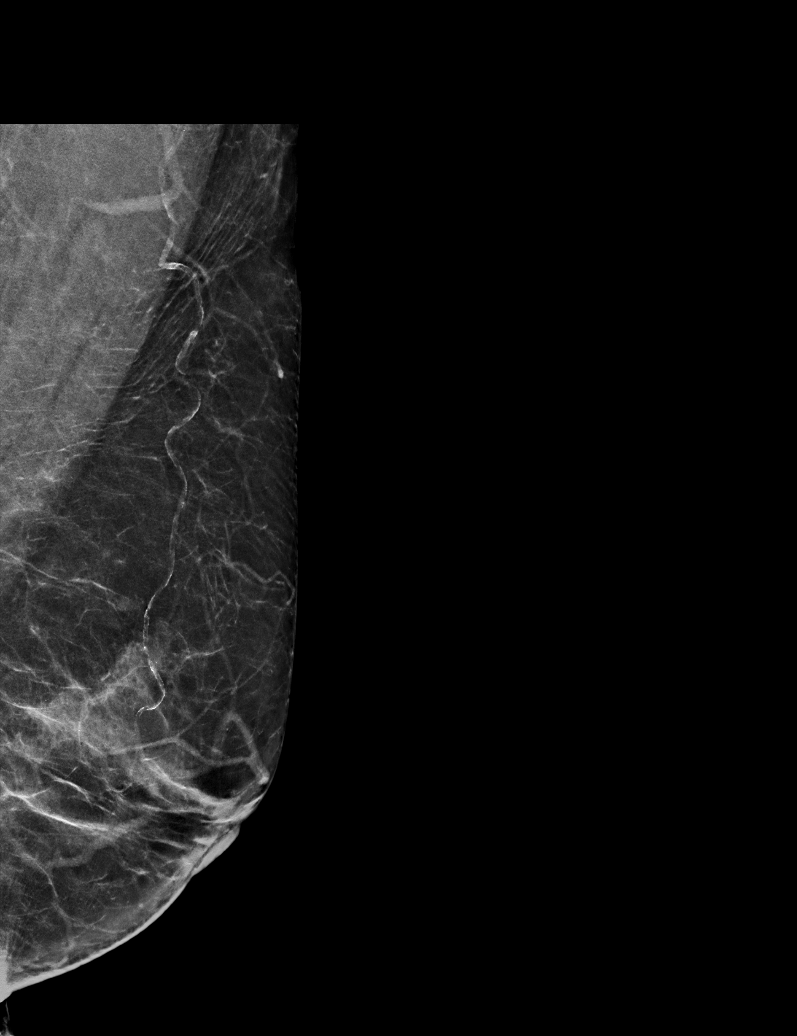

[R CC synth-2D]
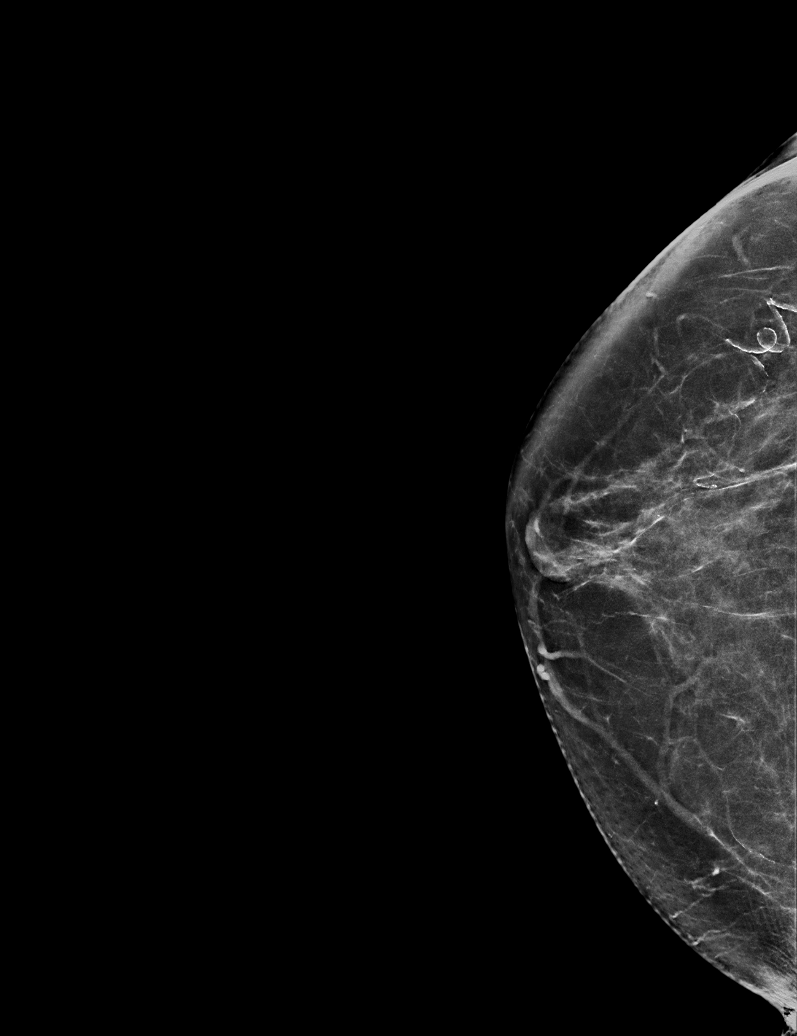

[R MLO synth-2D (1 of 2)]
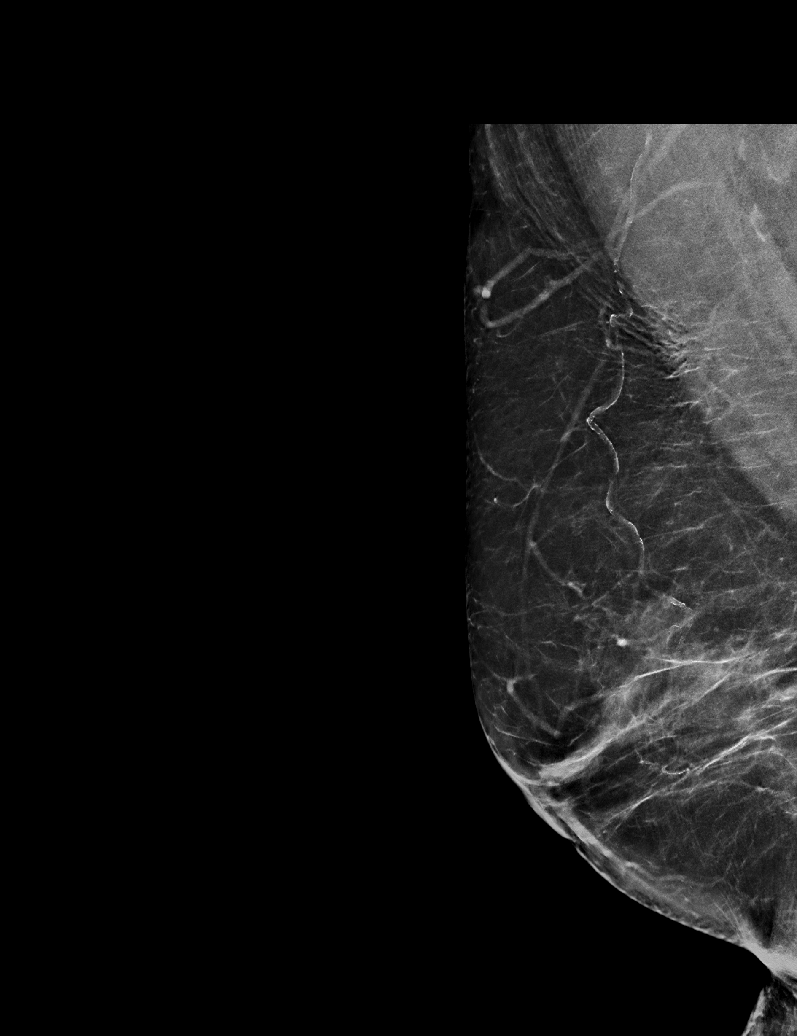

[R MLO synth-2D (2 of 2)]
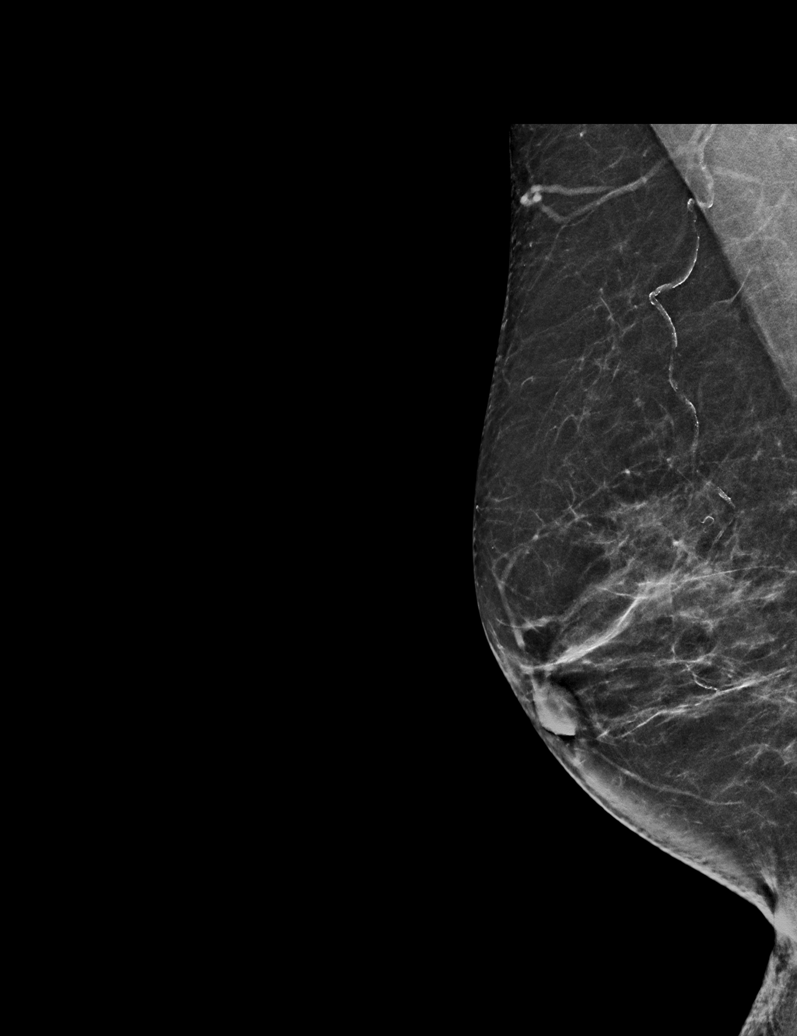

[L XCCL synth-2D]
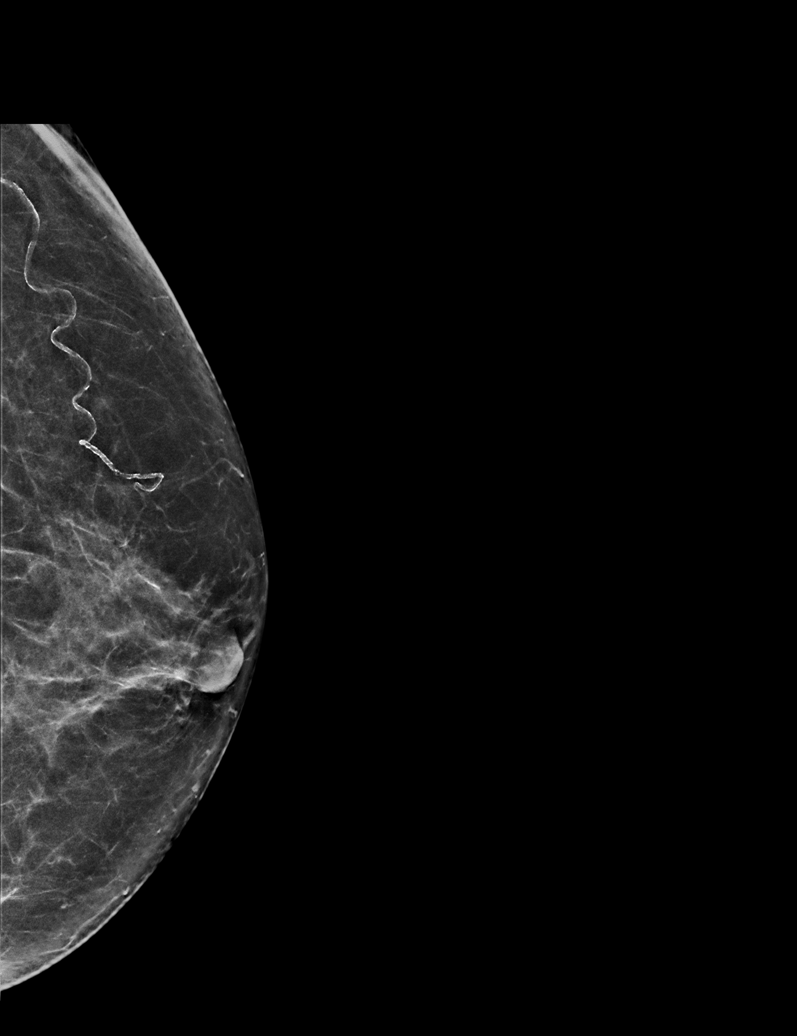

[L CC synth-2D]
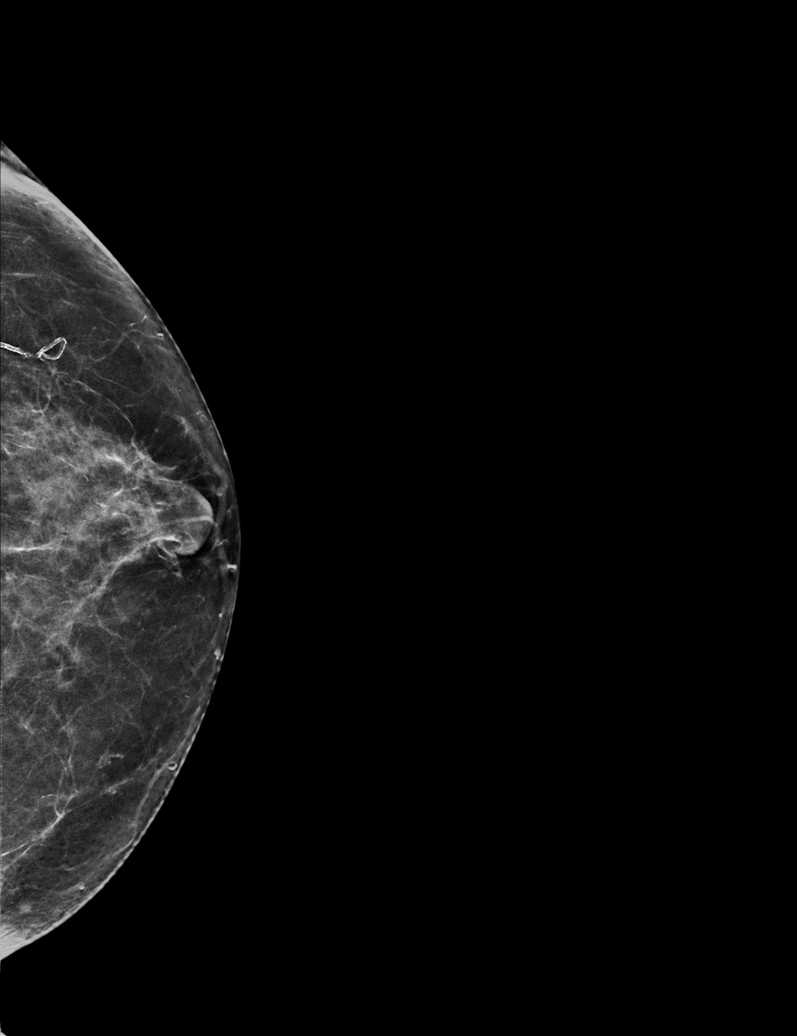

[6 of 36 positions shown; findings below may reference images not displayed]

ACR Breast Density Category c: The breast tissue is heterogeneously
dense, which may obscure small masses.
FINDINGS: There are no findings suspicious for malignancy. Images were
processed with CAD.
IMPRESSION: No mammographic evidence of malignancy. A result letter of this
screening mammogram will be mailed directly to the patient.

RECOMMENDATION:
Screening mammogram in one year. (Code:FT-U-LHB)

BI-RADS CATEGORY  1: Negative.

## 2020-01-19 DIAGNOSIS — I8312 Varicose veins of left lower extremity with inflammation: Secondary | ICD-10-CM | POA: Diagnosis not present

## 2020-01-19 DIAGNOSIS — I8311 Varicose veins of right lower extremity with inflammation: Secondary | ICD-10-CM | POA: Diagnosis not present

## 2020-02-02 DIAGNOSIS — M170 Bilateral primary osteoarthritis of knee: Secondary | ICD-10-CM | POA: Diagnosis not present

## 2020-02-03 DIAGNOSIS — I83893 Varicose veins of bilateral lower extremities with other complications: Secondary | ICD-10-CM | POA: Diagnosis not present

## 2020-02-03 DIAGNOSIS — I83813 Varicose veins of bilateral lower extremities with pain: Secondary | ICD-10-CM | POA: Diagnosis not present

## 2020-02-18 ENCOUNTER — Other Ambulatory Visit (INDEPENDENT_AMBULATORY_CARE_PROVIDER_SITE_OTHER): Payer: Medicare Other

## 2020-02-18 ENCOUNTER — Other Ambulatory Visit: Payer: Self-pay

## 2020-02-18 DIAGNOSIS — R739 Hyperglycemia, unspecified: Secondary | ICD-10-CM | POA: Diagnosis not present

## 2020-02-18 DIAGNOSIS — I10 Essential (primary) hypertension: Secondary | ICD-10-CM | POA: Diagnosis not present

## 2020-02-18 DIAGNOSIS — E7800 Pure hypercholesterolemia, unspecified: Secondary | ICD-10-CM | POA: Diagnosis not present

## 2020-02-18 LAB — LIPID PANEL
Cholesterol: 181 mg/dL (ref 0–200)
HDL: 57.1 mg/dL (ref >39.00)
LDL Cholesterol: 105 mg/dL — ABNORMAL HIGH (ref 0–99)
NonHDL: 124.04
Total CHOL/HDL Ratio: 3
Triglycerides: 93 mg/dL (ref 0.0–149.0)
VLDL: 18.6 mg/dL (ref 0.0–40.0)

## 2020-02-18 LAB — TSH: TSH: 2.05 u[IU]/mL (ref 0.35–4.50)

## 2020-02-18 LAB — BASIC METABOLIC PANEL
BUN: 20 mg/dL (ref 6–23)
CO2: 28 mEq/L (ref 19–32)
Calcium: 9.9 mg/dL (ref 8.4–10.5)
Chloride: 100 mEq/L (ref 96–112)
Creatinine, Ser: 0.85 mg/dL (ref 0.40–1.20)
GFR: 65.37 mL/min (ref >60.00)
Glucose, Bld: 92 mg/dL (ref 70–99)
Potassium: 4.4 mEq/L (ref 3.5–5.1)
Sodium: 135 mEq/L (ref 135–145)

## 2020-02-18 LAB — HEMOGLOBIN A1C: Hgb A1c MFr Bld: 5.9 % (ref 4.6–6.5)

## 2020-02-18 LAB — HEPATIC FUNCTION PANEL
ALT: 12 U/L (ref 0–35)
AST: 11 U/L (ref 0–37)
Albumin: 4.3 g/dL (ref 3.5–5.2)
Alkaline Phosphatase: 48 U/L (ref 39–117)
Bilirubin, Direct: 0.1 mg/dL (ref 0.0–0.3)
Total Bilirubin: 0.6 mg/dL (ref 0.2–1.2)
Total Protein: 7.4 g/dL (ref 6.0–8.3)

## 2020-02-20 ENCOUNTER — Other Ambulatory Visit: Payer: Self-pay

## 2020-02-20 ENCOUNTER — Encounter: Payer: Self-pay | Admitting: Internal Medicine

## 2020-02-20 ENCOUNTER — Telehealth (INDEPENDENT_AMBULATORY_CARE_PROVIDER_SITE_OTHER): Payer: Medicare Other | Admitting: Internal Medicine

## 2020-02-20 DIAGNOSIS — I10 Essential (primary) hypertension: Secondary | ICD-10-CM | POA: Diagnosis not present

## 2020-02-20 DIAGNOSIS — I700 Atherosclerosis of aorta: Secondary | ICD-10-CM

## 2020-02-20 DIAGNOSIS — R739 Hyperglycemia, unspecified: Secondary | ICD-10-CM | POA: Diagnosis not present

## 2020-02-20 DIAGNOSIS — E7800 Pure hypercholesterolemia, unspecified: Secondary | ICD-10-CM | POA: Diagnosis not present

## 2020-02-20 NOTE — Progress Notes (Signed)
Patient ID: Whitney Carr, female   DOB: 08/04/1945, 74 y.o.   MRN: [id number]   Virtual Visit via telephone Note  This visit type was conducted due to national recommendations for restrictions regarding the COVID-19 pandemic (e.g. social distancing).  This format is felt to be most appropriate for this patient at this time.  All issues noted in this document were discussed and addressed.  No physical exam was performed (except for noted visual exam findings with Video Visits).   I connected with Azariyah Fragoso today by telephone and verified that I am speaking with the correct person using two identifiers. Location patient: home Location provider: work  Persons participating in the telephone visit: patient, provider  The limitations, risks, security and privacy concerns of performing an evaluation and management service by telephone and the availability of in person appointments have been discussed.  It has also been discussed with the patient that there may be a patient responsible charge related to this service. The patient expressed understanding and agreed to proceed.   Reason for visit: scheduled follow up.   HPI: Reports increased stress.  Grandson was involved in MVA.  In ICU.  Discussed with her today.  Overall she feels she is handling things relatively well.  Has good support.  Does not feel needs anything more at this time.  Tries to stay active.  No chest pain or sob reported.  No abdominal pain or bowel change reported.  Discussed labs.  Was on zetia.  Caused constipation.  Off now for approximately 6 weeks.  Blood pressures 130s/50-60.     ROS: See pertinent positives and negatives per HPI.  Past Medical History:  Diagnosis Date  . Degenerative joint disease   . GERD (gastroesophageal reflux disease)   . Hypercholesterolemia   . Hypertension     Past Surgical History:  Procedure Laterality Date  . APPENDECTOMY  1969  . TONSILECTOMY/ADENOIDECTOMY WITH MYRINGOTOMY   1970  . TUBAL LIGATION  1969  . VAGINAL HYSTERECTOMY  1987   secondary to bleeding    Family History  Problem Relation Age of Onset  . Congestive Heart Failure Mother   . Thyroid disease Mother   . Heart failure Father   . Diabetes Father   . Heart disease Sister        CABG  . Alzheimer's disease Sister   . Diabetes Sister   . Diabetes Sister   . Stroke Brother   . COPD Brother   . COPD Brother   . Colon cancer Brother   . Hypertension Brother   . Heart disease Brother   . Breast cancer Daughter   . Autoimmune disease Daughter     SOCIAL HX: reviewed.    Current Outpatient Medications:  .  amLODipine (NORVASC) 2.5 MG tablet, TAKE 1 TABLET BY MOUTH ONCE DAILY., Disp: 90 tablet, Rfl: 3 .  aspirin 81 MG tablet, Take 81 mg by mouth daily., Disp: , Rfl:  .  azelastine (ASTELIN) 0.1 % nasal spray, Place 1 spray into both nostrils 2 (two) times daily. Use in each nostril as directed, Disp: 30 mL, Rfl: 1 .  Calcium 600-200 MG-UNIT per tablet, Take 1 tablet by mouth 2 (two) times daily., Disp: , Rfl:  .  cholecalciferol (VITAMIN D) 1000 UNITS tablet, Take 1,000 Units by mouth daily., Disp: , Rfl:  .  losartan (COZAAR) 50 MG tablet, TAKE (2) TABLETS BY MOUTH ONCE DAILY., Disp: 180 tablet, Rfl: 3  EXAM:  GENERAL: alert. Sounds to be  in no acute distress.  Answering questions appropriately.    PSYCH/NEURO: pleasant and cooperative, no obvious depression or anxiety, speech and thought processing grossly intact  ASSESSMENT AND PLAN:  Discussed the following assessment and plan:  Hypertension Blood pressure as outlined.  Continue amlodipine and losartan.  Follow pressures.  Follow metabolic panel.    Hyperglycemia Low carb diet and exercise.  Follow met b and a1c.   Hypercholesteremia Unable to take statin medication.  zetia caused constipation.  Low cholesterol diet and exercise.  Follow lipid panel and liver function tests.    Aortic atherosclerosis ([place]) Intolerant to  statin medication and zetia.  Low cholesterol diet and exercise.  Follow lipid panel.     Orders Placed This Encounter  Procedures  . CBC with Differential/Platelet    Standing Status:   Future    Standing Expiration Date:   02/28/2021  . Hemoglobin A1c    Standing Status:   Future    Standing Expiration Date:   02/28/2021  . Hepatic function panel    Standing Status:   Future    Standing Expiration Date:   02/28/2021  . Lipid panel    Standing Status:   Future    Standing Expiration Date:   02/28/2021  . Basic metabolic panel    Standing Status:   Future    Standing Expiration Date:   02/28/2021     I discussed the assessment and treatment plan with the patient. The patient was provided an opportunity to ask questions and all were answered. The patient agreed with the plan and demonstrated an understanding of the instructions.   The patient was advised to call back or seek an in-person evaluation if the symptoms worsen or if the condition fails to improve as anticipated.  I provided 23 minutes of non-face-to-face time during this encounter.   Einar Pheasant, MD

## 2020-02-29 ENCOUNTER — Encounter: Payer: Self-pay | Admitting: Internal Medicine

## 2020-02-29 NOTE — Assessment & Plan Note (Signed)
Low carb diet and exercise.  Follow met b and a1c.  

## 2020-02-29 NOTE — Assessment & Plan Note (Signed)
Unable to take statin medication.  zetia caused constipation.  Low cholesterol diet and exercise.  Follow lipid panel and liver function tests.

## 2020-02-29 NOTE — Assessment & Plan Note (Signed)
Intolerant to statin medication and zetia.  Low cholesterol diet and exercise.  Follow lipid panel.

## 2020-02-29 NOTE — Assessment & Plan Note (Signed)
Blood pressure as outlined. Continue amlodipine and losartan.  Follow pressures.  Follow metabolic panel.  

## 2020-03-08 ENCOUNTER — Other Ambulatory Visit: Payer: Self-pay | Admitting: Internal Medicine

## 2020-03-08 DIAGNOSIS — Z1231 Encounter for screening mammogram for malignant neoplasm of breast: Secondary | ICD-10-CM

## 2020-04-09 ENCOUNTER — Ambulatory Visit (INDEPENDENT_AMBULATORY_CARE_PROVIDER_SITE_OTHER): Payer: Medicare Other

## 2020-04-09 VITALS — Ht 66.0 in | Wt 165.0 lb

## 2020-04-09 DIAGNOSIS — Z0000 Encounter for general adult medical examination without abnormal findings: Secondary | ICD-10-CM | POA: Diagnosis not present

## 2020-04-09 NOTE — Patient Instructions (Addendum)
Whitney Carr , Thank you for taking time to come for your Medicare Wellness Visit. I appreciate your ongoing commitment to your health goals. Please review the following plan we discussed and let me know if I can assist you in the future.   These are the goals we discussed: Goals    . Follow up with Primary Care Provider     As needed       This is a list of the screening recommended for you and due dates:  Health Maintenance  Topic Date Due  . Flu Shot  09/23/2020*  . Mammogram  05/07/2020  . Colon Cancer Screening  06/18/2023  . Tetanus Vaccine  02/01/2027  . DEXA scan (bone density measurement)  Completed  . COVID-19 Vaccine  Completed  .  Hepatitis C: One time screening is recommended by Center for Disease Control  (CDC) for  adults born from [birth year] through 1965.   Completed  . Pneumonia vaccines  Completed  *Topic was postponed. The date shown is not the original due date.    Immunizations Immunization History  Administered Date(s) Administered  . Fluad Quad(high Dose 65+) 04/10/2019  . Influenza Split 05/06/2012  . Influenza, High Dose Seasonal PF 03/23/2015, 03/01/2016, 04/02/2017, 03/01/2018  . Influenza,inj,Quad PF,6+ Mos 03/09/2014  . Influenza-Unspecified 02/29/2016  . Moderna SARS-COVID-2 Vaccination 07/28/2019, 08/26/2019  . Pneumococcal Conjugate-13 01/20/2014  . Pneumococcal Polysaccharide-23 05/08/2012  . Tdap 01/31/2017  . Zoster Recombinat (Shingrix) 01/31/2017, 05/15/2017   Keep all routine maintenance appointments.   Next scheduled lab 06/30/20 @ 8:15  Follow up 07/02/20 @ 10:00  Follow up in one year for your annual wellness visit.   Preventive Care 74 Years and Older, Female Preventive care refers to lifestyle choices and visits with your health care provider that can promote health and wellness. What does preventive care include?  A yearly physical exam. This is also called an annual well check.  Dental exams once or twice a year.  Routine eye  exams. Ask your health care provider how often you should have your eyes checked.  Personal lifestyle choices, including:  Daily care of your teeth and gums.  Regular physical activity.  Eating a healthy diet.  Avoiding tobacco and drug use.  Limiting alcohol use.  Practicing safe sex.  Taking low-dose aspirin every day.  Taking vitamin and mineral supplements as recommended by your health care provider. What happens during an annual well check? The services and screenings done by your health care provider during your annual well check will depend on your age, overall health, lifestyle risk factors, and family history of disease. Counseling  Your health care provider may ask you questions about your:  Alcohol use.  Tobacco use.  Drug use.  Emotional well-being.  Home and relationship well-being.  Sexual activity.  Eating habits.  History of falls.  Memory and ability to understand (cognition).  Work and work Statistician.  Reproductive health. Screening  You may have the following tests or measurements:  Height, weight, and BMI.  Blood pressure.  Lipid and cholesterol levels. These may be checked every 5 years, or more frequently if you are over 74 years old.  Skin check.  Lung cancer screening. You may have this screening every year starting at age 74 if you have a 30-pack-year history of smoking and currently smoke or have quit within the past 15 years.  Fecal occult blood test (FOBT) of the stool. You may have this test every year starting at age 74.  Flexible  sigmoidoscopy or colonoscopy. You may have a sigmoidoscopy every 5 years or a colonoscopy every 10 years starting at age 74.  Hepatitis C blood test.  Hepatitis B blood test.  Sexually transmitted disease (STD) testing.  Diabetes screening. This is done by checking your blood sugar (glucose) after you have not eaten for a while (fasting). You may have this done every 1-3 years.  Bone  density scan. This is done to screen for osteoporosis. You may have this done starting at age 74.  Mammogram. This may be done every 1-2 years. Talk to your health care provider about how often you should have regular mammograms. Talk with your health care provider about your test results, treatment options, and if necessary, the need for more tests. Vaccines  Your health care provider may recommend certain vaccines, such as:  Influenza vaccine. This is recommended every year.  Tetanus, diphtheria, and acellular pertussis (Tdap, Td) vaccine. You may need a Td booster every 10 years.  Zoster vaccine. You may need this after age 74.  Pneumococcal 13-valent conjugate (PCV13) vaccine. One dose is recommended after age 74.  Pneumococcal polysaccharide (PPSV23) vaccine. One dose is recommended after age 74. Talk to your health care provider about which screenings and vaccines you need and how often you need them. This information is not intended to replace advice given to you by your health care provider. Make sure you discuss any questions you have with your health care provider. Document Released: 07/09/2015 Document Revised: 03/01/2016 Document Reviewed: 04/13/2015 Elsevier Interactive Patient Education  2017 [employer] Prevention in the Home Falls can cause injuries. They can happen to people of all ages. There are many things you can do to make your home safe and to help prevent falls. What can I do on the outside of my home?  Regularly fix the edges of walkways and driveways and fix any cracks.  Remove anything that might make you trip as you walk through a door, such as a raised step or threshold.  Trim any bushes or trees on the path to your home.  Use bright outdoor lighting.  Clear any walking paths of anything that might make someone trip, such as rocks or tools.  Regularly check to see if handrails are loose or broken. Make sure that both sides of any steps have  handrails.  Any raised decks and porches should have guardrails on the edges.  Have any leaves, snow, or ice cleared regularly.  Use sand or salt on walking paths during winter.  Clean up any spills in your garage right away. This includes oil or grease spills. What can I do in the bathroom?  Use night lights.  Install grab bars by the toilet and in the tub and shower. Do not use towel bars as grab bars.  Use non-skid mats or decals in the tub or shower.  If you need to sit down in the shower, use a plastic, non-slip stool.  Keep the floor dry. Clean up any water that spills on the floor as soon as it happens.  Remove soap buildup in the tub or shower regularly.  Attach bath mats securely with double-sided non-slip rug tape.  Do not have throw rugs and other things on the floor that can make you trip. What can I do in the bedroom?  Use night lights.  Make sure that you have a light by your bed that is easy to reach.  Do not use any sheets or blankets that  are too big for your bed. They should not hang down onto the floor.  Have a firm chair that has side arms. You can use this for support while you get dressed.  Do not have throw rugs and other things on the floor that can make you trip. What can I do in the kitchen?  Clean up any spills right away.  Avoid walking on wet floors.  Keep items that you use a lot in easy-to-reach places.  If you need to reach something above you, use a strong step stool that has a grab bar.  Keep electrical cords out of the way.  Do not use floor polish or wax that makes floors slippery. If you must use wax, use non-skid floor wax.  Do not have throw rugs and other things on the floor that can make you trip. What can I do with my stairs?  Do not leave any items on the stairs.  Make sure that there are handrails on both sides of the stairs and use them. Fix handrails that are broken or loose. Make sure that handrails are as long as  the stairways.  Check any carpeting to make sure that it is firmly attached to the stairs. Fix any carpet that is loose or worn.  Avoid having throw rugs at the top or bottom of the stairs. If you do have throw rugs, attach them to the floor with carpet tape.  Make sure that you have a light switch at the top of the stairs and the bottom of the stairs. If you do not have them, ask someone to add them for you. What else can I do to help prevent falls?  Wear shoes that:  Do not have high heels.  Have rubber bottoms.  Are comfortable and fit you well.  Are closed at the toe. Do not wear sandals.  If you use a stepladder:  Make sure that it is fully opened. Do not climb a closed stepladder.  Make sure that both sides of the stepladder are locked into place.  Ask someone to hold it for you, if possible.  Clearly mark and make sure that you can see:  Any grab bars or handrails.  First and last steps.  Where the edge of each step is.  Use tools that help you move around (mobility aids) if they are needed. These include:  Canes.  Walkers.  Scooters.  Crutches.  Turn on the lights when you go into a dark area. Replace any light bulbs as soon as they burn out.  Set up your furniture so you have a clear path. Avoid moving your furniture around.  If any of your floors are uneven, fix them.  If there are any pets around you, be aware of where they are.  Review your medicines with your doctor. Some medicines can make you feel dizzy. This can increase your chance of falling. Ask your doctor what other things that you can do to help prevent falls. This information is not intended to replace advice given to you by your health care provider. Make sure you discuss any questions you have with your health care provider. Document Released: 04/08/2009 Document Revised: 11/18/2015 Document Reviewed: 07/17/2014 Elsevier Interactive Patient Education  2017 [employer].

## 2020-04-09 NOTE — Progress Notes (Addendum)
Subjective:   Whitney Carr is a 74 y.o. female who presents for Medicare Annual (Subsequent) preventive examination.  Review of Systems    No ROS.  Medicare Wellness Virtual Visit.  Cardiac Risk Factors include: advanced age (>52men, >70 women)     Objective:    Today's Vitals   04/09/20 0907  Weight: 165 lb (74.8 kg)  Height: 5\' 6"  (1.676 m)   Body mass index is 26.63 kg/m.  Advanced Directives 04/09/2020 04/09/2019 04/03/2018 04/02/2017 07/04/2016 07/04/2016  Does Patient Have a Medical Advance Directive? Yes Yes Yes Yes Yes Yes;No  Type of Advance Directive Living will;Healthcare Power of [place];Living will [place];Living will Living will;Healthcare Power of Attorney Living will;Healthcare Power of Attorney -  Does patient want to make changes to medical advance directive? - No - Patient declined No - Patient declined No - Patient declined - -  Copy of [place] in Chart? No - copy requested No - copy requested No - copy requested No - copy requested No - copy requested -    Current Medications (verified) Outpatient Encounter Medications as of 04/09/2020  Medication Sig  . amLODipine (NORVASC) 2.5 MG tablet TAKE 1 TABLET BY MOUTH ONCE DAILY.  Marland Kitchen aspirin 81 MG tablet Take 81 mg by mouth daily.  Marland Kitchen azelastine (ASTELIN) 0.1 % nasal spray Place 1 spray into both nostrils 2 (two) times daily. Use in each nostril as directed  . Calcium 600-200 MG-UNIT per tablet Take 1 tablet by mouth 2 (two) times daily.  . cholecalciferol (VITAMIN D) 1000 UNITS tablet Take 1,000 Units by mouth daily.  Marland Kitchen losartan (COZAAR) 50 MG tablet TAKE (2) TABLETS BY MOUTH ONCE DAILY.   No facility-administered encounter medications on file as of 04/09/2020.    Allergies (verified) Patient has no known allergies.   History: Past Medical History:  Diagnosis Date  . Degenerative joint disease   . GERD (gastroesophageal reflux  disease)   . Hypercholesterolemia   . Hypertension    Past Surgical History:  Procedure Laterality Date  . APPENDECTOMY  1969  . TONSILECTOMY/ADENOIDECTOMY WITH MYRINGOTOMY  1970  . TUBAL LIGATION  1969  . VAGINAL HYSTERECTOMY  1987   secondary to bleeding   Family History  Problem Relation Age of Onset  . Congestive Heart Failure Mother   . Thyroid disease Mother   . Heart failure Father   . Diabetes Father   . Heart disease Sister        CABG  . Alzheimer's disease Sister   . Diabetes Sister   . Diabetes Sister   . Stroke Brother   . COPD Brother   . COPD Brother   . Colon cancer Brother   . Hypertension Brother   . Heart disease Brother   . Breast cancer Daughter   . Autoimmune disease Daughter    Social History   Socioeconomic History  . Marital status: Married    Spouse name: Not on file  . Number of children: 3  . Years of education: Not on file  . Highest education level: Not on file  Occupational History  . Not on file  Tobacco Use  . Smoking status: Never Smoker  . Smokeless tobacco: Never Used  Vaping Use  . Vaping Use: Never used  Substance and Sexual Activity  . Alcohol use: No    Alcohol/week: 0.0 standard drinks  . Drug use: No  . Sexual activity: Yes  Other Topics Concern  .  Not on file  Social History Narrative  . Not on file   Social Determinants of Health   Financial Resource Strain: Low Risk   . Difficulty of Paying Living Expenses: Not hard at all  Food Insecurity: No Food Insecurity  . Worried About Charity fundraiser in the Last Year: Never true  . Ran Out of Food in the Last Year: Never true  Transportation Needs: No Transportation Needs  . Lack of Transportation (Medical): No  . Lack of Transportation (Non-Medical): No  Physical Activity: Sufficiently Active  . Days of Exercise per Week: 5 days  . Minutes of Exercise per Session: 30 min  Stress: No Stress Concern Present  . Feeling of Stress : Not at all  Social  Connections: Unknown  . Frequency of Communication with Friends and Family: Not on file  . Frequency of Social Gatherings with Friends and Family: Not on file  . Attends Religious Services: Not on file  . Active Member of Clubs or Organizations: Not on file  . Attends Archivist Meetings: Not on file  . Marital Status: Married    Tobacco Counseling Counseling given: Not Answered   Clinical Intake:  Pre-visit preparation completed: Yes        Diabetes: No  How often do you need to have someone help you when you read instructions, pamphlets, or other written materials from your doctor or pharmacy?: 1 - Never       Activities of Daily Living In your present state of health, do you have any difficulty performing the following activities: 04/09/2020  Hearing? N  Vision? N  Difficulty concentrating or making decisions? N  Walking or climbing stairs? N  Dressing or bathing? N  Doing errands, shopping? N  Preparing Food and eating ? N  Using the Toilet? N  In the past six months, have you accidently leaked urine? N  Do you have problems with loss of bowel control? N  Managing your Medications? N  Managing your Finances? N  Housekeeping or managing your Housekeeping? N  Some recent data might be hidden    Patient Care Team: Einar Pheasant, MD as PCP - General (Internal Medicine)  Indicate any recent Medical Services you may have received from other than [facility] providers in the past year (date may be approximate).     Assessment:   This is a routine wellness examination for Whitney Carr.  I connected with [name] today by telephone and verified that I am speaking with the correct person using two identifiers. Location patient: home Location provider: work Persons participating in the virtual visit: patient, Marine scientist.    I discussed the limitations, risks, security and privacy concerns of performing an evaluation and management service by telephone and the availability  of in person appointments. The patient expressed understanding and verbally consented to this telephonic visit.    Interactive audio and video telecommunications were attempted between this provider and patient, however failed, due to patient having technical difficulties OR patient did not have access to video capability.  We continued and completed visit with audio only.  Some vital signs may be absent or patient reported.   Hearing/Vision screen  Hearing Screening   125Hz  250Hz  500Hz  1000Hz  2000Hz  3000Hz  4000Hz  6000Hz  8000Hz   Right ear:           Left ear:           Comments: Patient is able to hear conversational tones without difficulty. No issues reported  Vision Screening Comments: Visual  acuity not assessed, virtual visit.      Dietary issues and exercise activities discussed: Current Exercise Habits: Home exercise routine, Type of exercise: calisthenics;walking, Intensity: Mild  Goals    . Follow up with Primary Care Provider     As needed      Depression Screen PHQ 2/9 Scores 04/09/2020 04/09/2019 04/03/2018 04/02/2017 07/04/2016 03/01/2016 11/12/2014  PHQ - 2 Score 0 0 0 0 0 0 0    Fall Risk Fall Risk  04/09/2020 04/09/2019 04/03/2018 04/02/2017 07/04/2016  Falls in the past year? 0 0 No No No  Number falls in past yr: 0 - - - -  Follow up Falls evaluation completed - - - -   Handrails in use when climbing stairs? Yes Home free of loose throw rugs in walkways, pet beds, electrical cords, etc? Yes  Adequate lighting in your home to reduce risk of falls? Yes   ASSISTIVE DEVICES UTILIZED TO PREVENT FALLS: Use of a cane, walker or w/c? No   TIMED UP AND GO: Was the test performed? No . Virtual visit.   Cognitive Function: MMSE - Mini Mental State Exam 04/03/2018 04/02/2017  Orientation to time 5 5  Orientation to Place 5 5  Registration 3 3  Attention/ Calculation 5 5  Recall 3 3  Language- name 2 objects 2 2  Language- repeat 1 1  Language- follow 3 step command 3 3    Language- read & follow direction 1 1  Write a sentence 1 1  Copy design 1 1  Total score 30 30     6CIT Screen 04/09/2020 04/09/2019  What Year? 0 points 0 points  What month? 0 points 0 points  What time? 0 points 0 points  Count back from 20 - 0 points  Months in reverse - 0 points  Repeat phrase - 0 points  Total Score - 0    Immunizations Immunization History  Administered Date(s) Administered  . Fluad Quad(high Dose 65+) 04/10/2019  . Influenza Split 05/06/2012  . Influenza, High Dose Seasonal PF 03/23/2015, 03/01/2016, 04/02/2017, 03/01/2018  . Influenza,inj,Quad PF,6+ Mos 03/09/2014  . Influenza-Unspecified 02/29/2016  . Moderna SARS-COVID-2 Vaccination 07/28/2019, 08/26/2019  . Pneumococcal Conjugate-13 01/20/2014  . Pneumococcal Polysaccharide-23 05/08/2012  . Tdap 01/31/2017  . Zoster Recombinat (Shingrix) 01/31/2017, 05/15/2017    Health Maintenance There are no preventive care reminders to display for this patient. Health Maintenance  Topic Date Due  . INFLUENZA VACCINE  09/23/2020 (Originally 01/25/2020)  . MAMMOGRAM  05/07/2020  . COLONOSCOPY  06/18/2023  . TETANUS/TDAP  02/01/2027  . DEXA SCAN  Completed  . COVID-19 Vaccine  Completed  . Hepatitis C Screening  Completed  . PNA vac Low Risk Adult  Completed   Dental Screening: Recommended annual dental exams for proper oral hygiene.   Community Resource Referral / Chronic Care Management: CRR required this visit?  No   CCM required this visit?  No      Plan:   Keep all routine maintenance appointments.   Next scheduled lab 06/30/20 @ 8:15  Follow up 07/02/20 @ 10:00  I have personally reviewed and noted the following in the patient's chart:   . Medical and social history . Use of alcohol, tobacco or illicit drugs  . Current medications and supplements . Functional ability and status . Nutritional status . Physical activity . Advanced directives . List of other  physicians . Hospitalizations, surgeries, and ER visits in previous 12 months . Vitals . Screenings to include cognitive,  depression, and falls . Referrals and appointments  In addition, I have reviewed and discussed with patient certain preventive protocols, quality metrics, and best practice recommendations. A written personalized care plan for preventive services as well as general preventive health recommendations were provided to patient.     Varney Biles, LPN   08/25/4994   Reviewed above information.  Agree with assessment and plan.    Dr Nicki Reaper

## 2020-05-11 ENCOUNTER — Ambulatory Visit
Admission: RE | Admit: 2020-05-11 | Discharge: 2020-05-11 | Disposition: A | Discharge status: Home / Self Care | Payer: Medicare Other | Source: Ambulatory Visit | Attending: Internal Medicine | Admitting: Internal Medicine

## 2020-05-11 ENCOUNTER — Other Ambulatory Visit: Payer: Self-pay

## 2020-05-11 DIAGNOSIS — Z1231 Encounter for screening mammogram for malignant neoplasm of breast: Secondary | ICD-10-CM | POA: Diagnosis not present

## 2020-06-30 ENCOUNTER — Other Ambulatory Visit: Payer: Self-pay

## 2020-06-30 ENCOUNTER — Other Ambulatory Visit (INDEPENDENT_AMBULATORY_CARE_PROVIDER_SITE_OTHER): Payer: Medicare Other

## 2020-06-30 DIAGNOSIS — E7800 Pure hypercholesterolemia, unspecified: Secondary | ICD-10-CM

## 2020-06-30 DIAGNOSIS — R739 Hyperglycemia, unspecified: Secondary | ICD-10-CM | POA: Diagnosis not present

## 2020-06-30 DIAGNOSIS — I10 Essential (primary) hypertension: Secondary | ICD-10-CM | POA: Diagnosis not present

## 2020-06-30 LAB — BASIC METABOLIC PANEL
BUN: 19 mg/dL (ref 6–23)
CO2: 27 mEq/L (ref 19–32)
Calcium: 9.7 mg/dL (ref 8.4–10.5)
Chloride: 101 mEq/L (ref 96–112)
Creatinine, Ser: 0.88 mg/dL (ref 0.40–1.20)
GFR: 64.75 mL/min (ref >60.00)
Glucose, Bld: 101 mg/dL — ABNORMAL HIGH (ref 70–99)
Potassium: 4.3 mEq/L (ref 3.5–5.1)
Sodium: 134 mEq/L — ABNORMAL LOW (ref 135–145)

## 2020-06-30 LAB — HEPATIC FUNCTION PANEL
ALT: 9 U/L (ref 0–35)
AST: 12 U/L (ref 0–37)
Albumin: 4.4 g/dL (ref 3.5–5.2)
Alkaline Phosphatase: 54 U/L (ref 39–117)
Bilirubin, Direct: 0.1 mg/dL (ref 0.0–0.3)
Total Bilirubin: 0.7 mg/dL (ref 0.2–1.2)
Total Protein: 7.2 g/dL (ref 6.0–8.3)

## 2020-06-30 LAB — CBC WITH DIFFERENTIAL/PLATELET
Basophils Absolute: 0 10*3/uL (ref 0.0–0.1)
Basophils Relative: 0.8 % (ref 0.0–3.0)
Eosinophils Absolute: 0 10*3/uL (ref 0.0–0.7)
Eosinophils Relative: 0.5 % (ref 0.0–5.0)
HCT: 43.2 % (ref 36.0–46.0)
Hemoglobin: 14.2 g/dL (ref 12.0–15.0)
Lymphocytes Relative: 21.7 % (ref 12.0–46.0)
Lymphs Abs: 1.3 10*3/uL (ref 0.7–4.0)
MCHC: 32.9 g/dL (ref 30.0–36.0)
MCV: 92.1 fl (ref 78.0–100.0)
Monocytes Absolute: 0.5 10*3/uL (ref 0.1–1.0)
Monocytes Relative: 8.6 % (ref 3.0–12.0)
Neutro Abs: 3.9 10*3/uL (ref 1.4–7.7)
Neutrophils Relative %: 68.4 % (ref 43.0–77.0)
Platelets: 299 10*3/uL (ref 150.0–400.0)
RBC: 4.69 Mil/uL (ref 3.87–5.11)
RDW: 13 % (ref 11.5–15.5)
WBC: 5.8 10*3/uL (ref 4.0–10.5)

## 2020-06-30 LAB — LIPID PANEL
Cholesterol: 223 mg/dL — ABNORMAL HIGH (ref 0–200)
HDL: 57.5 mg/dL (ref >39.00)
LDL Cholesterol: 143 mg/dL — ABNORMAL HIGH (ref 0–99)
NonHDL: 165.78
Total CHOL/HDL Ratio: 4
Triglycerides: 113 mg/dL (ref 0.0–149.0)
VLDL: 22.6 mg/dL (ref 0.0–40.0)

## 2020-06-30 LAB — HEMOGLOBIN A1C: Hgb A1c MFr Bld: 5.6 % (ref 4.6–6.5)

## 2020-07-02 ENCOUNTER — Other Ambulatory Visit: Payer: Self-pay

## 2020-07-02 ENCOUNTER — Encounter: Payer: Self-pay | Admitting: Internal Medicine

## 2020-07-02 ENCOUNTER — Telehealth (INDEPENDENT_AMBULATORY_CARE_PROVIDER_SITE_OTHER): Payer: Medicare Other | Admitting: Internal Medicine

## 2020-07-02 DIAGNOSIS — I10 Essential (primary) hypertension: Secondary | ICD-10-CM

## 2020-07-02 DIAGNOSIS — I700 Atherosclerosis of aorta: Secondary | ICD-10-CM

## 2020-07-02 DIAGNOSIS — E7800 Pure hypercholesterolemia, unspecified: Secondary | ICD-10-CM

## 2020-07-02 DIAGNOSIS — R739 Hyperglycemia, unspecified: Secondary | ICD-10-CM | POA: Diagnosis not present

## 2020-07-02 MED ORDER — AMLODIPINE BESYLATE 2.5 MG PO TABS: 2.5000 mg | ORAL_TABLET | Freq: Every day | ORAL | 3 refills | Status: DC

## 2020-07-02 MED ORDER — LOSARTAN POTASSIUM 50 MG PO TABS: ORAL_TABLET | ORAL | 3 refills | Status: DC

## 2020-07-02 NOTE — Progress Notes (Signed)
Patient ID: Whitney Carr, female   DOB: Aug 16, 1945, 75 y.o.   MRN: [id number]   Virtual Visit via telephone Note  This visit type was conducted due to national recommendations for restrictions regarding the COVID-19 pandemic (e.g. social distancing).  This format is felt to be most appropriate for this patient at this time.  All issues noted in this document were discussed and addressed.  No physical exam was performed (except for noted visual exam findings with Video Visits).   I connected with Kyndahl Gootee by telephone and verified that I am speaking with the correct person using two identifiers. Location patient: home Location provider: work  Persons participating in the virtual visit: patient, provider  The limitations, risks, security and privacy concerns of performing an evaluation and management service by telephone and the availability of in person appointments. I also discussed with the patient that there may be a patient responsible charge related to this service. The patient expressed understanding and agreed to proceed.   Reason for visit: scheduled follow up.   HPI: Follow up regarding her blood pressure, blood sugar and cholesterol.  She reports she is doing relatively well.  Tries to stay active.  No chest pain or sob reported.  No abdominal pain or bowel change reported.  No nausea now.  Appetite improved.  Granddaughter with covid.  She has not been around her granddaughter since Christmas. Handling stress.     ROS: See pertinent positives and negatives per HPI.  Past Medical History:  Diagnosis Date  . Degenerative joint disease   . GERD (gastroesophageal reflux disease)   . Hypercholesterolemia   . Hypertension     Past Surgical History:  Procedure Laterality Date  . APPENDECTOMY  1969  . TONSILECTOMY/ADENOIDECTOMY WITH MYRINGOTOMY  1970  . TUBAL LIGATION  1969  . VAGINAL HYSTERECTOMY  1987   secondary to bleeding    Family History  Problem Relation Age  of Onset  . Congestive Heart Failure Mother   . Thyroid disease Mother   . Heart failure Father   . Diabetes Father   . Heart disease Sister        CABG  . Alzheimer's disease Sister   . Diabetes Sister   . Diabetes Sister   . Stroke Brother   . COPD Brother   . COPD Brother   . Colon cancer Brother   . Hypertension Brother   . Heart disease Brother   . Breast cancer Daughter   . Autoimmune disease Daughter     SOCIAL HX: reviewed.    Current Outpatient Medications:  .  amLODipine (NORVASC) 2.5 MG tablet, Take 1 tablet (2.5 mg total) by mouth daily., Disp: 90 tablet, Rfl: 3 .  aspirin 81 MG tablet, Take 81 mg by mouth daily., Disp: , Rfl:  .  azelastine (ASTELIN) 0.1 % nasal spray, Place 1 spray into both nostrils 2 (two) times daily. Use in each nostril as directed, Disp: 30 mL, Rfl: 1 .  Calcium 600-200 MG-UNIT per tablet, Take 1 tablet by mouth 2 (two) times daily., Disp: , Rfl:  .  cholecalciferol (VITAMIN D) 1000 UNITS tablet, Take 1,000 Units by mouth daily., Disp: , Rfl:  .  losartan (COZAAR) 50 MG tablet, TAKE (2) TABLETS BY MOUTH ONCE DAILY., Disp: 180 tablet, Rfl: 3  EXAM:  GENERAL: alert, oriented, appears well and in no acute distress  HEENT: atraumatic, conjunttiva clear, no obvious abnormalities on inspection of external nose and ears  NECK: normal movements of the  head and neck  LUNGS: on inspection no signs of respiratory distress, breathing rate appears normal, no obvious gross SOB, gasping or wheezing  CV: no obvious cyanosis  PSYCH/NEURO: pleasant and cooperative, no obvious depression or anxiety, speech and thought processing grossly intact  ASSESSMENT AND PLAN:  Discussed the following assessment and plan:  Problem List Items Addressed This Visit    Hypertension    Continue amlodipine and losartan.  Blood pressures have been doing well.  Follow pressures.  GFR - 65 on recent check.        Relevant Medications   losartan (COZAAR) 50 MG tablet    amLODipine (NORVASC) 2.5 MG tablet   Other Relevant Orders   Basic metabolic panel   Hypercholesteremia    Unable to take statin medication.  Constipation with zetia.  Low cholesterol diet and exercise.  Follow lipid panel.       Relevant Medications   losartan (COZAAR) 50 MG tablet   amLODipine (NORVASC) 2.5 MG tablet   Other Relevant Orders   Hepatic function panel   Lipid panel   Hyperglycemia    Low carb diet and exercise.  a1c just checked 5.6.        Relevant Orders   Hemoglobin A1c   Aortic atherosclerosis (HCC)    Intolerant to statin and zetia.  Discussed repatha.  Follow.       Relevant Medications   losartan (COZAAR) 50 MG tablet   amLODipine (NORVASC) 2.5 MG tablet       I discussed the assessment and treatment plan with the patient. The patient was provided an opportunity to ask questions and all were answered. The patient agreed with the plan and demonstrated an understanding of the instructions.   The patient was advised to call back or seek an in-person evaluation if the symptoms worsen or if the condition fails to improve as anticipated.  I provided 23 minutes of non-face-to-face time during this encounter.   Einar Pheasant, MD

## 2020-07-04 ENCOUNTER — Encounter: Payer: Self-pay | Admitting: Internal Medicine

## 2020-07-04 NOTE — Assessment & Plan Note (Signed)
Low carb diet and exercise.  a1c just checked 5.6.

## 2020-07-04 NOTE — Assessment & Plan Note (Signed)
Intolerant to statin and zetia.  Discussed repatha.  Follow.

## 2020-07-04 NOTE — Assessment & Plan Note (Signed)
Continue amlodipine and losartan.  Blood pressures have been doing well.  Follow pressures.  GFR - 65 on recent check.

## 2020-07-04 NOTE — Assessment & Plan Note (Signed)
Unable to take statin medication.  Constipation with zetia.  Low cholesterol diet and exercise.  Follow lipid panel.

## 2020-07-05 DIAGNOSIS — M170 Bilateral primary osteoarthritis of knee: Secondary | ICD-10-CM | POA: Diagnosis not present

## 2020-10-04 DIAGNOSIS — M170 Bilateral primary osteoarthritis of knee: Secondary | ICD-10-CM | POA: Diagnosis not present

## 2020-11-09 ENCOUNTER — Other Ambulatory Visit: Payer: Self-pay

## 2020-11-09 ENCOUNTER — Other Ambulatory Visit (INDEPENDENT_AMBULATORY_CARE_PROVIDER_SITE_OTHER): Payer: Medicare Other

## 2020-11-09 DIAGNOSIS — R739 Hyperglycemia, unspecified: Secondary | ICD-10-CM | POA: Diagnosis not present

## 2020-11-09 DIAGNOSIS — I10 Essential (primary) hypertension: Secondary | ICD-10-CM

## 2020-11-09 DIAGNOSIS — E7800 Pure hypercholesterolemia, unspecified: Secondary | ICD-10-CM

## 2020-11-09 LAB — LIPID PANEL
Cholesterol: 188 mg/dL (ref 0–200)
HDL: 60.3 mg/dL (ref >39.00)
LDL Cholesterol: 105 mg/dL — ABNORMAL HIGH (ref 0–99)
NonHDL: 127.24
Total CHOL/HDL Ratio: 3
Triglycerides: 112 mg/dL (ref 0.0–149.0)
VLDL: 22.4 mg/dL (ref 0.0–40.0)

## 2020-11-09 LAB — BASIC METABOLIC PANEL
BUN: 29 mg/dL — ABNORMAL HIGH (ref 6–23)
CO2: 26 mEq/L (ref 19–32)
Calcium: 10 mg/dL (ref 8.4–10.5)
Chloride: 100 mEq/L (ref 96–112)
Creatinine, Ser: 0.79 mg/dL (ref 0.40–1.20)
GFR: 73.51 mL/min (ref >60.00)
Glucose, Bld: 99 mg/dL (ref 70–99)
Potassium: 4.1 mEq/L (ref 3.5–5.1)
Sodium: 135 mEq/L (ref 135–145)

## 2020-11-09 LAB — HEPATIC FUNCTION PANEL
ALT: 11 U/L (ref 0–35)
AST: 12 U/L (ref 0–37)
Albumin: 4.5 g/dL (ref 3.5–5.2)
Alkaline Phosphatase: 55 U/L (ref 39–117)
Bilirubin, Direct: 0.2 mg/dL (ref 0.0–0.3)
Total Bilirubin: 0.7 mg/dL (ref 0.2–1.2)
Total Protein: 7.3 g/dL (ref 6.0–8.3)

## 2020-11-09 LAB — HEMOGLOBIN A1C: Hgb A1c MFr Bld: 5.8 % (ref 4.6–6.5)

## 2020-11-12 ENCOUNTER — Ambulatory Visit (INDEPENDENT_AMBULATORY_CARE_PROVIDER_SITE_OTHER): Payer: Medicare Other | Admitting: Internal Medicine

## 2020-11-12 ENCOUNTER — Encounter: Payer: Self-pay | Admitting: Internal Medicine

## 2020-11-12 ENCOUNTER — Other Ambulatory Visit: Payer: Self-pay

## 2020-11-12 VITALS — BP 110/70 | HR 88 | Temp 98.6°F | Resp 16 | Ht 66.0 in | Wt 161.4 lb

## 2020-11-12 DIAGNOSIS — Z86010 Personal history of colonic polyps: Secondary | ICD-10-CM

## 2020-11-12 DIAGNOSIS — G458 Other transient cerebral ischemic attacks and related syndromes: Secondary | ICD-10-CM | POA: Diagnosis not present

## 2020-11-12 DIAGNOSIS — I700 Atherosclerosis of aorta: Secondary | ICD-10-CM | POA: Diagnosis not present

## 2020-11-12 DIAGNOSIS — Z1211 Encounter for screening for malignant neoplasm of colon: Secondary | ICD-10-CM

## 2020-11-12 DIAGNOSIS — Z0000 Encounter for general adult medical examination without abnormal findings: Secondary | ICD-10-CM

## 2020-11-12 DIAGNOSIS — R739 Hyperglycemia, unspecified: Secondary | ICD-10-CM

## 2020-11-12 DIAGNOSIS — E7800 Pure hypercholesterolemia, unspecified: Secondary | ICD-10-CM | POA: Diagnosis not present

## 2020-11-12 DIAGNOSIS — I8393 Asymptomatic varicose veins of bilateral lower extremities: Secondary | ICD-10-CM | POA: Diagnosis not present

## 2020-11-12 DIAGNOSIS — I10 Essential (primary) hypertension: Secondary | ICD-10-CM

## 2020-11-12 NOTE — Assessment & Plan Note (Addendum)
The 10-year ASCVD risk score Mikey Bussing DC Brooke Bonito., et al., 2013) is: 14.3%   Values used to calculate the score:     Age: 75 years     Sex: Female     Is Non-Hispanic African American: No     Diabetic: No     Tobacco smoker: No     Systolic Blood Pressure: 161 mmHg     Is BP treated: Yes     HDL Cholesterol: 60.3 mg/dL     Total Cholesterol: 188 mg/dL  Discussed calculated cholesterol risk.  Intolerant to statin medication and zetia.  Discussed repatha.  She is interested.  CCM referral.

## 2020-11-12 NOTE — Patient Instructions (Signed)
Evolocumab injection What is this medicine? EVOLOCUMAB (e voe LOK ue mab) treats high cholesterol. It is used with lifestyle changes, like diet and exercise. It is used alone or with other medicines. This medicine may be used for other purposes; ask your health care provider or pharmacist if you have questions. COMMON BRAND NAME(S): Repatha What should I tell my health care provider before I take this medicine? They need to know if you have any of these conditions:  an unusual or allergic reaction to evolocumab, other medicines, latex, foods, dyes, or preservatives  pregnant or trying to get pregnant  breast-feeding How should I use this medicine? This medicine is injected under the skin. You will be taught how to prepare and give it. Take it as directed on the prescription label at the same time every day. Keep taking it unless your health care provider tells you to stop. It is important that you put your used needles and syringes in a special sharps container. Do not put them in a trash can. If you do not have a sharps container, call your pharmacist or health care provider to get one. This medicine comes with INSTRUCTIONS FOR USE. Ask your pharmacist for directions on how to use this medicine. Read the information carefully. Talk to your pharmacist or health care provider if you have questions. Talk to your health care provider about the use of this medicine in children. While it may be prescribed for children as young as 10 for selected conditions, precautions do apply. Overdosage: If you think you have taken too much of this medicine contact a poison control center or emergency room at once. NOTE: This medicine is only for you. Do not share this medicine with others. What if I miss a dose? It is important not to miss any doses. Talk to your health care provider about what to do if you miss a dose. What may interact with this medicine? Interactions are not expected. This list may not  describe all possible interactions. Give your health care provider a list of all the medicines, herbs, non-prescription drugs, or dietary supplements you use. Also tell them if you smoke, drink alcohol, or use illegal drugs. Some items may interact with your medicine. What should I watch for while using this medicine? Visit your health care provider for regular checks on your progress. Tell your health care provider if your symptoms do not start to get better or if they get worse. You may need blood work while you are taking this drug. Do not wear the on-body infuser during an MRI. Taking this drug is only part of a total heart healthy program. Your health care provider may give you a special diet to follow. Avoid alcohol. Avoid smoking. Ask your health care provider how much you should exercise. What side effects may I notice from receiving this medicine? Side effects that you should report to your doctor or health care provider as soon as possible:  allergic reactions (skin rash, itching or hives; swelling of the face, lips, or tongue)  high blood sugar (increased hunger, thirst, or urination; unusually weak or tired, blurry vision)  infection (fever, chills, cough, sore throat, pain, or trouble passing urine) Side effects that usually do not require medical attention (report to your doctor or health care provider if they continue or are bothersome):  diarrhea  muscle pain  nausea  pain, redness, or irritation at site where injected This list may not describe all possible side effects. Call your doctor for  medical advice about side effects. You may report side effects to FDA at 1-800-FDA-1088. Where should I keep my medicine? Keep out of the reach of children and pets. Store in a refrigerator or at room temperature between 20 and 25 degrees C (68 and 77 degrees F). Refrigeration (preferred): Store it in the refrigerator. Do not freeze. Keep it in the original carton until you are ready  to take it. Remove the dose from the carton about 30 minutes before it is time for you to take it. Throw away any unused medicine after the expiration date. Room temperature: This medicine may be stored at room temperature for up to 30 days. Keep it in the original carton until you are ready to take it. If it is stored at room temperature, throw away any unused medicine after 30 days or after it expires, whichever is first. Protect from light. Do not shake. Avoid exposure to extreme heat. To get rid of medicines that are no longer needed or have expired:  Take the medicine to a medicine take-back program. Check with your pharmacy or law enforcement to find a location.  If you cannot return the medicine, ask your pharmacist or health care provider how to get rid of this medicine safely. NOTE: This sheet is a summary. It may not cover all possible information. If you have questions about this medicine, talk to your doctor, pharmacist, or health care provider.  2021 Elsevier/Gold Standard (2019-06-04 12:26:14)

## 2020-11-12 NOTE — Assessment & Plan Note (Addendum)
Physical today 11/12/20.  Mammogram 05/14/20 - Birads I.  Colonoscopy 05/2013.  Recommended f/u in 5 years.  Overdue.  Agreeable for referral.

## 2020-11-12 NOTE — Progress Notes (Signed)
Patient ID: Whitney Carr, female   DOB: [birth year], 75 y.o.   MRN: [id number]   Subjective:    Patient ID: Whitney Carr, female    DOB: 1945-12-29, 75 y.o.   MRN: [id number]  HPI This visit occurred during the SARS-CoV-2 public health emergency.  Safety protocols were in place, including screening questions prior to the visit, additional usage of staff PPE, and extensive cleaning of exam room while observing appropriate contact time as indicated for disinfecting solutions.  Patient here for a complete physical exam.  She reports she is doing well.  Stays active.  No chest pain or sob with increased activity or exertion.  No acid reflux reported.  No abdominal pain.  Bowels moving.  Discussed labs.  Intolerant to statin medication and zetia.  Cholesterol improved on recent check.  Discussed repatha.   Past Medical History:  Diagnosis Date  . Degenerative joint disease   . GERD (gastroesophageal reflux disease)   . Hypercholesterolemia   . Hypertension    Past Surgical History:  Procedure Laterality Date  . APPENDECTOMY  1969  . TONSILECTOMY/ADENOIDECTOMY WITH MYRINGOTOMY  1970  . TUBAL LIGATION  1969  . VAGINAL HYSTERECTOMY  1987   secondary to bleeding   Family History  Problem Relation Age of Onset  . Congestive Heart Failure Mother   . Thyroid disease Mother   . Heart failure Father   . Diabetes Father   . Heart disease Sister        CABG  . Alzheimer's disease Sister   . Diabetes Sister   . Diabetes Sister   . Stroke Brother   . COPD Brother   . COPD Brother   . Colon cancer Brother   . Hypertension Brother   . Heart disease Brother   . Breast cancer Daughter   . Autoimmune disease Daughter    Social History   Socioeconomic History  . Marital status: Married    Spouse name: Not on file  . Number of children: 3  . Years of education: Not on file  . Highest education level: Not on file  Occupational History  . Not on file  Tobacco Use  . Smoking  status: Never Smoker  . Smokeless tobacco: Never Used  Vaping Use  . Vaping Use: Never used  Substance and Sexual Activity  . Alcohol use: No    Alcohol/week: 0.0 standard drinks  . Drug use: No  . Sexual activity: Yes  Other Topics Concern  . Not on file  Social History Narrative  . Not on file   Social Determinants of Health   Financial Resource Strain: Low Risk   . Difficulty of Paying Living Expenses: Not hard at all  Food Insecurity: No Food Insecurity  . Worried About Charity fundraiser in the Last Year: Never true  . Ran Out of Food in the Last Year: Never true  Transportation Needs: No Transportation Needs  . Lack of Transportation (Medical): No  . Lack of Transportation (Non-Medical): No  Physical Activity: Sufficiently Active  . Days of Exercise per Week: 5 days  . Minutes of Exercise per Session: 30 min  Stress: No Stress Concern Present  . Feeling of Stress : Not at all  Social Connections: Unknown  . Frequency of Communication with Friends and Family: Not on file  . Frequency of Social Gatherings with Friends and Family: Not on file  . Attends Religious Services: Not on file  . Active Member of Clubs or Organizations: Not  on file  . Attends Archivist Meetings: Not on file  . Marital Status: Married    Outpatient Encounter Medications as of 11/12/2020  Medication Sig  . amLODipine (NORVASC) 2.5 MG tablet Take 1 tablet (2.5 mg total) by mouth daily.  Marland Kitchen aspirin 81 MG tablet Take 81 mg by mouth daily.  Marland Kitchen azelastine (ASTELIN) 0.1 % nasal spray Place 1 spray into both nostrils 2 (two) times daily. Use in each nostril as directed  . Calcium 600-200 MG-UNIT per tablet Take 1 tablet by mouth 2 (two) times daily.  . cholecalciferol (VITAMIN D) 1000 UNITS tablet Take 1,000 Units by mouth daily.  Marland Kitchen losartan (COZAAR) 50 MG tablet TAKE (2) TABLETS BY MOUTH ONCE DAILY.   No facility-administered encounter medications on file as of 11/12/2020.    Review of  Systems  Constitutional: Negative for appetite change and unexpected weight change.  HENT: Negative for congestion, sinus pressure and sore throat.   Eyes: Negative for pain and visual disturbance.  Respiratory: Negative for cough, chest tightness and shortness of breath.   Cardiovascular: Negative for chest pain, palpitations and leg swelling.  Gastrointestinal: Negative for abdominal pain, diarrhea, nausea and vomiting.  Genitourinary: Negative for difficulty urinating and dysuria.  Musculoskeletal: Negative for joint swelling and myalgias.  Skin: Negative for color change and rash.  Neurological: Negative for dizziness, light-headedness and headaches.  Hematological: Negative for adenopathy. Does not bruise/bleed easily.  Psychiatric/Behavioral: Negative for agitation and dysphoric mood.       Objective:    Physical Exam Vitals reviewed.  Constitutional:      General: She is not in acute distress.    Appearance: Normal appearance. She is well-developed.  HENT:     Head: Normocephalic and atraumatic.     Right Ear: External ear normal.     Left Ear: External ear normal.  Eyes:     General: No scleral icterus.       Right eye: No discharge.        Left eye: No discharge.     Conjunctiva/sclera: Conjunctivae normal.  Neck:     Thyroid: No thyromegaly.  Cardiovascular:     Rate and Rhythm: Normal rate and regular rhythm.  Pulmonary:     Effort: No tachypnea, accessory muscle usage or respiratory distress.     Breath sounds: Normal breath sounds. No decreased breath sounds or wheezing.  Chest:  Breasts:     Right: No inverted nipple, mass, nipple discharge or tenderness (no axillary adenopathy).     Left: No inverted nipple, mass, nipple discharge or tenderness (no axilarry adenopathy).    Abdominal:     General: Bowel sounds are normal.     Palpations: Abdomen is soft.     Tenderness: There is no abdominal tenderness.  Musculoskeletal:        General: No swelling or  tenderness.     Cervical back: Neck supple. No tenderness.  Lymphadenopathy:     Cervical: No cervical adenopathy.  Skin:    Findings: No erythema or rash.  Neurological:     Mental Status: She is alert and oriented to person, place, and time.  Psychiatric:        Mood and Affect: Mood normal.        Behavior: Behavior normal.     BP 110/70   Pulse 88   Temp 98.6 F (37 C) (Oral)   Resp 16   Ht _0  (1.676 m)   Wt 161 lb 6.4 oz (73.2  kg)   LMP 06/03/1986   SpO2 98%   BMI 26.05 kg/m  Wt Readings from Last 3 Encounters:  11/12/20 161 lb 6.4 oz (73.2 kg)  07/02/20 158 lb (71.7 kg)  04/09/20 165 lb (74.8 kg)     Lab Results  Component Value Date   WBC 5.8 06/30/2020   HGB 14.2 06/30/2020   HCT 43.2 06/30/2020   PLT 299.0 06/30/2020   GLUCOSE 99 11/09/2020   CHOL 188 11/09/2020   TRIG 112.0 11/09/2020   HDL 60.30 11/09/2020   LDLDIRECT 143.6 05/13/2012   LDLCALC 105 (H) 11/09/2020   ALT 11 11/09/2020   AST 12 11/09/2020   NA 135 11/09/2020   K 4.1 11/09/2020   CL 100 11/09/2020   CREATININE 0.79 11/09/2020   BUN 29 (H) 11/09/2020   CO2 26 11/09/2020   TSH 2.05 02/18/2020   HGBA1C 5.8 11/09/2020    MM 3D SCREEN BREAST BILATERAL  Result Date: 05/14/2020 CLINICAL DATA:  Screening. EXAM: DIGITAL SCREENING BILATERAL MAMMOGRAM WITH TOMO AND CAD COMPARISON:  Previous exam(s). ACR Breast Density Category c: The breast tissue is heterogeneously dense, which may obscure small masses. FINDINGS: There are no findings suspicious for malignancy. Images were processed with CAD. IMPRESSION: No mammographic evidence of malignancy. A result letter of this screening mammogram will be mailed directly to the patient. RECOMMENDATION: Screening mammogram in one year. (Code:SM-B-01Y) BI-RADS CATEGORY  1: Negative. Electronically Signed   By: Kristopher Oppenheim M.D.   On: 05/14/2020 13:43       Assessment & Plan:   Problem List Items Addressed This Visit    Aortic atherosclerosis  ([place])    Intolerant to statin medication and zetia.  Discussed repatha.  She is interested.  CCM referral.       Health care maintenance    Physical today 11/12/20.  Mammogram 05/14/20 - Birads I.  Colonoscopy 05/2013.  Recommended f/u in 5 years.  Overdue.  Agreeable for referral.        History of colonic polyps    Colonoscopy 05/2013.  Recommended f/u in 5 years. Overdue.  Discussed.  Agreeable for referral.        Relevant Orders   Ambulatory referral to Gastroenterology   Hypercholesteremia    The 10-year ASCVD risk score Mikey Bussing DC Brooke Bonito., et al., 2013) is: 14.3%   Values used to calculate the score:     Age: 66 years     Sex: Female     Is Non-Hispanic African American: No     Diabetic: No     Tobacco smoker: No     Systolic Blood Pressure: 432 mmHg     Is BP treated: Yes     HDL Cholesterol: 60.3 mg/dL     Total Cholesterol: 188 mg/dL  Discussed calculated cholesterol risk.  Intolerant to statin medication and zetia.  Discussed repatha.  She is interested.  CCM referral.       Relevant Orders   AMB Referral to [facility]ordin[facility]   TSH   Lipid panel   Hepatic function panel   Hyperglycemia    Low carb diet and exercise.  Follow met b and a1c.   Lab Results  Component Value Date   HGBA1C 5.8 11/09/2020        Relevant Orders   Hemoglobin A1c   Hypertension    Continue amlodipine and losartan.  Blood pressure as outlined.  Follow pressures.  Follow metabolic panel.       Relevant Orders  Basic metabolic panel   Subclavian steal syndrome    S/p revascularization.  Continue risk factor modification.       Varicosities of leg    Request referral to [facility].  Discussed compression hose.        Relevant Orders   Ambulatory referral to Vascular Surgery    Other Visit Diagnoses    Colon cancer screening    -  Primary   Relevant Orders   Ambulatory referral to Gastroenterology       Einar Pheasant, MD

## 2020-11-13 ENCOUNTER — Encounter: Payer: Self-pay | Admitting: Internal Medicine

## 2020-11-13 DIAGNOSIS — I8390 Asymptomatic varicose veins of unspecified lower extremity: Secondary | ICD-10-CM | POA: Insufficient documentation

## 2020-11-13 NOTE — Assessment & Plan Note (Signed)
Low carb diet and exercise.  Follow met b and a1c.   Lab Results  Component Value Date   HGBA1C 5.8 11/09/2020

## 2020-11-13 NOTE — Assessment & Plan Note (Signed)
Intolerant to statin medication and zetia.  Discussed repatha.  She is interested.  CCM referral.

## 2020-11-13 NOTE — Assessment & Plan Note (Signed)
Colonoscopy 05/2013.  Recommended f/u in 5 years. Overdue.  Discussed.  Agreeable for referral.

## 2020-11-13 NOTE — Assessment & Plan Note (Signed)
Request referral to [facility].  Discussed compression hose.

## 2020-11-13 NOTE — Assessment & Plan Note (Signed)
S/p revascularization.  Continue risk factor modification.

## 2020-11-13 NOTE — Assessment & Plan Note (Signed)
Continue amlodipine and losartan.  Blood pressure as outlined.  Follow pressures.  Follow metabolic panel.  

## 2020-11-15 ENCOUNTER — Telehealth: Payer: Self-pay

## 2020-11-15 NOTE — Chronic Care Management (AMB) (Signed)
  Chronic Care Management   Note  11/15/2020 Name: Whitney Carr MRN: [id number] DOB: 02-03-1946  [name] is a 75 y.o. year old female who is a primary care patient of Einar Pheasant, MD. I reached out to [place] by phone today in response to a referral sent by [name] Lisa's PCP, Einar Pheasant, MD     [name] was given information about Chronic Care Management services today including:  1. CCM service includes personalized support from designated clinical staff supervised by her physician, including individualized plan of care and coordination with other care providers 2. 24/7 contact phone numbers for assistance for urgent and routine care needs. 3. Service will only be billed when office clinical staff spend 20 minutes or more in a month to coordinate care. 4. Only one practitioner may furnish and bill the service in a calendar month. 5. The patient may stop CCM services at any time (effective at the end of the month) by phone call to the office staff. 6. The patient will be responsible for cost sharing (co-pay) of up to 20% of the service fee (after annual deductible is met).  Patient did not agree to enrollment in care management services and does not wish to consider at this time.  Follow up plan: Patient declinesengagement by the care management team. Appropriate care team members and provider have been notified via electronic communication.   Noreene Larsson, [address], [place], [place] [address] Direct Dial: [phone] [email or web address] Website: [email or web address]

## 2020-11-23 DIAGNOSIS — G458 Other transient cerebral ischemic attacks and related syndromes: Secondary | ICD-10-CM | POA: Diagnosis not present

## 2020-11-23 DIAGNOSIS — R0989 Other specified symptoms and signs involving the circulatory and respiratory systems: Secondary | ICD-10-CM | POA: Diagnosis not present

## 2020-11-23 DIAGNOSIS — R072 Precordial pain: Secondary | ICD-10-CM | POA: Diagnosis not present

## 2020-11-23 DIAGNOSIS — I10 Essential (primary) hypertension: Secondary | ICD-10-CM | POA: Diagnosis not present

## 2020-11-24 ENCOUNTER — Telehealth (INDEPENDENT_AMBULATORY_CARE_PROVIDER_SITE_OTHER): Payer: Self-pay

## 2020-11-25 NOTE — Telephone Encounter (Signed)
Documentation

## 2021-01-24 DIAGNOSIS — M170 Bilateral primary osteoarthritis of knee: Secondary | ICD-10-CM | POA: Diagnosis not present

## 2021-01-28 ENCOUNTER — Other Ambulatory Visit: Payer: Self-pay

## 2021-01-28 ENCOUNTER — Encounter (INDEPENDENT_AMBULATORY_CARE_PROVIDER_SITE_OTHER): Payer: Self-pay | Admitting: Nurse Practitioner

## 2021-01-28 ENCOUNTER — Ambulatory Visit (INDEPENDENT_AMBULATORY_CARE_PROVIDER_SITE_OTHER): Payer: Medicare Other | Admitting: Nurse Practitioner

## 2021-01-28 VITALS — BP 187/82 | HR 98 | Resp 16 | Ht 65.5 in | Wt 161.8 lb

## 2021-01-28 DIAGNOSIS — I10 Essential (primary) hypertension: Secondary | ICD-10-CM

## 2021-01-28 DIAGNOSIS — G458 Other transient cerebral ischemic attacks and related syndromes: Secondary | ICD-10-CM | POA: Diagnosis not present

## 2021-01-28 DIAGNOSIS — I8393 Asymptomatic varicose veins of bilateral lower extremities: Secondary | ICD-10-CM | POA: Diagnosis not present

## 2021-02-04 ENCOUNTER — Other Ambulatory Visit (INDEPENDENT_AMBULATORY_CARE_PROVIDER_SITE_OTHER): Payer: Self-pay | Admitting: Nurse Practitioner

## 2021-02-04 DIAGNOSIS — I8393 Asymptomatic varicose veins of bilateral lower extremities: Secondary | ICD-10-CM

## 2021-02-06 ENCOUNTER — Encounter (INDEPENDENT_AMBULATORY_CARE_PROVIDER_SITE_OTHER): Payer: Self-pay | Admitting: Nurse Practitioner

## 2021-02-06 NOTE — Progress Notes (Signed)
Subjective:    Patient ID: Whitney Carr, female    DOB: 03-13-1946, 75 y.o.   MRN: [id number] Chief Complaint  Patient presents with   New Patient (Initial Visit)    Ref Nicki Reaper varicose veins of both lower extremities     Whitney Carr is a 75 year old female that is seen for evaluation of symptomatic varicose veins. The patient relates burning and stinging which worsened steadily throughout the course of the day, particularly with standing. The patient also notes an aching and throbbing pain over the varicosities, particularly with prolonged dependent positions. The symptoms are significantly improved with elevation.  The patient also notes that during hot weather the symptoms are greatly intensified. The patient states the pain from the varicose veins interferes with work, daily exercise, shopping and household maintenance. At this point, the symptoms are persistent and severe enough that they're having a negative impact on lifestyle and are interfering with daily activities.  There is no history of DVT, PE or superficial thrombophlebitis. There is no history of ulceration or hemorrhage. The patient denies a significant family history of varicose veins.   The patient has not worn graduated compression in the past. At the present time the patient has not been using over-the-counter analgesics. There is no history of prior surgical intervention or sclerotherapy.      Review of Systems  Cardiovascular:  Positive for leg swelling.  All other systems reviewed and are negative.     Objective:   Physical Exam Vitals reviewed.  HENT:     Head: Normocephalic.  Cardiovascular:     Rate and Rhythm: Normal rate.     Pulses: Normal pulses.  Pulmonary:     Effort: Pulmonary effort is normal.  Skin:    Comments: Bilateral varicosities  Neurological:     Mental Status: She is alert and oriented to person, place, and time.  Psychiatric:        Mood and Affect: Mood normal.         Behavior: Behavior normal.        Thought Content: Thought content normal.        Judgment: Judgment normal.    BP (!) 187/82 (BP Location: Right Arm)   Pulse 98   Resp 16   Ht 5' 5.5" (1.664 m)   Wt 161 lb 12.8 oz (73.4 kg)   LMP 06/03/1986   BMI 26.52 kg/m   Past Medical History:  Diagnosis Date   Degenerative joint disease    GERD (gastroesophageal reflux disease)    Hypercholesterolemia    Hypertension     Social History   Socioeconomic History   Marital status: Married    Spouse name: Not on file   Number of children: 3   Years of education: Not on file   Highest education level: Not on file  Occupational History   Not on file  Tobacco Use   Smoking status: Never   Smokeless tobacco: Never  Vaping Use   Vaping Use: Never used  Substance and Sexual Activity   Alcohol use: No    Alcohol/week: 0.0 standard drinks   Drug use: No   Sexual activity: Yes  Other Topics Concern   Not on file  Social History Narrative   Not on file   Social Determinants of Health   Financial Resource Strain: Low Risk    Difficulty of Paying Living Expenses: Not hard at all  Food Insecurity: No Food Insecurity   Worried About Charity fundraiser in  the Last Year: Never true   Ran Out of Food in the Last Year: Never true  Transportation Needs: No Transportation Needs   Lack of Transportation (Medical): No   Lack of Transportation (Non-Medical): No  Physical Activity: Sufficiently Active   Days of Exercise per Week: 5 days   Minutes of Exercise per Session: 30 min  Stress: No Stress Concern Present   Feeling of Stress : Not at all  Social Connections: Unknown   Frequency of Communication with Friends and Family: Not on file   Frequency of Social Gatherings with Friends and Family: Not on file   Attends Religious Services: Not on file   Active Member of Clubs or Organizations: Not on file   Attends Archivist Meetings: Not on file   Marital Status: Married   Intimate Partner Violence: Not At Risk   Fear of Current or Ex-Partner: No   Emotionally Abused: No   Physically Abused: No   Sexually Abused: No    Past Surgical History:  Procedure Laterality Date   APPENDECTOMY  1969   TONSILECTOMY/ADENOIDECTOMY [place]   secondary to bleeding    Family History  Problem Relation Age of Onset   Congestive Heart Failure Mother    Thyroid disease Mother    Heart failure Father    Diabetes Father    Heart disease Sister        CABG   Alzheimer's disease Sister    Diabetes Sister    Diabetes Sister    Stroke Brother    COPD Brother    COPD Brother    Colon cancer Brother    Hypertension Brother    Heart disease Brother    Breast cancer Daughter    Autoimmune disease Daughter     No Known Allergies  CBC Latest Ref Rng & Units 06/30/2020 06/03/2019 04/12/2018  WBC 4.0 - 10.5 K/uL 5.8 6.4 8.1  Hemoglobin 12.0 - 15.0 g/dL 14.2 14.0 14.4  Hematocrit 36.0 - 46.0 % 43.2 43.0 43.4  Platelets 150.0 - 400.0 K/uL 299.0 317.0 275.0      CMP     Component Value Date/Time   NA 135 11/09/2020 0750   NA 140 08/07/2013 1056   K 4.1 11/09/2020 0750   CL 100 11/09/2020 0750   CO2 26 11/09/2020 0750   GLUCOSE 99 11/09/2020 0750   BUN 29 (H) 11/09/2020 0750   BUN 13 08/07/2013 1056   BUN 18 07/02/2012 1009   CREATININE 0.79 11/09/2020 0750   CREATININE 0.61 07/02/2012 1009   CALCIUM 10.0 11/09/2020 0750   PROT 7.3 11/09/2020 0750   PROT 7.4 08/07/2013 1056   ALBUMIN 4.5 11/09/2020 0750   ALBUMIN 4.7 08/07/2013 1056   AST 12 11/09/2020 0750   ALT 11 11/09/2020 0750   ALKPHOS 55 11/09/2020 0750   BILITOT 0.7 11/09/2020 0750   GFRNONAA 66 08/07/2013 1056   GFRNONAA >60 07/02/2012 1009   GFRAA 77 08/07/2013 1056   GFRAA >60 07/02/2012 1009     No results found.     Assessment & Plan:   1. Varicose veins of both lower extremities, unspecified whether complicated   Recommend:  The patient has large symptomatic varicose veins that are painful and associated with swelling.  I have had a long discussion with the patient regarding  varicose veins and why they cause symptoms.  Patient will begin wearing graduated compression stockings class 1 on  a daily basis, beginning first thing in the morning and removing them in the evening. The patient is instructed specifically not to sleep in the stockings.    The patient  will also begin using over-the-counter analgesics such as Motrin 600 mg po TID to help control the symptoms.    In addition, behavioral modification including elevation during the day will be initiated.    Pending the results of these changes the  patient will be reevaluated in three months.   An  ultrasound of the venous system will be obtained.   Further plans will be based on the ultrasound results and whether conservative therapies are successful at eliminating the pain and swelling.    2. Primary hypertension Continue antihypertensive medications as already ordered, these medications have been reviewed and there are no changes at this time.   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  She has not had any issues since that time.    Current Outpatient Medications on File Prior to Visit  Medication Sig Dispense Refill   amLODipine (NORVASC) 2.5 MG tablet Take 1 tablet (2.5 mg total) by mouth daily. 90 tablet 3   aspirin 81 MG tablet Take 81 mg by mouth daily.     Calcium 600-200 MG-UNIT per tablet Take 1 tablet by mouth 2 (two) times daily.     cholecalciferol (VITAMIN D) 1000 UNITS tablet Take 1,000 Units by mouth daily.     losartan (COZAAR) 50 MG tablet TAKE (2) TABLETS BY MOUTH ONCE DAILY. 180 tablet 3   azelastine (ASTELIN) 0.1 % nasal spray Place 1 spray into both nostrils 2 (two) times daily. Use in each nostril as directed (Patient not taking: Reported on 01/28/2021) 30 mL 1    No current facility-administered medications on file prior to visit.    There are no Patient Instructions on file for this visit. No follow-ups on file.   Kris Hartmann, NP

## 2021-02-07 ENCOUNTER — Encounter (INDEPENDENT_AMBULATORY_CARE_PROVIDER_SITE_OTHER): Payer: Medicare Other

## 2021-02-07 ENCOUNTER — Ambulatory Visit (INDEPENDENT_AMBULATORY_CARE_PROVIDER_SITE_OTHER): Payer: Medicare Other | Admitting: Vascular Surgery

## 2021-02-21 ENCOUNTER — Other Ambulatory Visit: Payer: Self-pay

## 2021-02-21 ENCOUNTER — Ambulatory Visit (INDEPENDENT_AMBULATORY_CARE_PROVIDER_SITE_OTHER): Payer: Medicare Other | Admitting: Vascular Surgery

## 2021-02-21 ENCOUNTER — Encounter (INDEPENDENT_AMBULATORY_CARE_PROVIDER_SITE_OTHER): Payer: Self-pay

## 2021-02-21 ENCOUNTER — Ambulatory Visit (INDEPENDENT_AMBULATORY_CARE_PROVIDER_SITE_OTHER): Payer: Medicare Other

## 2021-02-21 DIAGNOSIS — I8393 Asymptomatic varicose veins of bilateral lower extremities: Secondary | ICD-10-CM | POA: Diagnosis not present

## 2021-03-02 DIAGNOSIS — K219 Gastro-esophageal reflux disease without esophagitis: Secondary | ICD-10-CM | POA: Diagnosis not present

## 2021-03-02 DIAGNOSIS — Z86010 Personal history of colonic polyps: Secondary | ICD-10-CM | POA: Diagnosis not present

## 2021-03-11 ENCOUNTER — Other Ambulatory Visit (INDEPENDENT_AMBULATORY_CARE_PROVIDER_SITE_OTHER): Payer: Medicare Other

## 2021-03-11 ENCOUNTER — Other Ambulatory Visit: Payer: Self-pay

## 2021-03-11 DIAGNOSIS — I10 Essential (primary) hypertension: Secondary | ICD-10-CM

## 2021-03-11 DIAGNOSIS — E7800 Pure hypercholesterolemia, unspecified: Secondary | ICD-10-CM

## 2021-03-11 DIAGNOSIS — R739 Hyperglycemia, unspecified: Secondary | ICD-10-CM | POA: Diagnosis not present

## 2021-03-11 LAB — BASIC METABOLIC PANEL
BUN: 14 mg/dL (ref 6–23)
CO2: 27 mEq/L (ref 19–32)
Calcium: 9.7 mg/dL (ref 8.4–10.5)
Chloride: 102 mEq/L (ref 96–112)
Creatinine, Ser: 0.85 mg/dL (ref 0.40–1.20)
GFR: 67.17 mL/min (ref >60.00)
Glucose, Bld: 92 mg/dL (ref 70–99)
Potassium: 4.1 mEq/L (ref 3.5–5.1)
Sodium: 137 mEq/L (ref 135–145)

## 2021-03-11 LAB — HEPATIC FUNCTION PANEL
ALT: 9 U/L (ref 0–35)
AST: 11 U/L (ref 0–37)
Albumin: 4.2 g/dL (ref 3.5–5.2)
Alkaline Phosphatase: 48 U/L (ref 39–117)
Bilirubin, Direct: 0.1 mg/dL (ref 0.0–0.3)
Total Bilirubin: 0.6 mg/dL (ref 0.2–1.2)
Total Protein: 6.7 g/dL (ref 6.0–8.3)

## 2021-03-11 LAB — LIPID PANEL
Cholesterol: 165 mg/dL (ref 0–200)
HDL: 61.8 mg/dL (ref >39.00)
LDL Cholesterol: 85 mg/dL (ref 0–99)
NonHDL: 103.13
Total CHOL/HDL Ratio: 3
Triglycerides: 90 mg/dL (ref 0.0–149.0)
VLDL: 18 mg/dL (ref 0.0–40.0)

## 2021-03-11 LAB — HEMOGLOBIN A1C: Hgb A1c MFr Bld: 6 % (ref 4.6–6.5)

## 2021-03-11 LAB — TSH: TSH: 2.41 u[IU]/mL (ref 0.35–5.50)

## 2021-03-15 ENCOUNTER — Ambulatory Visit (INDEPENDENT_AMBULATORY_CARE_PROVIDER_SITE_OTHER): Payer: Medicare Other | Admitting: Internal Medicine

## 2021-03-15 ENCOUNTER — Other Ambulatory Visit: Payer: Self-pay

## 2021-03-15 VITALS — BP 126/74 | HR 74 | Temp 97.9°F | Resp 16 | Ht 66.0 in | Wt 154.0 lb

## 2021-03-15 DIAGNOSIS — I700 Atherosclerosis of aorta: Secondary | ICD-10-CM | POA: Diagnosis not present

## 2021-03-15 DIAGNOSIS — I8393 Asymptomatic varicose veins of bilateral lower extremities: Secondary | ICD-10-CM

## 2021-03-15 DIAGNOSIS — R739 Hyperglycemia, unspecified: Secondary | ICD-10-CM | POA: Diagnosis not present

## 2021-03-15 DIAGNOSIS — G458 Other transient cerebral ischemic attacks and related syndromes: Secondary | ICD-10-CM | POA: Diagnosis not present

## 2021-03-15 DIAGNOSIS — Z23 Encounter for immunization: Secondary | ICD-10-CM | POA: Diagnosis not present

## 2021-03-15 DIAGNOSIS — E7800 Pure hypercholesterolemia, unspecified: Secondary | ICD-10-CM | POA: Diagnosis not present

## 2021-03-15 DIAGNOSIS — I10 Essential (primary) hypertension: Secondary | ICD-10-CM | POA: Diagnosis not present

## 2021-03-15 DIAGNOSIS — Z1231 Encounter for screening mammogram for malignant neoplasm of breast: Secondary | ICD-10-CM

## 2021-03-15 DIAGNOSIS — Z86010 Personal history of colonic polyps: Secondary | ICD-10-CM

## 2021-03-15 MED ORDER — EZETIMIBE 10 MG PO TABS: ORAL_TABLET | ORAL | 1 refills | Status: DC

## 2021-03-15 NOTE — Progress Notes (Signed)
Patient ID: Zenovia Justman Ellers, female   DOB: 1945-10-03, 75 y.o.   MRN: [id number]   Subjective:    Patient ID: Sue Fernicola Linarez, female    DOB: 1945/06/29, 75 y.o.   MRN: [id number]  This visit occurred during the SARS-CoV-2 public health emergency.  Safety protocols were in place, including screening questions prior to the visit, additional usage of staff PPE, and extensive cleaning of exam room while observing appropriate contact time as indicated for disinfecting solutions.   Patient here for scheduled follow up.   Chief Complaint  Patient presents with   Hyperlipidemia   Hyperglycemia   Hypertension   .   HPI Blood pressure has been doing well.  Stays active.  Has lost weight.  No nausea or vomiting now.  Eating.  No abdominal pain.  No chest pain or sob reported.  Bowels moving.  She saw [facility] recently.  Is elevating her legs.  Wears compression hose.  Also saw GI.  Planning for colonoscopy in November.  Has what appears to be a small sebaceous cyst - back.  She will contact Dr Phillip Heal to evaluate.    Past Medical History:  Diagnosis Date   Degenerative joint disease    GERD (gastroesophageal reflux disease)    Hypercholesterolemia    Hypertension    Past Surgical History:  Procedure Laterality Date   APPENDECTOMY  1969   TONSILECTOMY/ADENOIDECTOMY WITH MYRINGOTOMY  [place]   secondary to bleeding   Family History  Problem Relation Age of Onset   Congestive Heart Failure Mother    Thyroid disease Mother    Heart failure Father    Diabetes Father    Heart disease Sister        CABG   Alzheimer's disease Sister    Diabetes Sister    Diabetes Sister    Stroke Brother    COPD Brother    COPD Brother    Colon cancer Brother    Hypertension Brother    Heart disease Brother    Breast cancer Daughter    Autoimmune disease Daughter    Social History   Socioeconomic History   Marital status: Married    Spouse name:  Not on file   Number of children: 3   Years of education: Not on file   Highest education level: Not on file  Occupational History   Not on file  Tobacco Use   Smoking status: Never   Smokeless tobacco: Never  Vaping Use   Vaping Use: Never used  Substance and Sexual Activity   Alcohol use: No    Alcohol/week: 0.0 standard drinks   Drug use: No   Sexual activity: Yes  Other Topics Concern   Not on file  Social History Narrative   Not on file   Social Determinants of Health   Financial Resource Strain: Low Risk    Difficulty of Paying Living Expenses: Not hard at all  Food Insecurity: No Food Insecurity   Worried About Charity fundraiser in the Last Year: Never true   [place] in the Last Year: Never true  Transportation Needs: No Transportation Needs   Lack of Transportation (Medical): No   Lack of Transportation (Non-Medical): No  Physical Activity: Sufficiently Active   Days of Exercise per Week: 5 days   Minutes of Exercise per Session: 30 min  Stress: No Stress Concern Present   Feeling of Stress : Not  at all  Social Connections: Unknown   Frequency of Communication with Friends and Family: Not on file   Frequency of Social Gatherings with Friends and Family: Not on file   Attends Religious Services: Not on file   Active Member of Clubs or Organizations: Not on file   Attends Archivist Meetings: Not on file   Marital Status: Married     Review of Systems  Constitutional:  Negative for appetite change.       Has lost weight.  Eating.   HENT:  Negative for congestion and sinus pressure.   Respiratory:  Negative for cough, chest tightness and shortness of breath.   Cardiovascular:  Negative for chest pain and palpitations.  Gastrointestinal:  Negative for abdominal pain, diarrhea, nausea and vomiting.  Genitourinary:  Negative for difficulty urinating and dysuria.  Musculoskeletal:  Negative for joint swelling and myalgias.  Skin:  Negative  for color change and rash.  Neurological:  Negative for dizziness, light-headedness and headaches.  Psychiatric/Behavioral:  Negative for agitation and dysphoric mood.       Objective:     BP 126/74   Pulse 74   Temp 97.9 F (36.6 C)   Resp 16   Ht _0  (1.676 m)   Wt 154 lb (69.9 kg)   LMP 06/03/1986   SpO2 98%   BMI 24.86 kg/m  Wt Readings from Last 3 Encounters:  03/15/21 154 lb (69.9 kg)  01/28/21 161 lb 12.8 oz (73.4 kg)  11/12/20 161 lb 6.4 oz (73.2 kg)    Physical Exam Vitals reviewed.  Constitutional:      General: She is not in acute distress.    Appearance: Normal appearance.  HENT:     Head: Normocephalic and atraumatic.     Right Ear: External ear normal.     Left Ear: External ear normal.  Eyes:     General: No scleral icterus.       Right eye: No discharge.        Left eye: No discharge.     Conjunctiva/sclera: Conjunctivae normal.  Neck:     Thyroid: No thyromegaly.  Cardiovascular:     Rate and Rhythm: Normal rate and regular rhythm.  Pulmonary:     Effort: No respiratory distress.     Breath sounds: Normal breath sounds. No wheezing.  Abdominal:     General: Bowel sounds are normal.     Palpations: Abdomen is soft.     Tenderness: There is no abdominal tenderness.  Musculoskeletal:        General: No swelling or tenderness.     Cervical back: Neck supple. No tenderness.  Lymphadenopathy:     Cervical: No cervical adenopathy.  Skin:    Findings: No erythema or rash.  Neurological:     Mental Status: She is alert.  Psychiatric:        Mood and Affect: Mood normal.        Behavior: Behavior normal.     Outpatient Encounter Medications as of 03/15/2021  Medication Sig   ezetimibe (ZETIA) 10 MG tablet Take q Monday, Wednesday and Friday.   amLODipine (NORVASC) 2.5 MG tablet Take 1 tablet (2.5 mg total) by mouth daily.   Calcium 600-200 MG-UNIT per tablet Take 1 tablet by mouth 2 (two) times daily.   cholecalciferol (VITAMIN D) 1000  UNITS tablet Take 1,000 Units by mouth daily.   losartan (COZAAR) 50 MG tablet TAKE (2) TABLETS BY MOUTH ONCE DAILY.   [DISCONTINUED] aspirin  81 MG tablet Take 81 mg by mouth daily.   [DISCONTINUED] azelastine (ASTELIN) 0.1 % nasal spray Place 1 spray into both nostrils 2 (two) times daily. Use in each nostril as directed (Patient not taking: Reported on 01/28/2021)   No facility-administered encounter medications on file as of 03/15/2021.     Lab Results  Component Value Date   WBC 5.8 06/30/2020   HGB 14.2 06/30/2020   HCT 43.2 06/30/2020   PLT 299.0 06/30/2020   GLUCOSE 92 03/11/2021   CHOL 165 03/11/2021   TRIG 90.0 03/11/2021   HDL 61.80 03/11/2021   LDLDIRECT 143.6 05/13/2012   LDLCALC 85 03/11/2021   ALT 9 03/11/2021   AST 11 03/11/2021   NA 137 03/11/2021   K 4.1 03/11/2021   CL 102 03/11/2021   CREATININE 0.85 03/11/2021   BUN 14 03/11/2021   CO2 27 03/11/2021   TSH 2.41 03/11/2021   HGBA1C 6.0 03/11/2021    MM 3D SCREEN BREAST BILATERAL  Result Date: 05/14/2020 CLINICAL DATA:  Screening. EXAM: DIGITAL SCREENING BILATERAL MAMMOGRAM WITH TOMO AND CAD COMPARISON:  Previous exam(s). ACR Breast Density Category c: The breast tissue is heterogeneously dense, which may obscure small masses. FINDINGS: There are no findings suspicious for malignancy. Images were processed with CAD. IMPRESSION: No mammographic evidence of malignancy. A result letter of this screening mammogram will be mailed directly to the patient. RECOMMENDATION: Screening mammogram in one year. (Code:SM-B-01Y) BI-RADS CATEGORY  1: Negative. Electronically Signed   By: Kristopher Oppenheim M.D.   On: 05/14/2020 13:43       Assessment & Plan:   Problem List Items Addressed This Visit     Aortic atherosclerosis ([place])    intolerant to statin medication and zetia.  Discussed repatha.  Confirm if agreeable, will place CCM referral.       Relevant Medications   ezetimibe (ZETIA) 10 MG tablet   History of colonic  polyps    Referred to GI last visit.  Planning colonoscopy in 04/2021.       Hypercholesteremia - Primary    The 10-year ASCVD risk score (Arnett DK, et al., 2019) is: 20.2%   Values used to calculate the score:     Age: 79 years     Sex: Female     Is Non-Hispanic African American: No     Diabetic: No     Tobacco smoker: No     Systolic Blood Pressure: 751 mmHg     Is BP treated: Yes     HDL Cholesterol: 61.8 mg/dL     Total Cholesterol: 165 mg/dL  Intolerant to statin medication.  Discussed other treatment options.  If interested will pursue CCM referral for repatha, etc.       Relevant Medications   ezetimibe (ZETIA) 10 MG tablet   Other Relevant Orders   Lipid panel   Hepatic function panel   Basic metabolic panel   CBC with Differential/Platelet   Hyperglycemia    Low carb diet and exercise.  Follow met b and a1c.   Lab Results  Component Value Date   HGBA1C 6.0 03/11/2021       Relevant Orders   Hemoglobin A1c   Hypertension    Continue amlodipine and losartan.  Blood pressure as outlined.  Follow pressures.  Follow metabolic panel.       Relevant Medications   ezetimibe (ZETIA) 10 MG tablet   Subclavian steal syndrome    S/p revascularization.  Continue risk factor modification.  Relevant Medications   ezetimibe (ZETIA) 10 MG tablet   Varicosities of leg    Compression hose.       Relevant Medications   ezetimibe (ZETIA) 10 MG tablet   Other Visit Diagnoses     Encounter for mammogram to establish baseline mammogram       Visit for screening mammogram       Relevant Orders   MM 3D SCREEN BREAST BILATERAL   Need for immunization against influenza       Relevant Orders   Flu Vaccine QUAD High Dose(Fluad) (Completed)        Einar Pheasant, MD

## 2021-03-17 DIAGNOSIS — L729 Follicular cyst of the skin and subcutaneous tissue, unspecified: Secondary | ICD-10-CM | POA: Diagnosis not present

## 2021-03-20 ENCOUNTER — Telehealth: Payer: Self-pay | Admitting: Internal Medicine

## 2021-03-20 ENCOUNTER — Encounter: Payer: Self-pay | Admitting: Internal Medicine

## 2021-03-20 NOTE — Telephone Encounter (Signed)
Intolerant to statin medication.  Have discussed with her previously possible repatha.  If agreeable, can place ccm referral.

## 2021-03-20 NOTE — Assessment & Plan Note (Signed)
Low carb diet and exercise.  Follow met b and a1c.   Lab Results  Component Value Date   HGBA1C 6.0 03/11/2021

## 2021-03-20 NOTE — Assessment & Plan Note (Signed)
Compression hose.   

## 2021-03-20 NOTE — Assessment & Plan Note (Signed)
intolerant to statin medication and zetia.  Discussed repatha.  Confirm if agreeable, will place CCM referral.

## 2021-03-20 NOTE — Assessment & Plan Note (Addendum)
Referred to GI last visit.  Planning colonoscopy in 04/2021.

## 2021-03-20 NOTE — Assessment & Plan Note (Signed)
The 10-year ASCVD risk score (Arnett DK, et al., 2019) is: 20.2%   Values used to calculate the score:     Age: 75 years     Sex: Female     Is Non-Hispanic African American: No     Diabetic: No     Tobacco smoker: No     Systolic Blood Pressure: 500 mmHg     Is BP treated: Yes     HDL Cholesterol: 61.8 mg/dL     Total Cholesterol: 165 mg/dL  Intolerant to statin medication.  Discussed other treatment options.  If interested will pursue CCM referral for repatha, etc.

## 2021-03-20 NOTE — Assessment & Plan Note (Signed)
S/p revascularization.  Continue risk factor modification.

## 2021-03-20 NOTE — Assessment & Plan Note (Signed)
Continue amlodipine and losartan.  Blood pressure as outlined.  Follow pressures.  Follow metabolic panel.  

## 2021-03-21 NOTE — Telephone Encounter (Signed)
Patient does not want to do repatha. Declined ccm referral

## 2021-04-12 ENCOUNTER — Ambulatory Visit (INDEPENDENT_AMBULATORY_CARE_PROVIDER_SITE_OTHER): Payer: Medicare Other

## 2021-04-12 VITALS — Ht 66.0 in | Wt 154.0 lb

## 2021-04-12 DIAGNOSIS — Z0000 Encounter for general adult medical examination without abnormal findings: Secondary | ICD-10-CM

## 2021-04-12 NOTE — Progress Notes (Signed)
Subjective:   Whitney Carr is a 75 y.o. female who presents for Medicare Annual (Subsequent) preventive examination.  Review of Systems    No ROS.  Medicare Wellness Virtual Visit.  Visual/audio telehealth visit, UTA vital signs.   See social history for additional risk factors.   Cardiac Risk Factors include: advanced age (>79men, >48 women);hypertension     Objective:    Today's Vitals   04/12/21 0907  Weight: 154 lb (69.9 kg)  Height: 5\' 6"  (1.676 m)   Body mass index is 24.86 kg/m.  Advanced Directives 04/12/2021 04/09/2020 04/09/2019 04/03/2018 04/02/2017 07/04/2016 07/04/2016  Does Patient Have a Medical Advance Directive? Yes Yes Yes Yes Yes Yes Yes;No  Type of Paramedic of [place];Living will Living will;Healthcare Power of [place];Living will [place];Living will Living will;Healthcare Power of Attorney Living will;Healthcare Power of Attorney -  Does patient want to make changes to medical advance directive? No - Patient declined - No - Patient declined No - Patient declined No - Patient declined - -  Copy of [place] in Chart? No - copy requested No - copy requested No - copy requested No - copy requested No - copy requested No - copy requested -    Current Medications (verified) Outpatient Encounter Medications as of 04/12/2021  Medication Sig   amLODipine (NORVASC) 2.5 MG tablet Take 1 tablet (2.5 mg total) by mouth daily.   Calcium 600-200 MG-UNIT per tablet Take 1 tablet by mouth 2 (two) times daily.   cholecalciferol (VITAMIN D) 1000 UNITS tablet Take 1,000 Units by mouth daily.   ezetimibe (ZETIA) 10 MG tablet Take q Monday, Wednesday and Friday.   losartan (COZAAR) 50 MG tablet TAKE (2) TABLETS BY MOUTH ONCE DAILY.   No facility-administered encounter medications on file as of 04/12/2021.    Allergies (verified) Patient has no known allergies.    History: Past Medical History:  Diagnosis Date   Degenerative joint disease    GERD (gastroesophageal reflux disease)    Hypercholesterolemia    Hypertension    Past Surgical History:  Procedure Laterality Date   APPENDECTOMY  1969   TONSILECTOMY/ADENOIDECTOMY WITH MYRINGOTOMY  [place]   secondary to bleeding   Family History  Problem Relation Age of Onset   Congestive Heart Failure Mother    Thyroid disease Mother    Heart failure Father    Diabetes Father    Heart disease Sister        CABG   Alzheimer's disease Sister    Diabetes Sister    Diabetes Sister    Stroke Brother    COPD Brother    COPD Brother    Colon cancer Brother    Hypertension Brother    Heart disease Brother    Breast cancer Daughter    Autoimmune disease Daughter    Social History   Socioeconomic History   Marital status: Married    Spouse name: Not on file   Number of children: 3   Years of education: Not on file   Highest education level: Not on file  Occupational History   Not on file  Tobacco Use   Smoking status: Never   Smokeless tobacco: Never  Vaping Use   Vaping Use: Never used  Substance and Sexual Activity   Alcohol use: No    Alcohol/week: 0.0 standard drinks   Drug use: No  Sexual activity: Yes  Other Topics Concern   Not on file  Social History Narrative   Not on file   Social Determinants of Health   Financial Resource Strain: Low Risk    Difficulty of Paying Living Expenses: Not hard at all  Food Insecurity: No Food Insecurity   Worried About [place] in the Last Year: Never true   [place] in the Last Year: Never true  Transportation Needs: No Transportation Needs   Lack of Transportation (Medical): No   Lack of Transportation (Non-Medical): No  Physical Activity: Sufficiently Active   Days of Exercise per Week: 5 days   Minutes of Exercise per Session: 30 min  Stress: No Stress  Concern Present   Feeling of Stress : Not at all  Social Connections: Unknown   Frequency of Communication with Friends and Family: Not on file   Frequency of Social Gatherings with Friends and Family: Not on file   Attends Religious Services: Not on Electrical engineer or Organizations: Not on file   Attends Archivist Meetings: Not on file   Marital Status: Married    Tobacco Counseling Counseling given: Not Answered   Clinical Intake:  Pre-visit preparation completed: Yes        Diabetes: No  How often do you need to have someone help you when you read instructions, pamphlets, or other written materials from your doctor or pharmacy?: 1 - Never  Interpreter Needed?: No      Activities of Daily Living In your present state of health, do you have any difficulty performing the following activities: 04/12/2021  Hearing? N  Vision? N  Difficulty concentrating or making decisions? N  Walking or climbing stairs? N  Comment Chronic knee pain, bilateral. Injections received [facility], prn.  Dressing or bathing? N  Doing errands, shopping? N  Preparing Food and eating ? N  Using the Toilet? N  In the past six months, have you accidently leaked urine? N  Do you have problems with loss of bowel control? N  Managing your Medications? N  Managing your Finances? N  Housekeeping or managing your Housekeeping? N  Some recent data might be hidden    Patient Care Team: Einar Pheasant, MD as PCP - General (Internal Medicine)  Indicate any recent Medical Services you may have received from other than [facility] providers in the past year (date may be approximate).     Assessment:   This is a routine wellness examination for [facility].  I connected with Whitney Carr today by telephone and verified that I am speaking with the correct person using two identifiers. Location patient: home Location provider: work Persons participating in the virtual visit: patient, Marine scientist.     I discussed the limitations, risks, security and privacy concerns of performing an evaluation and management service by telephone and the availability of in person appointments. The patient expressed understanding and verbally consented to this telephonic visit.    Interactive audio and video telecommunications were attempted between this provider and patient, however failed, due to patient having technical difficulties OR patient did not have access to video capability.  We continued and completed visit with audio only.  Some vital signs may be absent or patient reported.   Hearing/Vision screen Hearing Screening - Comments:: Patient is able to hear conversational tones without difficulty. No issues reported Vision Screening - Comments:: Followed by [facility]  Wears corrective lenses when reading Cataract extraction, R eye  only  They have regular follow up with the ophthalmologist  Dietary issues and exercise activities discussed: Current Exercise Habits: Home exercise routine, Type of exercise: calisthenics (elliptical), Time (Minutes): 30, Frequency (Times/Week): 5, Weekly Exercise (Minutes/Week): 150, Intensity: Mild Low carb diet Good water intake; minimum of 3 bottles daily   Goals Addressed               This Visit's Progress     Patient Stated     Maintain Healthy Lifestyle (pt-stated)        I would like to maintain current weight       Depression Screen PHQ 2/9 Scores 04/12/2021 04/09/2020 04/09/2019 04/03/2018 04/02/2017 07/04/2016 03/01/2016  PHQ - 2 Score 0 0 0 0 0 0 0    Fall Risk Fall Risk  04/12/2021 04/09/2020 04/09/2019 04/03/2018 04/02/2017  Falls in the past year? 0 0 0 No No  Number falls in past yr: 0 0 - - -  Injury with Fall? 0 - - - -  Follow up Falls evaluation completed Falls evaluation completed - - -    FALL RISK PREVENTION PERTAINING TO THE HOME: Adequate lighting in your home to reduce risk of falls? Yes   ASSISTIVE DEVICES UTILIZED TO  PREVENT FALLS: Life alert? No  Use of a cane, walker or w/c? No   TIMED UP AND GO: Was the test performed? No . Virtual visit.  Cognitive Function: Patient alert and oriented x3.  MMSE - Mini Mental State Exam 04/03/2018 04/02/2017  Orientation to time 5 5  Orientation to Place 5 5  Registration 3 3  Attention/ Calculation 5 5  Recall 3 3  Language- name 2 objects 2 2  Language- repeat 1 1  Language- follow 3 step command 3 3  Language- read & follow direction 1 1  Write a sentence 1 1  Copy design 1 1  Total score 30 30     6CIT Screen 04/12/2021 04/09/2020 04/09/2019  What Year? 0 points 0 points 0 points  What month? 0 points 0 points 0 points  What time? 0 points 0 points 0 points  Count back from 20 0 points - 0 points  Months in reverse 0 points - 0 points  Repeat phrase 0 points - 0 points  Total Score 0 - 0    Immunizations Immunization History  Administered Date(s) Administered   Fluad Quad(high Dose 65+) 04/10/2019, 03/15/2021   Influenza Split 05/06/2012   Influenza, High Dose Seasonal PF 03/23/2015, 03/01/2016, 04/02/2017, 03/01/2018   Influenza,inj,Quad PF,6+ Mos 03/09/2014   Influenza-Unspecified 02/29/2016   Moderna Sars-Covid-2 Vaccination 07/28/2019, 08/26/2019, 04/24/2020   Pneumococcal Conjugate-13 01/20/2014   Pneumococcal Polysaccharide-23 05/08/2012   Tdap 01/31/2017   Zoster Recombinat (Shingrix) 01/31/2017, 05/15/2017   Covid vaccine- 3 completed.  Health Maintenance Health Maintenance  Topic Date Due   COVID-19 Vaccine (4 - Booster for Moderna series) 04/28/2021 (Originally 08/23/2020)   MAMMOGRAM  05/11/2021   COLONOSCOPY (Pts 45-12yrs Insurance coverage will need to be confirmed)  06/18/2023   TETANUS/TDAP  02/01/2027   INFLUENZA VACCINE  Completed   DEXA SCAN  Completed   Hepatitis C Screening  Completed   Zoster Vaccines- Shingrix  Completed   HPV VACCINES  Aged Out   Colonoscopy- scheduled 04/28/21.   Lung Cancer Screening:  (Low Dose CT Chest recommended if Age 32-80 years, 30 pack-year currently smoking OR have quit w/in 15years.) does not qualify.   Vision Screening: Recommended annual ophthalmology exams for early detection of glaucoma  and other disorders of the eye.  Dental Screening: Recommended annual dental exams for proper oral hygiene  Community Resource Referral / Chronic Care Management: CRR required this visit?  No   CCM required this visit?  No      Plan:   Keep all routine maintenance appointments.   I have personally reviewed and noted the following in the patient's chart:   Medical and social history Use of alcohol, tobacco or illicit drugs  Current medications and supplements including opioid prescriptions. Not taking opioid. Functional ability and status Nutritional status Physical activity Advanced directives List of other physicians Hospitalizations, surgeries, and ER visits in previous 12 months Vitals Screenings to include cognitive, depression, and falls Referrals and appointments  In addition, I have reviewed and discussed with patient certain preventive protocols, quality metrics, and best practice recommendations. A written personalized care plan for preventive services as well as general preventive health recommendations were provided to patient.     Varney Biles, LPN   [id number]

## 2021-04-12 NOTE — Patient Instructions (Addendum)
  Ms. Whitney Carr , Thank you for taking time to come for your Medicare Wellness Visit. I appreciate your ongoing commitment to your health goals. Please review the following plan we discussed and let me know if I can assist you in the future.   These are the goals we discussed:  Goals       Patient Stated     Maintain Healthy Lifestyle (pt-stated)      I would like to maintain current weight      Other     Follow up with Primary Care Provider      As needed        This is a list of the screening recommended for you and due dates:  Health Maintenance  Topic Date Due   COVID-19 Vaccine (4 - Booster for Moderna series) 04/28/2021*   Mammogram  05/11/2021   Colon Cancer Screening  06/18/2023   Tetanus Vaccine  02/01/2027   Flu Shot  Completed   DEXA scan (bone density measurement)  Completed   Hepatitis C Screening: USPSTF Recommendation to screen - Ages 18-79 yo.  Completed   Zoster (Shingles) Vaccine  Completed   HPV Vaccine  Aged Out  *Topic was postponed. The date shown is not the original due date.

## 2021-04-19 ENCOUNTER — Other Ambulatory Visit: Payer: Self-pay

## 2021-04-19 ENCOUNTER — Encounter: Payer: Self-pay | Admitting: Emergency Medicine

## 2021-04-19 ENCOUNTER — Ambulatory Visit
Admission: EM | Admit: 2021-04-19 | Discharge: 2021-04-19 | Disposition: A | Discharge status: Home / Self Care | Payer: Medicare Other | Attending: Physician Assistant | Admitting: Physician Assistant

## 2021-04-19 DIAGNOSIS — R0981 Nasal congestion: Secondary | ICD-10-CM | POA: Diagnosis present

## 2021-04-19 DIAGNOSIS — Z79899 Other long term (current) drug therapy: Secondary | ICD-10-CM | POA: Diagnosis not present

## 2021-04-19 DIAGNOSIS — U071 COVID-19: Secondary | ICD-10-CM | POA: Diagnosis not present

## 2021-04-19 DIAGNOSIS — R519 Headache, unspecified: Secondary | ICD-10-CM | POA: Diagnosis present

## 2021-04-19 DIAGNOSIS — B349 Viral infection, unspecified: Secondary | ICD-10-CM | POA: Diagnosis not present

## 2021-04-19 LAB — SARS CORONAVIRUS 2 (TAT 6-24 HRS): SARS Coronavirus 2: POSITIVE — AB

## 2021-04-19 MED ORDER — ACETAMINOPHEN 500 MG PO TABS
1000.0000 mg | ORAL_TABLET | Freq: Once | ORAL | Status: AC
  Administered 2021-04-19: 1000 mg via ORAL

## 2021-04-19 NOTE — ED Triage Notes (Signed)
Pt presents today with c/o nasal congestion, sinus pressure/pain and nausea that began yesterday. +low grade temp 99.0. No meds taken pta. Requests Covid testing.

## 2021-04-19 NOTE — ED Provider Notes (Signed)
[facility]    CSN: [id number] Arrival date & time: 04/19/21  8099      History   Chief Complaint Chief Complaint  Patient presents with   Nasal Congestion   Facial Pain   Nausea    HPI Whitney Carr is a 75 y.o. female.   75 year old female pt, Whitney Carr, presents to ER with chief complaint of nasal congestion sinus pressure pain nausea x2 days patient states she slept all day yesterday has had intermittent fever.  No meds taken prior to arrival.  Request COVID testing.  No known illness exposure. Husband had sniffles, but is well now.  The history is provided by the patient. No language interpreter was used.   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   Aching 04/15/2018   Nausea 04/12/2018   Aortic atherosclerosis ([place]) 10/30/2017   Nasal congestion 07/22/2017   Light headedness 03/23/2017   Health care maintenance 07/09/2016   Neck pain 11/23/2015   Cough 11/02/2015   SOB (shortness of breath) on exertion 10/26/2015   Chest pain 10/26/2015   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      Home Medications    Prior to  Admission medications   Medication Sig Start Date End Date Taking? Authorizing Provider  amLODipine (NORVASC) 2.5 MG tablet Take 1 tablet (2.5 mg total) by mouth daily. 07/02/20   Einar Pheasant, MD  Calcium 600-200 MG-UNIT per tablet Take 1 tablet by mouth 2 (two) times daily.    [provider]  cholecalciferol (VITAMIN D) 1000 UNITS tablet Take 1,000 Units by mouth daily.    [provider]  ezetimibe (ZETIA) 10 MG tablet Take q Monday, Wednesday and Friday. 03/15/21   Einar Pheasant, MD  losartan (COZAAR) 50 MG tablet TAKE (2) TABLETS BY MOUTH ONCE DAILY. 07/02/20   Einar Pheasant, MD    Family History Family History  Problem Relation Age of Onset   Congestive Heart Failure Mother    Thyroid disease Mother    Heart failure Father    Diabetes Father    Heart disease Sister        CABG   Alzheimer's disease Sister    Diabetes Sister    Diabetes Sister    Stroke Brother    COPD Brother    COPD Brother    Colon cancer Brother    Hypertension Brother    Heart disease Brother    Breast cancer Daughter    Autoimmune disease Daughter     Social History Social History   Tobacco Use   Smoking status: Never   Smokeless tobacco: Never  Vaping Use  Vaping Use: Never used  Substance Use Topics   Alcohol use: No    Alcohol/week: 0.0 standard drinks   Drug use: No     Allergies   Patient has no known allergies.   Review of Systems Review of Systems  Constitutional:  Positive for activity change, fatigue and fever.  HENT:  Positive for congestion, sinus pressure and sinus pain.   Gastrointestinal:  Positive for nausea.  All other systems reviewed and are negative.   Physical Exam Triage Vital Signs ED Triage Vitals  Enc Vitals Group     BP 04/19/21 1020 133/61     Pulse Rate 04/19/21 1020 (!) 102     Resp 04/19/21 1020 16     Temp 04/19/21 1020 98.5 F (36.9 C)     Temp Source 04/19/21 1020 Oral     SpO2 04/19/21 1020 97 %     Weight --       Height --      Head Circumference --      Peak Flow --      Pain Score 04/19/21 1015 0     Pain Loc --      Pain Edu? --      Excl. in [place]? --    No data found.  Updated Vital Signs BP 133/61 (BP Location: Right Arm)   Pulse (!) 102   Temp 98.5 F (36.9 C) (Oral)   Resp 16   LMP 06/03/1986   SpO2 97%   Visual Acuity Right Eye Distance:   Left Eye Distance:   Bilateral Distance:    Right Eye Near:   Left Eye Near:    Bilateral Near:     Physical Exam Vitals and nursing note reviewed.  Constitutional:      General: She is not in acute distress.    Appearance: She is well-developed and well-groomed.  HENT:     Head: Normocephalic.     Right Ear: Tympanic membrane is retracted.     Left Ear: Tympanic membrane is retracted.     Nose: Congestion present.     Mouth/Throat:     Lips: Pink.     Mouth: Mucous membranes are moist.     Pharynx: Oropharynx is clear.  Eyes:     General: Lids are normal.     Extraocular Movements: Extraocular movements intact.     Conjunctiva/sclera: Conjunctivae normal.     Pupils: Pupils are equal, round, and reactive to light.  Neck:     Trachea: No tracheal deviation.  Cardiovascular:     Rate and Rhythm: Normal rate and regular rhythm.     Pulses: Normal pulses.     Heart sounds: Normal heart sounds. No murmur heard. Pulmonary:     Effort: Pulmonary effort is normal.     Breath sounds: Normal breath sounds and air entry.  Abdominal:     General: Bowel sounds are normal.     Palpations: Abdomen is soft.     Tenderness: There is no abdominal tenderness.  Musculoskeletal:        General: Normal range of motion.     Cervical back: Normal range of motion.  Lymphadenopathy:     Cervical: No cervical adenopathy.  Skin:    General: Skin is warm and dry.     Findings: No rash.  Neurological:     General: No focal deficit present.     Mental Status: She is alert and oriented to person, place, and time.  GCS: GCS eye subscore is 4.  GCS verbal subscore is 5. GCS motor subscore is 6.  Psychiatric:        Attention and Perception: Attention normal.        Speech: Speech normal.        Behavior: Behavior normal. Behavior is cooperative.     UC Treatments / Results  Labs (all labs ordered are listed, but only abnormal results are displayed) Labs Reviewed  SARS CORONAVIRUS 2 (TAT 6-24 HRS)    EKG   Radiology No results found.  Procedures Procedures (including critical care time)  Medications Ordered in UC Medications  acetaminophen (TYLENOL) tablet 1,000 mg (1,000 mg Oral Given 04/19/21 1131)    Initial Impression / Assessment and Plan / UC Course  I have reviewed the triage vital signs and the nursing notes.  Pertinent labs & imaging results that were available during my care of the patient were reviewed by me and considered in my medical decision making (see chart for details).     Ddx: Viral illness, COVID, Flu Final Clinical Impressions(s) / UC Diagnoses   Final diagnoses:  Nonspecific syndrome suggestive of viral illness     Discharge Instructions      You have been covid tested, please follow up with PCP. Quarantine until results known. Take OTC meds for symptom management.      ED Prescriptions   None    PDMP not reviewed this encounter.   Tori Milks, NP 29/93/71 1204

## 2021-04-19 NOTE — Discharge Instructions (Signed)
You have been covid tested, please follow up with PCP. Quarantine until results known. Take OTC meds for symptom management.

## 2021-04-28 ENCOUNTER — Encounter: Payer: Self-pay | Admitting: Gastroenterology

## 2021-04-28 ENCOUNTER — Ambulatory Visit
Admission: RE | Admit: 2021-04-28 | Discharge: 2021-04-28 | Disposition: A | Discharge status: Home / Self Care | Payer: Medicare Other | Attending: Gastroenterology | Admitting: Gastroenterology

## 2021-04-28 ENCOUNTER — Encounter
Admission: RE | Disposition: A | Discharge status: Home / Self Care | Payer: Self-pay | Source: Home / Self Care | Attending: Gastroenterology

## 2021-04-28 ENCOUNTER — Ambulatory Visit: Payer: Medicare Other | Admitting: Anesthesiology

## 2021-04-28 DIAGNOSIS — K640 First degree hemorrhoids: Secondary | ICD-10-CM | POA: Insufficient documentation

## 2021-04-28 DIAGNOSIS — D123 Benign neoplasm of transverse colon: Secondary | ICD-10-CM | POA: Diagnosis not present

## 2021-04-28 DIAGNOSIS — E7800 Pure hypercholesterolemia, unspecified: Secondary | ICD-10-CM | POA: Diagnosis not present

## 2021-04-28 DIAGNOSIS — Z1211 Encounter for screening for malignant neoplasm of colon: Secondary | ICD-10-CM | POA: Insufficient documentation

## 2021-04-28 DIAGNOSIS — K219 Gastro-esophageal reflux disease without esophagitis: Secondary | ICD-10-CM | POA: Diagnosis not present

## 2021-04-28 DIAGNOSIS — Z86010 Personal history of colonic polyps: Secondary | ICD-10-CM | POA: Diagnosis not present

## 2021-04-28 DIAGNOSIS — D124 Benign neoplasm of descending colon: Secondary | ICD-10-CM | POA: Diagnosis not present

## 2021-04-28 DIAGNOSIS — I10 Essential (primary) hypertension: Secondary | ICD-10-CM | POA: Insufficient documentation

## 2021-04-28 DIAGNOSIS — D120 Benign neoplasm of cecum: Secondary | ICD-10-CM | POA: Diagnosis not present

## 2021-04-28 DIAGNOSIS — K5730 Diverticulosis of large intestine without perforation or abscess without bleeding: Secondary | ICD-10-CM | POA: Diagnosis not present

## 2021-04-28 DIAGNOSIS — K5790 Diverticulosis of intestine, part unspecified, without perforation or abscess without bleeding: Secondary | ICD-10-CM | POA: Diagnosis not present

## 2021-04-28 DIAGNOSIS — K644 Residual hemorrhoidal skin tags: Secondary | ICD-10-CM | POA: Diagnosis not present

## 2021-04-28 DIAGNOSIS — K635 Polyp of colon: Secondary | ICD-10-CM | POA: Diagnosis not present

## 2021-04-28 HISTORY — PX: COLONOSCOPY WITH PROPOFOL: SHX5780

## 2021-04-28 LAB — HM COLONOSCOPY

## 2021-04-28 SURGERY — COLONOSCOPY WITH PROPOFOL
Anesthesia: General

## 2021-04-28 MED ORDER — PROPOFOL 500 MG/50ML IV EMUL
INTRAVENOUS | Status: DC | PRN
  Administered 2021-04-28: 140 ug/kg/min via INTRAVENOUS

## 2021-04-28 MED ORDER — PROPOFOL 10 MG/ML IV BOLUS
INTRAVENOUS | Status: DC | PRN
  Administered 2021-04-28: 70 mg via INTRAVENOUS

## 2021-04-28 MED ORDER — SODIUM CHLORIDE 0.9 % IV SOLN
INTRAVENOUS | Status: DC
  Administered 2021-04-28: 1000 mL via INTRAVENOUS

## 2021-04-28 MED ORDER — LIDOCAINE HCL (CARDIAC) PF 100 MG/5ML IV SOSY
PREFILLED_SYRINGE | INTRAVENOUS | Status: DC | PRN
  Administered 2021-04-28: 60 mg via INTRAVENOUS

## 2021-04-28 NOTE — Transfer of Care (Signed)
Immediate Anesthesia Transfer of Care Note  Patient: Whitney Carr  Procedure(s) Performed: COLONOSCOPY WITH PROPOFOL  Patient Location: PACU  Anesthesia Type:General  Level of Consciousness: awake and alert   Airway & Oxygen Therapy: Patient Spontanous Breathing  Post-op Assessment: Report given to RN and Post -op Vital signs reviewed and stable  Post vital signs: Reviewed and stable  Last Vitals:  Vitals Value Taken Time  BP    Temp    Pulse    Resp    SpO2      Last Pain:  Vitals:   04/28/21 0931  TempSrc: Temporal  PainSc:          Complications: No notable events documented.

## 2021-04-28 NOTE — H&P (Signed)
[facility] Pre-Procedure H&P   Patient ID: Whitney Carr is a 75 y.o. female.  Gastroenterology Provider: Annamaria Helling, DO  Referring Provider: Laurine Blazer, PA PCP: Einar Pheasant, MD  Date: 04/28/2021  HPI Whitney Carr is a 75 y.o. female who presents today for Colonoscopy for surveillance, personal history of colon polyps. S/p hysterectomy. 2014 colonoscopy with Dr. Gustavo Lah demonstrating diverticulosis, lipoma in ascending colon and 2 tubular adenomas  Patient denies nausea, vomiting, coffee ground emesis, hematemesis, abdominal pain, diarrhea, constipation, melena, hematochezia, fever, chills, dysphagia, odynophagia, jaundice, heartburn/reflux. No fhx of crc or polyps   Past Medical History:  Diagnosis Date   Degenerative joint disease    GERD (gastroesophageal reflux disease)    Hypercholesterolemia    Hypertension     Past Surgical History:  Procedure Laterality Date   APPENDECTOMY  1969   TONSILECTOMY/ADENOIDECTOMY WITH MYRINGOTOMY  [place]   secondary to bleeding    Family History No h/o GI disease or malignancy  Review of Systems  Constitutional:  Negative for activity change, appetite change, fatigue, fever and unexpected weight change.  HENT:  Negative for trouble swallowing and voice change.   Respiratory:  Negative for shortness of breath and wheezing.   Cardiovascular:  Negative for chest pain and palpitations.  Gastrointestinal:  Negative for abdominal distention, abdominal pain, anal bleeding, blood in stool, constipation, diarrhea, nausea, rectal pain and vomiting.  Musculoskeletal:  Negative for arthralgias and myalgias.  Skin:  Negative for color change and pallor.  Neurological:  Negative for dizziness, syncope and weakness.  Psychiatric/Behavioral:  Negative for confusion.   All other systems reviewed and are negative.   Medications No current  facility-administered medications on file prior to encounter.   Current Outpatient Medications on File Prior to Encounter  Medication Sig Dispense Refill   amLODipine (NORVASC) 2.5 MG tablet Take 1 tablet (2.5 mg total) by mouth daily. 90 tablet 3   Calcium 600-200 MG-UNIT per tablet Take 1 tablet by mouth 2 (two) times daily.     cholecalciferol (VITAMIN D) 1000 UNITS tablet Take 1,000 Units by mouth daily.     losartan (COZAAR) 50 MG tablet TAKE (2) TABLETS BY MOUTH ONCE DAILY. 180 tablet 3    Pertinent medications related to GI and procedure were reviewed by me with the patient prior to the procedure   Current Facility-Administered Medications:    0.9 %  sodium chloride infusion, , Intravenous, Continuous, Annamaria Helling, DO, Last Rate: 20 mL/hr at 04/28/21 0827, Continued from Pre-op at 04/28/21 0827  sodium chloride 20 mL/hr at 04/28/21 0827       No Known Allergies Allergies were reviewed by me prior to the procedure  Objective    Vitals:   04/28/21 0757  BP: (!) 159/66  Pulse: (!) 104  Resp: 16  Temp: 97.9 F (36.6 C)  TempSrc: Temporal  SpO2: 100%  Weight: 69.1 kg  Height: 5\' 5"  (1.651 m)     Physical Exam Vitals reviewed.  Constitutional:      General: She is not in acute distress.    Appearance: Normal appearance. She is not ill-appearing, toxic-appearing or diaphoretic.  HENT:     Head: Normocephalic and atraumatic.     Nose: Nose normal.     Mouth/Throat:     Mouth: Mucous membranes are moist.     Pharynx: Oropharynx is clear.  Eyes:     General: No scleral icterus.  Extraocular Movements: Extraocular movements intact.  Cardiovascular:     Rate and Rhythm: Regular rhythm. Tachycardia present.     Heart sounds: Normal heart sounds. No murmur heard.   No friction rub. No gallop.  Pulmonary:     Effort: Pulmonary effort is normal. No respiratory distress.     Breath sounds: Normal breath sounds. No wheezing, rhonchi or rales.  Abdominal:      General: Bowel sounds are normal. There is no distension.     Palpations: Abdomen is soft.     Tenderness: There is no abdominal tenderness. There is no guarding or rebound.  Musculoskeletal:     Cervical back: Neck supple.     Right lower leg: No edema.     Left lower leg: No edema.  Skin:    General: Skin is warm and dry.     Coloration: Skin is not jaundiced or pale.  Neurological:     General: No focal deficit present.     Mental Status: She is alert and oriented to person, place, and time. Mental status is at baseline.  Psychiatric:        Mood and Affect: Mood normal.        Behavior: Behavior normal.        Thought Content: Thought content normal.        Judgment: Judgment normal.     Assessment:  Whitney Carr is a 75 y.o. female  who presents today for Colonoscopy for surveillance, personal history of colon polyps.  Plan:  Colonoscopy with possible intervention today  Colonoscopy with possible biopsy, control of bleeding, polypectomy, and interventions as necessary has been discussed with the patient/patient representative. Informed consent was obtained from the patient/patient representative after explaining the indication, nature, and risks of the procedure including but not limited to death, bleeding, perforation, missed neoplasm/lesions, cardiorespiratory compromise, and reaction to medications. Opportunity for questions was given and appropriate answers were provided. Patient/patient representative has verbalized understanding is amenable to undergoing the procedure.   Annamaria Helling, DO  [facility]  Portions of the record may have been created with voice recognition software. Occasional wrong-word or 'sound-a-like' substitutions may have occurred due to the inherent limitations of voice recognition software.  Read the chart carefully and recognize, using context, where substitutions may have occurred.

## 2021-04-28 NOTE — Op Note (Signed)
[facility] Patient Name: Whitney Carr Procedure Date: 04/28/2021 8:25 AM MRN: [id number] Account #: [id number] Date of Birth: [birth year] Admit Type: Outpatient Age: 75 Room: [facility] ENDO ROOM 1 Gender: Female Note Status: Finalized Instrument Name: Colonscope [id number] Procedure:             Colonoscopy Indications:           High risk colon cancer surveillance: Personal history                         of colonic polyps Providers:             Annamaria Helling DO, DO Referring MD:          Einar Pheasant, MD (Referring MD) Medicines:             Monitored Anesthesia Care Complications:         No immediate complications. Estimated blood loss:                         Minimal. Procedure:             Pre-Anesthesia Assessment:                        - Prior to the procedure, a History and Physical was                         performed, and patient medications and allergies were                         reviewed. The patient is competent. The risks and                         benefits of the procedure and the sedation options and                         risks were discussed with the patient. All questions                         were answered and informed consent was obtained.                         Patient identification and proposed procedure were                         verified by the physician, the nurse, the anesthetist                         and the technician in the endoscopy suite. Mental                         Status Examination: alert and oriented. Airway                         Examination: normal oropharyngeal airway and neck                         mobility. Respiratory Examination: clear to  auscultation. CV Examination: RRR, no murmurs, no S3                         or S4. Prophylactic Antibiotics: The patient does not                         require prophylactic antibiotics. Prior                          Anticoagulants: The patient has taken no previous                         anticoagulant or antiplatelet agents. ASA Grade                         Assessment: II - A patient with mild systemic disease.                         After reviewing the risks and benefits, the patient                         was deemed in satisfactory condition to undergo the                         procedure. The anesthesia plan was to use monitored                         anesthesia care (MAC). Immediately prior to                         administration of medications, the patient was                         re-assessed for adequacy to receive sedatives. The                         heart rate, respiratory rate, oxygen saturations,                         blood pressure, adequacy of pulmonary ventilation, and                         response to care were monitored throughout the                         procedure. The physical status of the patient was                         re-assessed after the procedure.                        After obtaining informed consent, the colonoscope was                         passed under direct vision. Throughout the procedure,                         the patient's blood pressure, pulse, and oxygen  saturations were monitored continuously. The                         Colonoscope was introduced through the anus and                         advanced to the the terminal ileum, with                         identification of the appendiceal orifice and IC                         valve. The colonoscopy was performed without                         difficulty. The patient tolerated the procedure well.                         The quality of the bowel preparation was evaluated                         using the BBPS [facility] Bowel Preparation Scale) with                         scores of: Right Colon = 2 (minor amount of residual                         staining, small  fragments of stool and/or opaque                         liquid, but mucosa seen well), Transverse Colon = 3                         (entire mucosa seen well with no residual staining,                         small fragments of stool or opaque liquid) and Left                         Colon = 3 (entire mucosa seen well with no residual                         staining, small fragments of stool or opaque liquid).                         The total BBPS score equals 8. The quality of the                         bowel preparation was excellent. The terminal ileum,                         ileocecal valve, appendiceal orifice, and rectum were                         photographed. Findings:      Skin tags were found on perianal exam.      The digital rectal exam was normal. Pertinent negatives  include normal       sphincter tone.      The terminal ileum appeared normal.      Non-bleeding internal hemorrhoids were found during retroflexion. The       hemorrhoids were Grade I (internal hemorrhoids that do not prolapse).      A few small-mouthed diverticula were found in the recto-sigmoid colon       and sigmoid colon. Estimated blood loss: none.      A 2 to 3 mm polyp was found in the cecum. The polyp was sessile. The       polyp was removed with a cold biopsy forceps. Resection and retrieval       were complete. Estimated blood loss was minimal.      A 6 to 7 mm polyp was found in the hepatic flexure. The polyp was       sessile. The polyp was removed with a cold snare. Resection and       retrieval were complete. Estimated blood loss was minimal.      A 15 to 18 mm polyp was found in the hepatic flexure. The polyp was       semi-pedunculated. The polyp was removed with a hot snare. Resection and       retrieval were complete. Estimated blood loss was minimal. To prevent       bleeding after the polypectomy, two hemostatic clips were successfully       placed (MR conditional). There was no  bleeding at the end of the       procedure.      A 2 to 3 mm polyp was found in the transverse colon. The polyp was       sessile. The polyp was removed with a cold biopsy forceps. Resection and       retrieval were complete. Estimated blood loss was minimal.      Two sessile polyps were found in the transverse colon. The polyps were 6       to 7 mm in size. These polyps were removed with a cold snare. Resection       and retrieval were complete. Estimated blood loss was minimal.      The entire examined colon appeared normal.      A 2 to 3 mm polyp was found in the descending colon. The polyp was       sessile. The polyp was removed with a cold biopsy forceps. Resection and       retrieval were complete. Estimated blood loss was minimal.      The exam was otherwise without abnormality on direct and retroflexion       views. Impression:            - Perianal skin tags found on perianal exam.                        - The examined portion of the ileum was normal.                        - Non-bleeding internal hemorrhoids.                        - Diverticulosis in the recto-sigmoid colon and in the                         sigmoid colon.                        -  One 2 to 3 mm polyp in the cecum, removed with a                         cold biopsy forceps. Resected and retrieved.                        - One 6 to 7 mm polyp at the hepatic flexure, removed                         with a cold snare. Resected and retrieved.                        - One 15 to 18 mm polyp at the hepatic flexure,                         removed with a hot snare. Resected and retrieved.                         Clips (MR conditional) were placed.                        - One 2 to 3 mm polyp in the transverse colon, removed                         with a cold biopsy forceps. Resected and retrieved.                        - Two 6 to 7 mm polyps in the transverse colon,                         removed with a cold snare.  Resected and retrieved.                        - The entire examined colon is normal.                        - One 2 to 3 mm polyp in the descending colon, removed                         with a cold biopsy forceps. Resected and retrieved.                        - The examination was otherwise normal on direct and                         retroflexion views. Recommendation:        - Discharge patient to home.                        - Resume previous diet.                        - No aspirin, ibuprofen, naproxen, or other                         non-steroidal anti-inflammatory drugs for 5 days after  polyp removal.                        - Continue present medications.                        - Await pathology results.                        - Repeat colonoscopy for surveillance based on                         pathology results.                        - Return to referring physician as previously                         scheduled. Procedure Code(s):     --- Professional ---                        [phone], Colonoscopy, flexible; with removal of                         tumor(s), polyp(s), or other lesion(s) by snare                         technique                        45380, 48, Colonoscopy, flexible; with biopsy, single                         or multiple Diagnosis Code(s):     --- Professional ---                        K63.5, Polyp of colon                        Z86.010, Personal history of colonic polyps                        K64.0, First degree hemorrhoids                        K64.4, Residual hemorrhoidal skin tags                        K57.30, Diverticulosis of large intestine without                         perforation or abscess without bleeding CPT copyright 2019 American Medical Association. All rights reserved. The codes documented in this report are preliminary and upon coder review may  be revised to meet current compliance  requirements. Attending Participation:      I personally performed the entire procedure. Volney American, DO Annamaria Helling DO, DO 04/28/2021 9:41:14 AM This report has been signed electronically. Number of Addenda: 0 Note Initiated On: 04/28/2021 8:25 AM Scope Withdrawal Time: 0 hours 37 minutes 30 seconds  Total Procedure Duration: 0 hours 41 minutes 38 seconds  Estimated Blood Loss:  Estimated blood loss was minimal.  [facility]

## 2021-04-28 NOTE — Interval H&P Note (Signed)
History and Physical Interval Note: Preprocedure H&P from 04/28/21  was reviewed and there was no interval change after seeing and examining the patient.  Written consent was obtained from the patient after discussion of risks, benefits, and alternatives. Patient has consented to proceed with Colonoscopy with possible intervention   04/28/2021 8:37 AM  Whitney Carr  has presented today for surgery, with the diagnosis of PH Colonic Polyps.  The various methods of treatment have been discussed with the patient and family. After consideration of risks, benefits and other options for treatment, the patient has consented to  Procedure(s): COLONOSCOPY WITH PROPOFOL (N/A) as a surgical intervention.  The patient's history has been reviewed, patient examined, no change in status, stable for surgery.  I have reviewed the patient's chart and labs.  Questions were answered to the patient's satisfaction.     Annamaria Helling

## 2021-04-28 NOTE — Anesthesia Postprocedure Evaluation (Signed)
Anesthesia Post Note  Patient: Whitney Carr  Procedure(s) Performed: COLONOSCOPY WITH PROPOFOL  Patient location during evaluation: PACU Anesthesia Type: General Level of consciousness: awake and oriented Pain management: satisfactory to patient Vital Signs Assessment: post-procedure vital signs reviewed and stable Respiratory status: spontaneous breathing and respiratory function stable Cardiovascular status: blood pressure returned to baseline Anesthetic complications: no   No notable events documented.   Last Vitals:  Vitals:   04/28/21 0931 04/28/21 0941  BP: 117/64 125/74  Pulse: 74 86  Resp: 19 11  Temp: (!) 35.7 C   SpO2:  100%    Last Pain:  Vitals:   04/28/21 0931  TempSrc: Temporal  PainSc: Asleep                 VAN STAVEREN,Avonte Sensabaugh

## 2021-04-28 NOTE — Anesthesia Preprocedure Evaluation (Signed)
Anesthesia Evaluation  Patient identified by MRN, date of birth, ID band Patient awake    Reviewed: Allergy & Precautions, NPO status , Patient's Chart, lab work & pertinent test results  Airway Mallampati: II  TM Distance: >3 FB Neck ROM: full    Dental  (+) Teeth Intact   Pulmonary neg pulmonary ROS,    Pulmonary exam normal breath sounds clear to auscultation       Cardiovascular Exercise Tolerance: Good hypertension, Pt. on medications negative cardio ROS Normal cardiovascular exam Rhythm:Regular Rate:Normal     Neuro/Psych negative neurological ROS  negative psych ROS   GI/Hepatic negative GI ROS, Neg liver ROS, GERD  ,  Endo/Other  negative endocrine ROS  Renal/GU negative Renal ROS  negative genitourinary   Musculoskeletal   Abdominal Normal abdominal exam  (+)   Peds negative pediatric ROS (+)  Hematology negative hematology ROS (+)   Anesthesia Other Findings Past Medical History: No date: Degenerative joint disease No date: GERD (gastroesophageal reflux disease) No date: Hypercholesterolemia No date: Hypertension  Past Surgical History: 1969: APPENDECTOMY 1970: TONSILECTOMY/ADENOIDECTOMY WITH MYRINGOTOMY 1969: TUBAL LIGATION 1987: VAGINAL HYSTERECTOMY     Comment:  secondary to bleeding  BMI    Body Mass Index: 25.35 kg/m      Reproductive/Obstetrics negative OB ROS                             Anesthesia Physical Anesthesia Plan  ASA: 2  Anesthesia Plan: General   Post-op Pain Management:    Induction: Intravenous  PONV Risk Score and Plan: Propofol infusion and TIVA  Airway Management Planned: Nasal Cannula  Additional Equipment:   Intra-op Plan:   Post-operative Plan:   Informed Consent: I have reviewed the patients History and Physical, chart, labs and discussed the procedure including the risks, benefits and alternatives for the proposed  anesthesia with the patient or authorized representative who has indicated his/her understanding and acceptance.     Dental Advisory Given  Plan Discussed with: CRNA and Surgeon  Anesthesia Plan Comments:         Anesthesia Quick Evaluation

## 2021-04-29 ENCOUNTER — Encounter: Payer: Self-pay | Admitting: Gastroenterology

## 2021-04-29 LAB — SURGICAL PATHOLOGY: SURGICAL PATHOLOGY: NEGATIVE

## 2021-05-12 DIAGNOSIS — M170 Bilateral primary osteoarthritis of knee: Secondary | ICD-10-CM | POA: Diagnosis not present

## 2021-05-17 ENCOUNTER — Other Ambulatory Visit: Payer: Self-pay

## 2021-05-17 ENCOUNTER — Ambulatory Visit
Admission: RE | Admit: 2021-05-17 | Discharge: 2021-05-17 | Disposition: A | Discharge status: Home / Self Care | Payer: Medicare Other | Source: Ambulatory Visit | Attending: Internal Medicine | Admitting: Internal Medicine

## 2021-05-17 DIAGNOSIS — Z1231 Encounter for screening mammogram for malignant neoplasm of breast: Secondary | ICD-10-CM | POA: Insufficient documentation

## 2021-07-13 ENCOUNTER — Other Ambulatory Visit: Payer: Self-pay

## 2021-07-13 ENCOUNTER — Other Ambulatory Visit (INDEPENDENT_AMBULATORY_CARE_PROVIDER_SITE_OTHER): Payer: Medicare Other

## 2021-07-13 DIAGNOSIS — E7800 Pure hypercholesterolemia, unspecified: Secondary | ICD-10-CM

## 2021-07-13 DIAGNOSIS — R739 Hyperglycemia, unspecified: Secondary | ICD-10-CM

## 2021-07-13 LAB — BASIC METABOLIC PANEL
BUN: 18 mg/dL (ref 6–23)
CO2: 27 mEq/L (ref 19–32)
Calcium: 10 mg/dL (ref 8.4–10.5)
Chloride: 99 mEq/L (ref 96–112)
Creatinine, Ser: 0.88 mg/dL (ref 0.40–1.20)
GFR: 64.28 mL/min (ref >60.00)
Glucose, Bld: 92 mg/dL (ref 70–99)
Potassium: 4.1 mEq/L (ref 3.5–5.1)
Sodium: 136 mEq/L (ref 135–145)

## 2021-07-13 LAB — CBC WITH DIFFERENTIAL/PLATELET
Basophils Absolute: 0 10*3/uL (ref 0.0–0.1)
Basophils Relative: 0.8 % (ref 0.0–3.0)
Eosinophils Absolute: 0 10*3/uL (ref 0.0–0.7)
Eosinophils Relative: 0.8 % (ref 0.0–5.0)
HCT: 42.3 % (ref 36.0–46.0)
Hemoglobin: 13.8 g/dL (ref 12.0–15.0)
Lymphocytes Relative: 22.3 % (ref 12.0–46.0)
Lymphs Abs: 1.2 10*3/uL (ref 0.7–4.0)
MCHC: 32.6 g/dL (ref 30.0–36.0)
MCV: 93.1 fl (ref 78.0–100.0)
Monocytes Absolute: 0.4 10*3/uL (ref 0.1–1.0)
Monocytes Relative: 7.9 % (ref 3.0–12.0)
Neutro Abs: 3.8 10*3/uL (ref 1.4–7.7)
Neutrophils Relative %: 68.2 % (ref 43.0–77.0)
Platelets: 291 10*3/uL (ref 150.0–400.0)
RBC: 4.54 Mil/uL (ref 3.87–5.11)
RDW: 13.8 % (ref 11.5–15.5)
WBC: 5.6 10*3/uL (ref 4.0–10.5)

## 2021-07-13 LAB — HEPATIC FUNCTION PANEL
ALT: 8 U/L (ref 0–35)
AST: 10 U/L (ref 0–37)
Albumin: 4.5 g/dL (ref 3.5–5.2)
Alkaline Phosphatase: 49 U/L (ref 39–117)
Bilirubin, Direct: 0.1 mg/dL (ref 0.0–0.3)
Total Bilirubin: 0.7 mg/dL (ref 0.2–1.2)
Total Protein: 7 g/dL (ref 6.0–8.3)

## 2021-07-13 LAB — LIPID PANEL
Cholesterol: 189 mg/dL (ref 0–200)
HDL: 62.5 mg/dL (ref >39.00)
LDL Cholesterol: 107 mg/dL — ABNORMAL HIGH (ref 0–99)
NonHDL: 126.38
Total CHOL/HDL Ratio: 3
Triglycerides: 96 mg/dL (ref 0.0–149.0)
VLDL: 19.2 mg/dL (ref 0.0–40.0)

## 2021-07-13 LAB — HEMOGLOBIN A1C: Hgb A1c MFr Bld: 5.9 % (ref 4.6–6.5)

## 2021-07-15 ENCOUNTER — Ambulatory Visit (INDEPENDENT_AMBULATORY_CARE_PROVIDER_SITE_OTHER): Payer: Medicare Other | Admitting: Internal Medicine

## 2021-07-15 ENCOUNTER — Other Ambulatory Visit: Payer: Self-pay

## 2021-07-15 VITALS — BP 124/76 | HR 67 | Temp 97.9°F | Resp 16 | Ht 66.0 in | Wt 151.4 lb

## 2021-07-15 DIAGNOSIS — R739 Hyperglycemia, unspecified: Secondary | ICD-10-CM

## 2021-07-15 DIAGNOSIS — G458 Other transient cerebral ischemic attacks and related syndromes: Secondary | ICD-10-CM

## 2021-07-15 DIAGNOSIS — Z860100 Personal history of colon polyps, unspecified: Secondary | ICD-10-CM

## 2021-07-15 DIAGNOSIS — I10 Essential (primary) hypertension: Secondary | ICD-10-CM | POA: Diagnosis not present

## 2021-07-15 DIAGNOSIS — E7800 Pure hypercholesterolemia, unspecified: Secondary | ICD-10-CM

## 2021-07-15 DIAGNOSIS — I700 Atherosclerosis of aorta: Secondary | ICD-10-CM | POA: Diagnosis not present

## 2021-07-15 NOTE — Progress Notes (Signed)
Patient ID: Whitney Carr, female   DOB: [birth year], 76 y.o.   MRN: [id number]   Subjective:    Patient ID: Whitney Carr, female    DOB: 1945/07/02, 76 y.o.   MRN: [id number]  This visit occurred during the SARS-CoV-2 public health emergency.  Safety protocols were in place, including screening questions prior to the visit, additional usage of staff PPE, and extensive cleaning of exam room while observing appropriate contact time as indicated for disinfecting solutions.   Patient here for a scheduled follow up.   Chief Complaint  Patient presents with   Hypertension   Hyperlipidemia   .   Hypertension Pertinent negatives include no chest pain, headaches, palpitations or shortness of breath.  Hyperlipidemia Pertinent negatives include no chest pain, myalgias or shortness of breath.  Doing well.  Had covid recently.  No residual problems.  No chest pain or sob reported.  No increased cough or congestion.  No acid reflux or abdominal pain reported.  Bowels stable.  Recent colonoscopy.  Multiple polyps.  Due f/u in 3 years.     Past Medical History:  Diagnosis Date   Degenerative joint disease    GERD (gastroesophageal reflux disease)    Hypercholesterolemia    Hypertension    Past Surgical History:  Procedure Laterality Date   APPENDECTOMY  1969   COLONOSCOPY WITH PROPOFOL N/A 04/28/2021   Procedure: COLONOSCOPY WITH PROPOFOL;  Surgeon: Annamaria Helling, DO;  Location: [facility] ENDOSCOPY;  Service: Gastroenterology;  Laterality: N/A;   TONSILECTOMY/ADENOIDECTOMY WITH MYRINGOTOMY  [place]   secondary to bleeding   Family History  Problem Relation Age of Onset   Congestive Heart Failure Mother    Thyroid disease Mother    Heart failure Father    Diabetes Father    Heart disease Sister        CABG   Alzheimer's disease Sister    Diabetes Sister    Diabetes Sister    Stroke Brother    COPD Brother    COPD Brother     Colon cancer Brother    Hypertension Brother    Heart disease Brother    Breast cancer Daughter    Autoimmune disease Daughter    Social History   Socioeconomic History   Marital status: Married    Spouse name: Not on file   Number of children: 3   Years of education: Not on file   Highest education level: Not on file  Occupational History   Not on file  Tobacco Use   Smoking status: Never   Smokeless tobacco: Never  Vaping Use   Vaping Use: Never used  Substance and Sexual Activity   Alcohol use: No    Alcohol/week: 0.0 standard drinks   Drug use: No   Sexual activity: Yes  Other Topics Concern   Not on file  Social History Narrative   Not on file   Social Determinants of Health   Financial Resource Strain: Low Risk    Difficulty of Paying Living Expenses: Not hard at all  Food Insecurity: No Food Insecurity   Worried About Charity fundraiser in the Last Year: Never true   Ran Out of Food in the Last Year: Never true  Transportation Needs: No Transportation Needs   Lack of Transportation (Medical): No   Lack of Transportation (Non-Medical): No  Physical Activity: Sufficiently Active   Days of Exercise per Week: 5 days  Minutes of Exercise per Session: 30 min  Stress: No Stress Concern Present   Feeling of Stress : Not at all  Social Connections: Unknown   Frequency of Communication with Friends and Family: Not on file   Frequency of Social Gatherings with Friends and Family: Not on file   Attends Religious Services: Not on file   Active Member of Clubs or Organizations: Not on file   Attends Archivist Meetings: Not on file   Marital Status: Married     Review of Systems  Constitutional:  Negative for appetite change and unexpected weight change.  HENT:  Negative for congestion and sinus pressure.   Respiratory:  Negative for cough, chest tightness and shortness of breath.   Cardiovascular:  Negative for chest pain and palpitations.   Gastrointestinal:  Negative for abdominal pain, diarrhea, nausea and vomiting.  Genitourinary:  Negative for difficulty urinating and dysuria.  Musculoskeletal:  Negative for joint swelling and myalgias.  Skin:  Negative for color change and rash.  Neurological:  Negative for dizziness, light-headedness and headaches.  Psychiatric/Behavioral:  Negative for agitation and dysphoric mood.       Objective:     BP 124/76    Pulse 67    Temp 97.9 F (36.6 C)    Resp 16    Ht _0  (1.676 m)    Wt 151 lb 6.4 oz (68.7 kg)    LMP 06/03/1986    SpO2 98%    BMI 24.44 kg/m  Wt Readings from Last 3 Encounters:  07/15/21 151 lb 6.4 oz (68.7 kg)  04/28/21 152 lb 5.1 oz (69.1 kg)  04/12/21 154 lb (69.9 kg)    Physical Exam Vitals reviewed.  Constitutional:      General: She is not in acute distress.    Appearance: Normal appearance.  HENT:     Head: Normocephalic and atraumatic.     Right Ear: External ear normal.     Left Ear: External ear normal.  Eyes:     General: No scleral icterus.       Right eye: No discharge.        Left eye: No discharge.     Conjunctiva/sclera: Conjunctivae normal.  Neck:     Thyroid: No thyromegaly.  Cardiovascular:     Rate and Rhythm: Normal rate and regular rhythm.  Pulmonary:     Effort: No respiratory distress.     Breath sounds: Normal breath sounds. No wheezing.  Abdominal:     General: Bowel sounds are normal.     Palpations: Abdomen is soft.     Tenderness: There is no abdominal tenderness.  Musculoskeletal:        General: No swelling or tenderness.     Cervical back: Neck supple. No tenderness.  Lymphadenopathy:     Cervical: No cervical adenopathy.  Skin:    Findings: No erythema or rash.  Neurological:     Mental Status: She is alert.  Psychiatric:        Mood and Affect: Mood normal.        Behavior: Behavior normal.     Outpatient Encounter Medications as of 07/15/2021  Medication Sig   amLODipine (NORVASC) 2.5 MG tablet Take  1 tablet (2.5 mg total) by mouth daily.   Calcium 600-200 MG-UNIT per tablet Take 1 tablet by mouth 2 (two) times daily.   cholecalciferol (VITAMIN D) 1000 UNITS tablet Take 1,000 Units by mouth daily.   ezetimibe (ZETIA) 10 MG tablet Take q  Monday, Wednesday and Friday.   losartan (COZAAR) 50 MG tablet TAKE (2) TABLETS BY MOUTH ONCE DAILY.   No facility-administered encounter medications on file as of 07/15/2021.     Lab Results  Component Value Date   WBC 5.6 07/13/2021   HGB 13.8 07/13/2021   HCT 42.3 07/13/2021   PLT 291.0 07/13/2021   GLUCOSE 92 07/13/2021   CHOL 189 07/13/2021   TRIG 96.0 07/13/2021   HDL 62.50 07/13/2021   LDLDIRECT 143.6 05/13/2012   LDLCALC 107 (H) 07/13/2021   ALT 8 07/13/2021   AST 10 07/13/2021   NA 136 07/13/2021   K 4.1 07/13/2021   CL 99 07/13/2021   CREATININE 0.88 07/13/2021   BUN 18 07/13/2021   CO2 27 07/13/2021   TSH 2.41 03/11/2021   HGBA1C 5.9 07/13/2021    MM 3D SCREEN BREAST BILATERAL  Result Date: 05/17/2021 CLINICAL DATA:  Screening. EXAM: DIGITAL SCREENING BILATERAL MAMMOGRAM WITH TOMOSYNTHESIS AND CAD TECHNIQUE: Bilateral screening digital craniocaudal and mediolateral oblique mammograms were obtained. Bilateral screening digital breast tomosynthesis was performed. The images were evaluated with computer-aided detection. COMPARISON:  Previous exam(s). ACR Breast Density Category b: There are scattered areas of fibroglandular density. FINDINGS: There are no findings suspicious for malignancy. IMPRESSION: No mammographic evidence of malignancy. A result letter of this screening mammogram will be mailed directly to the patient. RECOMMENDATION: Screening mammogram in one year. (Code:SM-B-01Y) BI-RADS CATEGORY  1: Negative. Electronically Signed   By: Abelardo Diesel M.D.   On: 05/17/2021 10:50      Assessment & Plan:   Problem List Items Addressed This Visit     Aortic atherosclerosis ([place])    intolerant to statin medication and  zetia.  Discussed repatha.  She desires to hold on starting.         History of colonic polyps    Colonoscopy 04/2021.  Recommended f/u in 3 years.       Hypercholesteremia - Primary    The 10-year ASCVD risk score (Arnett DK, et al., 2019) is: 19.8%   Values used to calculate the score:     Age: 73 years     Sex: Female     Is Non-Hispanic African American: No     Diabetic: No     Tobacco smoker: No     Systolic Blood Pressure: 811 mmHg     Is BP treated: Yes     HDL Cholesterol: 62.5 mg/dL     Total Cholesterol: 189 mg/dL  Intolerant to statin medication.  Discussed other treatment options - repatha.  Wants to hold on starting.  Follow lipid panel.  Low cholesterol diet and exercise.        Relevant Orders   Lipid panel   Hepatic function panel   Basic metabolic panel   Hyperglycemia    Low carb diet and exercise.  Follow met b and a1c.   Lab Results  Component Value Date   HGBA1C 5.9 07/13/2021       Relevant Orders   Hemoglobin A1c   Hypertension    Continue amlodipine and losartan.  Blood pressure as outlined.  Follow pressures.  Follow metabolic panel.       Subclavian steal syndrome    S/p revascularization.  Continue risk factor modification.  Continues f/u with [facility].         Einar Pheasant, MD

## 2021-07-18 ENCOUNTER — Encounter: Payer: Self-pay | Admitting: Internal Medicine

## 2021-07-18 NOTE — Assessment & Plan Note (Signed)
intolerant to statin medication and zetia.  Discussed repatha.  She desires to hold on starting.

## 2021-07-18 NOTE — Assessment & Plan Note (Signed)
Colonoscopy 04/2021.  Recommended f/u in 3 years.  

## 2021-07-18 NOTE — Assessment & Plan Note (Signed)
S/p revascularization.  Continue risk factor modification.  Continues f/u with [facility].

## 2021-07-18 NOTE — Assessment & Plan Note (Signed)
The 10-year ASCVD risk score (Arnett DK, et al., 2019) is: 19.8%   Values used to calculate the score:     Age: 76 years     Sex: Female     Is Non-Hispanic African American: No     Diabetic: No     Tobacco smoker: No     Systolic Blood Pressure: 210 mmHg     Is BP treated: Yes     HDL Cholesterol: 62.5 mg/dL     Total Cholesterol: 189 mg/dL  Intolerant to statin medication.  Discussed other treatment options - repatha.  Wants to hold on starting.  Follow lipid panel.  Low cholesterol diet and exercise.

## 2021-07-18 NOTE — Assessment & Plan Note (Signed)
Low carb diet and exercise.  Follow met b and a1c.   Lab Results  Component Value Date   HGBA1C 5.9 07/13/2021

## 2021-07-18 NOTE — Assessment & Plan Note (Signed)
Continue amlodipine and losartan.  Blood pressure as outlined.  Follow pressures.  Follow metabolic panel.  

## 2021-08-17 ENCOUNTER — Other Ambulatory Visit: Payer: Self-pay | Admitting: Internal Medicine

## 2021-08-22 DIAGNOSIS — M170 Bilateral primary osteoarthritis of knee: Secondary | ICD-10-CM | POA: Diagnosis not present

## 2021-08-28 ENCOUNTER — Encounter: Payer: Self-pay | Admitting: Internal Medicine

## 2021-08-28 DIAGNOSIS — M179 Osteoarthritis of knee, unspecified: Secondary | ICD-10-CM | POA: Insufficient documentation

## 2021-11-01 DIAGNOSIS — R0989 Other specified symptoms and signs involving the circulatory and respiratory systems: Secondary | ICD-10-CM | POA: Diagnosis not present

## 2021-11-01 DIAGNOSIS — I10 Essential (primary) hypertension: Secondary | ICD-10-CM | POA: Diagnosis not present

## 2021-11-01 DIAGNOSIS — G458 Other transient cerebral ischemic attacks and related syndromes: Secondary | ICD-10-CM | POA: Diagnosis not present

## 2021-11-01 DIAGNOSIS — R0602 Shortness of breath: Secondary | ICD-10-CM | POA: Diagnosis not present

## 2021-11-01 DIAGNOSIS — I700 Atherosclerosis of aorta: Secondary | ICD-10-CM | POA: Diagnosis not present

## 2021-11-16 ENCOUNTER — Other Ambulatory Visit: Payer: Self-pay | Admitting: Internal Medicine

## 2021-11-16 ENCOUNTER — Other Ambulatory Visit (INDEPENDENT_AMBULATORY_CARE_PROVIDER_SITE_OTHER): Payer: Medicare Other

## 2021-11-16 DIAGNOSIS — E7800 Pure hypercholesterolemia, unspecified: Secondary | ICD-10-CM

## 2021-11-16 DIAGNOSIS — R739 Hyperglycemia, unspecified: Secondary | ICD-10-CM

## 2021-11-16 LAB — HEPATIC FUNCTION PANEL
ALT: 14 U/L (ref 0–35)
AST: 15 U/L (ref 0–37)
Albumin: 4.2 g/dL (ref 3.5–5.2)
Alkaline Phosphatase: 55 U/L (ref 39–117)
Bilirubin, Direct: 0.1 mg/dL (ref 0.0–0.3)
Total Bilirubin: 0.8 mg/dL (ref 0.2–1.2)
Total Protein: 6.9 g/dL (ref 6.0–8.3)

## 2021-11-16 LAB — LIPID PANEL
Cholesterol: 199 mg/dL (ref 0–200)
HDL: 57.2 mg/dL (ref >39.00)
LDL Cholesterol: 117 mg/dL — ABNORMAL HIGH (ref 0–99)
NonHDL: 141.69
Total CHOL/HDL Ratio: 3
Triglycerides: 125 mg/dL (ref 0.0–149.0)
VLDL: 25 mg/dL (ref 0.0–40.0)

## 2021-11-16 LAB — BASIC METABOLIC PANEL
BUN: 12 mg/dL (ref 6–23)
CO2: 29 mEq/L (ref 19–32)
Calcium: 9.8 mg/dL (ref 8.4–10.5)
Chloride: 100 mEq/L (ref 96–112)
Creatinine, Ser: 0.8 mg/dL (ref 0.40–1.20)
GFR: 71.89 mL/min (ref >60.00)
Glucose, Bld: 96 mg/dL (ref 70–99)
Potassium: 4 mEq/L (ref 3.5–5.1)
Sodium: 138 mEq/L (ref 135–145)

## 2021-11-16 LAB — HEMOGLOBIN A1C: Hgb A1c MFr Bld: 5.7 % (ref 4.6–6.5)

## 2021-11-18 ENCOUNTER — Encounter: Payer: Medicare Other | Admitting: Internal Medicine

## 2021-12-15 DIAGNOSIS — M170 Bilateral primary osteoarthritis of knee: Secondary | ICD-10-CM | POA: Diagnosis not present

## 2021-12-23 ENCOUNTER — Encounter: Payer: Self-pay | Admitting: Internal Medicine

## 2021-12-23 ENCOUNTER — Ambulatory Visit (INDEPENDENT_AMBULATORY_CARE_PROVIDER_SITE_OTHER): Payer: Medicare Other | Admitting: Internal Medicine

## 2021-12-23 VITALS — BP 136/70 | HR 70 | Temp 98.2°F | Resp 15 | Ht 66.0 in | Wt 151.8 lb

## 2021-12-23 DIAGNOSIS — E7800 Pure hypercholesterolemia, unspecified: Secondary | ICD-10-CM | POA: Diagnosis not present

## 2021-12-23 DIAGNOSIS — R739 Hyperglycemia, unspecified: Secondary | ICD-10-CM | POA: Diagnosis not present

## 2021-12-23 DIAGNOSIS — R0989 Other specified symptoms and signs involving the circulatory and respiratory systems: Secondary | ICD-10-CM

## 2021-12-23 DIAGNOSIS — G720 Drug-induced myopathy: Secondary | ICD-10-CM | POA: Diagnosis not present

## 2021-12-23 DIAGNOSIS — I700 Atherosclerosis of aorta: Secondary | ICD-10-CM | POA: Diagnosis not present

## 2021-12-23 DIAGNOSIS — I8393 Asymptomatic varicose veins of bilateral lower extremities: Secondary | ICD-10-CM | POA: Diagnosis not present

## 2021-12-23 DIAGNOSIS — I10 Essential (primary) hypertension: Secondary | ICD-10-CM

## 2021-12-23 DIAGNOSIS — Z0000 Encounter for general adult medical examination without abnormal findings: Secondary | ICD-10-CM | POA: Diagnosis not present

## 2021-12-23 DIAGNOSIS — G458 Other transient cerebral ischemic attacks and related syndromes: Secondary | ICD-10-CM

## 2021-12-23 DIAGNOSIS — T466X5A Adverse effect of antihyperlipidemic and antiarteriosclerotic drugs, initial encounter: Secondary | ICD-10-CM | POA: Diagnosis not present

## 2021-12-23 DIAGNOSIS — Z86010 Personal history of colonic polyps: Secondary | ICD-10-CM

## 2021-12-23 MED ORDER — EZETIMIBE 10 MG PO TABS: ORAL_TABLET | ORAL | 3 refills | Status: DC

## 2021-12-23 MED ORDER — AMLODIPINE BESYLATE 2.5 MG PO TABS: 2.5000 mg | ORAL_TABLET | Freq: Every day | ORAL | 3 refills | Status: DC

## 2021-12-23 MED ORDER — LOSARTAN POTASSIUM 100 MG PO TABS: 100.0000 mg | ORAL_TABLET | Freq: Every day | ORAL | 3 refills | Status: DC

## 2021-12-23 NOTE — Progress Notes (Unsigned)
Patient ID: Whitney Carr, female   DOB: [birth year], 76 y.o.   MRN: [id number]   Subjective:    Patient ID: Whitney Carr, female    DOB: 1945-12-21, 76 y.o.   MRN: [id number]   Patient here for a physical exam.   Chief Complaint  Patient presents with   Hypertension   Hyperlipidemia   .   HPI Reports she is doing relatively well.  Tries to stay active.  Reports no chest pain or sob.  Recently saw Dr Ubaldo Glassing.  No changes made.  Since he is retiring, states her f/u is scheduled with Dr Saralyn Pilar next year.  No acid reflux reported.  No abdominal pain or bowel issues reported.  Taking and tolerating zetia three days per week.  Discussed repatha or praluent as alternative - given intolerance to statin medication.     Past Medical History:  Diagnosis Date   Degenerative joint disease    GERD (gastroesophageal reflux disease)    Hypercholesterolemia    Hypertension    Past Surgical History:  Procedure Laterality Date   APPENDECTOMY  1969   COLONOSCOPY WITH PROPOFOL N/A 04/28/2021   Procedure: COLONOSCOPY WITH PROPOFOL;  Surgeon: Annamaria Helling, DO;  Location: [facility] ENDOSCOPY;  Service: Gastroenterology;  Laterality: N/A;   TONSILECTOMY/ADENOIDECTOMY WITH MYRINGOTOMY  [place]   secondary to bleeding   Family History  Problem Relation Age of Onset   Congestive Heart Failure Mother    Thyroid disease Mother    Heart failure Father    Diabetes Father    Heart disease Sister        CABG   Alzheimer's disease Sister    Diabetes Sister    Diabetes Sister    Stroke Brother    COPD Brother    COPD Brother    Colon cancer Brother    Hypertension Brother    Heart disease Brother    Breast cancer Daughter    Autoimmune disease Daughter    Social History   Socioeconomic History   Marital status: Married    Spouse name: Not on file   Number of children: 3   Years of education: Not on file   Highest education level:  Not on file  Occupational History   Not on file  Tobacco Use   Smoking status: Never   Smokeless tobacco: Never  Vaping Use   Vaping Use: Never used  Substance and Sexual Activity   Alcohol use: No    Alcohol/week: 0.0 standard drinks of alcohol   Drug use: No   Sexual activity: Yes  Other Topics Concern   Not on file  Social History Narrative   Not on file   Social Determinants of Health   Financial Resource Strain: Low Risk  (04/12/2021)   Overall Financial Resource Strain (CARDIA)    Difficulty of Paying Living Expenses: Not hard at all  Food Insecurity: No Food Insecurity (04/12/2021)   Hunger Vital Sign    Worried About Running Out of Food in the Last Year: Never true    Ran Out of Food in the Last Year: Never true  Transportation Needs: No Transportation Needs (04/12/2021)   PRAPARE - Hydrologist (Medical): No    Lack of Transportation (Non-Medical): No  Physical Activity: Sufficiently Active (04/12/2021)   Exercise Vital Sign    Days of Exercise per Week: 5 days    Minutes of Exercise per  Session: 30 min  Stress: No Stress Concern Present (04/12/2021)   [place]    Feeling of Stress : Not at all  Social Connections: Unknown (04/12/2021)   Social Connection and Isolation Panel [NHANES]    Frequency of Communication with Friends and Family: Not on file    Frequency of Social Gatherings with Friends and Family: Not on file    Attends Religious Services: Not on file    Active Member of Clubs or Organizations: Not on file    Attends Archivist Meetings: Not on file    Marital Status: Married     Review of Systems  Constitutional:  Negative for appetite change and unexpected weight change.  HENT:  Negative for congestion, sinus pressure and sore throat.   Eyes:  Negative for pain and visual disturbance.  Respiratory:  Negative for cough, chest tightness and  shortness of breath.   Cardiovascular:  Negative for chest pain, palpitations and leg swelling.  Gastrointestinal:  Negative for abdominal pain, diarrhea, nausea and vomiting.  Genitourinary:  Negative for difficulty urinating and dysuria.  Musculoskeletal:  Negative for joint swelling and myalgias.  Skin:  Negative for color change and rash.  Neurological:  Negative for dizziness, light-headedness and headaches.  Hematological:  Negative for adenopathy. Does not bruise/bleed easily.  Psychiatric/Behavioral:  Negative for agitation and dysphoric mood.        Objective:     BP 136/70 (BP Location: Left Arm, Patient Position: Sitting, Cuff Size: Small)   Pulse 70   Temp 98.2 F (36.8 C) (Temporal)   Resp 15   Ht _0  (1.676 m)   Wt 151 lb 12.8 oz (68.9 kg)   LMP 06/03/1986   SpO2 98%   BMI 24.50 kg/m  Wt Readings from Last 3 Encounters:  12/23/21 151 lb 12.8 oz (68.9 kg)  07/15/21 151 lb 6.4 oz (68.7 kg)  04/28/21 152 lb 5.1 oz (69.1 kg)    Physical Exam Vitals reviewed.  Constitutional:      General: She is not in acute distress.    Appearance: Normal appearance. She is well-developed.  HENT:     Head: Normocephalic and atraumatic.     Right Ear: External ear normal.     Left Ear: External ear normal.  Eyes:     General: No scleral icterus.       Right eye: No discharge.        Left eye: No discharge.     Conjunctiva/sclera: Conjunctivae normal.  Neck:     Thyroid: No thyromegaly.  Cardiovascular:     Rate and Rhythm: Normal rate and regular rhythm.  Pulmonary:     Effort: No tachypnea, accessory muscle usage or respiratory distress.     Breath sounds: Normal breath sounds. No decreased breath sounds or wheezing.  Chest:  Breasts:    Right: No inverted nipple, mass, nipple discharge or tenderness (no axillary adenopathy).     Left: No inverted nipple, mass, nipple discharge or tenderness (no axilarry adenopathy).  Abdominal:     General: Bowel sounds are  normal.     Palpations: Abdomen is soft.     Tenderness: There is no abdominal tenderness.  Musculoskeletal:        General: No swelling or tenderness.     Cervical back: Neck supple.  Lymphadenopathy:     Cervical: No cervical adenopathy.  Skin:    Findings: No erythema or rash.  Neurological:  Mental Status: She is alert and oriented to person, place, and time.  Psychiatric:        Mood and Affect: Mood normal.        Behavior: Behavior normal.      Outpatient Encounter Medications as of 12/23/2021  Medication Sig   Calcium 600-200 MG-UNIT per tablet Take 1 tablet by mouth 2 (two) times daily.   cholecalciferol (VITAMIN D) 1000 UNITS tablet Take 1,000 Units by mouth daily.   losartan (COZAAR) 100 MG tablet Take 1 tablet (100 mg total) by mouth daily.   [DISCONTINUED] amLODipine (NORVASC) 2.5 MG tablet TAKE 1 TABLET BY MOUTH ONCE DAILY.   [DISCONTINUED] ezetimibe (ZETIA) 10 MG tablet Take q Monday, Wednesday and Friday.   [DISCONTINUED] losartan (COZAAR) 50 MG tablet TAKE (2) TABLETS BY MOUTH ONCE DAILY.   amLODipine (NORVASC) 2.5 MG tablet Take 1 tablet (2.5 mg total) by mouth daily.   ezetimibe (ZETIA) 10 MG tablet Take q Monday, Wednesday and Friday.   No facility-administered encounter medications on file as of 12/23/2021.     Lab Results  Component Value Date   WBC 5.6 07/13/2021   HGB 13.8 07/13/2021   HCT 42.3 07/13/2021   PLT 291.0 07/13/2021   GLUCOSE 96 11/16/2021   CHOL 199 11/16/2021   TRIG 125.0 11/16/2021   HDL 57.20 11/16/2021   LDLDIRECT 143.6 05/13/2012   LDLCALC 117 (H) 11/16/2021   ALT 14 11/16/2021   AST 15 11/16/2021   NA 138 11/16/2021   K 4.0 11/16/2021   CL 100 11/16/2021   CREATININE 0.80 11/16/2021   BUN 12 11/16/2021   CO2 29 11/16/2021   TSH 2.41 03/11/2021   HGBA1C 5.7 11/16/2021    MM 3D SCREEN BREAST BILATERAL  Result Date: 05/17/2021 CLINICAL DATA:  Screening. EXAM: DIGITAL SCREENING BILATERAL MAMMOGRAM WITH TOMOSYNTHESIS  AND CAD TECHNIQUE: Bilateral screening digital craniocaudal and mediolateral oblique mammograms were obtained. Bilateral screening digital breast tomosynthesis was performed. The images were evaluated with computer-aided detection. COMPARISON:  Previous exam(s). ACR Breast Density Category b: There are scattered areas of fibroglandular density. FINDINGS: There are no findings suspicious for malignancy. IMPRESSION: No mammographic evidence of malignancy. A result letter of this screening mammogram will be mailed directly to the patient. RECOMMENDATION: Screening mammogram in one year. (Code:SM-B-01Y) BI-RADS CATEGORY  1: Negative. Electronically Signed   By: Abelardo Diesel M.D.   On: 05/17/2021 10:50      Assessment & Plan:   Problem List Items Addressed This Visit     Aortic atherosclerosis ([place])    intolerant to statin medication.  Tolerating zetia three days per week.   Discussed repatha and praluent. She desires to hold on starting one of these, but will let me know if changes her mind.       Relevant Medications   ezetimibe (ZETIA) 10 MG tablet   amLODipine (NORVASC) 2.5 MG tablet   losartan (COZAAR) 100 MG tablet   Carotid bruit    Previously worked up by vascular surgery.  Had screening - read as normal exam.  Described minimal thickening- CCA and bulb bilaterally.  Stated no further w/up warranted.        Health care maintenance    Physical today 12/23/21.  Mammogram 05/17/21 - Birads I.  Colonoscopy 04/2021.        History of colonic polyps    Colonoscopy 04/2021.  Recommended f/u in 3 years.       Hypercholesteremia    The 10-year ASCVD risk score (Arnett  DK, et al., 2019) is: 23.5%   Values used to calculate the score:     Age: 23 years     Sex: Female     Is Non-Hispanic African American: No     Diabetic: No     Tobacco smoker: No     Systolic Blood Pressure: 563 mmHg     Is BP treated: Yes     HDL Cholesterol: 57.2 mg/dL     Total Cholesterol: 199 mg/dL  Intolerant  to statin medication.  Tolerating zetia three days per week. Discussed other treatment options - repatha.  Wants to hold on starting.  Follow lipid panel.  Low cholesterol diet and exercise.        Relevant Medications   ezetimibe (ZETIA) 10 MG tablet   amLODipine (NORVASC) 2.5 MG tablet   losartan (COZAAR) 100 MG tablet   Other Relevant Orders   Lipid Profile   Hepatic function panel   TSH   Hyperglycemia    Low carb diet and exercise.  Follow met b and a1c.   Lab Results  Component Value Date   HGBA1C 5.7 11/16/2021       Relevant Orders   HgB A1c   Hypertension    Continue amlodipine and losartan.  Blood pressure as outlined.  Follow pressures.  Follow metabolic panel.       Relevant Medications   ezetimibe (ZETIA) 10 MG tablet   amLODipine (NORVASC) 2.5 MG tablet   losartan (COZAAR) 100 MG tablet   Other Relevant Orders   Basic Metabolic Panel (BMET)   Statin myopathy    Had significant muscle aches with statin medication.  Tolerating zetia three days per week. Discussed repatha or praluent.  Will think about starting and let me know if decides.  Discussed importance of risk factor modification.       Subclavian steal syndrome    S/p revascularization.  Continue risk factor modification.  Has previously seen [facility].  Prefers f/u with cardiology.       Relevant Medications   ezetimibe (ZETIA) 10 MG tablet   amLODipine (NORVASC) 2.5 MG tablet   losartan (COZAAR) 100 MG tablet   Varicosities of leg    Compression hose.       Relevant Medications   ezetimibe (ZETIA) 10 MG tablet   amLODipine (NORVASC) 2.5 MG tablet   losartan (COZAAR) 100 MG tablet   Other Visit Diagnoses     Routine general medical examination at a health care facility    -  Primary        Einar Pheasant, MD

## 2021-12-23 NOTE — Assessment & Plan Note (Signed)
Physical today 12/23/21.  Mammogram 05/17/21 - Birads I.  Colonoscopy 04/2021.

## 2021-12-24 ENCOUNTER — Encounter: Payer: Self-pay | Admitting: Internal Medicine

## 2021-12-24 DIAGNOSIS — G720 Drug-induced myopathy: Secondary | ICD-10-CM | POA: Insufficient documentation

## 2021-12-24 NOTE — Assessment & Plan Note (Signed)
Previously worked up by vascular surgery.  Had screening - read as normal exam.  Described minimal thickening- CCA and bulb bilaterally.  Stated no further w/up warranted.   

## 2021-12-24 NOTE — Assessment & Plan Note (Signed)
Continue amlodipine and losartan.  Blood pressure as outlined.  Follow pressures.  Follow metabolic panel.  

## 2021-12-24 NOTE — Assessment & Plan Note (Signed)
S/p revascularization.  Continue risk factor modification.  Has previously seen [facility].  Prefers f/u with cardiology.

## 2021-12-24 NOTE — Assessment & Plan Note (Signed)
Compression hose.   

## 2021-12-24 NOTE — Assessment & Plan Note (Signed)
The 10-year ASCVD risk score (Arnett DK, et al., 2019) is: 23.5%   Values used to calculate the score:     Age: 76 years     Sex: Female     Is Non-Hispanic African American: No     Diabetic: No     Tobacco smoker: No     Systolic Blood Pressure: 834 mmHg     Is BP treated: Yes     HDL Cholesterol: 57.2 mg/dL     Total Cholesterol: 199 mg/dL  Intolerant to statin medication.  Tolerating zetia three days per week. Discussed other treatment options - repatha.  Wants to hold on starting.  Follow lipid panel.  Low cholesterol diet and exercise.

## 2021-12-24 NOTE — Assessment & Plan Note (Signed)
Low carb diet and exercise.  Follow met b and a1c.   Lab Results  Component Value Date   HGBA1C 5.7 11/16/2021

## 2021-12-24 NOTE — Assessment & Plan Note (Signed)
intolerant to statin medication.  Tolerating zetia three days per week.   Discussed repatha and praluent. She desires to hold on starting one of these, but will let me know if changes her mind.

## 2021-12-24 NOTE — Assessment & Plan Note (Signed)
Had significant muscle aches with statin medication.  Tolerating zetia three days per week. Discussed repatha or praluent.  Will think about starting and let me know if decides.  Discussed importance of risk factor modification.

## 2021-12-24 NOTE — Assessment & Plan Note (Signed)
Colonoscopy 04/2021.  Recommended f/u in 3 years.  

## 2022-03-23 DIAGNOSIS — M170 Bilateral primary osteoarthritis of knee: Secondary | ICD-10-CM | POA: Diagnosis not present

## 2022-04-13 ENCOUNTER — Ambulatory Visit (INDEPENDENT_AMBULATORY_CARE_PROVIDER_SITE_OTHER): Payer: Medicare Other

## 2022-04-13 VITALS — Ht 66.0 in | Wt 151.0 lb

## 2022-04-13 DIAGNOSIS — Z0000 Encounter for general adult medical examination without abnormal findings: Secondary | ICD-10-CM

## 2022-04-13 NOTE — Patient Instructions (Addendum)
Ms. Whitney Carr , Thank you for taking time to come for your Medicare Wellness Visit. I appreciate your ongoing commitment to your health goals. Please review the following plan we discussed and let me know if I can assist you in the future.   These are the goals we discussed:  Goals       Patient Stated     Maintain Healthy Lifestyle (pt-stated)      I would like to maintain current weight      Other     Follow up with Primary Care Provider      As needed        This is a list of the screening recommended for you and due dates:  Health Maintenance  Topic Date Due   COVID-19 Vaccine (4 - Moderna series) 04/29/2022*   Flu Shot  09/24/2022*   Mammogram  05/17/2022   Tetanus Vaccine  02/01/2027   Pneumonia Vaccine  Completed   DEXA scan (bone density measurement)  Completed   Hepatitis C Screening: USPSTF Recommendation to screen - Ages 60-79 yo.  Completed   Zoster (Shingles) Vaccine  Completed   HPV Vaccine  Aged Out   Colon Cancer Screening  Discontinued  *Topic was postponed. The date shown is not the original due date.    Advanced directives: End of life planning; Advance aging; Advanced directives discussed.  Copy of current HCPOA/Living Will requested.    Conditions/risks identified: None new  Next appointment: Follow up in one year for your annual wellness visit   Preventive Care 65 Years and Older, Female Preventive care refers to lifestyle choices and visits with your health care provider that can promote health and wellness. What does preventive care include? A yearly physical exam. This is also called an annual well check. Dental exams once or twice a year. Routine eye exams. Ask your health care provider how often you should have your eyes checked. Personal lifestyle choices, including: Daily care of your teeth and gums. Regular physical activity. Eating a healthy diet. Avoiding tobacco and drug use. Limiting alcohol use. Practicing safe sex. Taking low-dose  aspirin every day. Taking vitamin and mineral supplements as recommended by your health care provider. What happens during an annual well check? The services and screenings done by your health care provider during your annual well check will depend on your age, overall health, lifestyle risk factors, and family history of disease. Counseling  Your health care provider may ask you questions about your: Alcohol use. Tobacco use. Drug use. Emotional well-being. Home and relationship well-being. Sexual activity. Eating habits. History of falls. Memory and ability to understand (cognition). Work and work Statistician. Reproductive health. Screening  You may have the following tests or measurements: Height, weight, and BMI. Blood pressure. Lipid and cholesterol levels. These may be checked every 5 years, or more frequently if you are over 71 years old. Skin check. Lung cancer screening. You may have this screening every year starting at age 24 if you have a 30-pack-year history of smoking and currently smoke or have quit within the past 15 years. Fecal occult blood test (FOBT) of the stool. You may have this test every year starting at age 46. Flexible sigmoidoscopy or colonoscopy. You may have a sigmoidoscopy every 5 years or a colonoscopy every 10 years starting at age 28. Hepatitis C blood test. Hepatitis B blood test. Sexually transmitted disease (STD) testing. Diabetes screening. This is done by checking your blood sugar (glucose) after you have not eaten for  a while (fasting). You may have this done every 1-3 years. Bone density scan. This is done to screen for osteoporosis. You may have this done starting at age 20. Mammogram. This may be done every 1-2 years. Talk to your health care provider about how often you should have regular mammograms. Talk with your health care provider about your test results, treatment options, and if necessary, the need for more tests. Vaccines  Your  health care provider may recommend certain vaccines, such as: Influenza vaccine. This is recommended every year. Tetanus, diphtheria, and acellular pertussis (Tdap, Td) vaccine. You may need a Td booster every 10 years. Zoster vaccine. You may need this after age 60. Pneumococcal 13-valent conjugate (PCV13) vaccine. One dose is recommended after age 23. Pneumococcal polysaccharide (PPSV23) vaccine. One dose is recommended after age 63. Talk to your health care provider about which screenings and vaccines you need and how often you need them. This information is not intended to replace advice given to you by your health care provider. Make sure you discuss any questions you have with your health care provider. Document Released: 07/09/2015 Document Revised: 03/01/2016 Document Reviewed: 04/13/2015 Elsevier Interactive Patient Education  2017 [place] Prevention in the Home Falls can cause injuries. They can happen to people of all ages. There are many things you can do to make your home safe and to help prevent falls. What can I do on the outside of my home? Regularly fix the edges of walkways and driveways and fix any cracks. Remove anything that might make you trip as you walk through a door, such as a raised step or threshold. Trim any bushes or trees on the path to your home. Use bright outdoor lighting. Clear any walking paths of anything that might make someone trip, such as rocks or tools. Regularly check to see if handrails are loose or broken. Make sure that both sides of any steps have handrails. Any raised decks and porches should have guardrails on the edges. Have any leaves, snow, or ice cleared regularly. Use sand or salt on walking paths during winter. Clean up any spills in your garage right away. This includes oil or grease spills. What can I do in the bathroom? Use night lights. Install grab bars by the toilet and in the tub and shower. Do not use towel bars as  grab bars. Use non-skid mats or decals in the tub or shower. If you need to sit down in the shower, use a plastic, non-slip stool. Keep the floor dry. Clean up any water that spills on the floor as soon as it happens. Remove soap buildup in the tub or shower regularly. Attach bath mats securely with double-sided non-slip rug tape. Do not have throw rugs and other things on the floor that can make you trip. What can I do in the bedroom? Use night lights. Make sure that you have a light by your bed that is easy to reach. Do not use any sheets or blankets that are too big for your bed. They should not hang down onto the floor. Have a firm chair that has side arms. You can use this for support while you get dressed. Do not have throw rugs and other things on the floor that can make you trip. What can I do in the kitchen? Clean up any spills right away. Avoid walking on wet floors. Keep items that you use a lot in easy-to-reach places. If you need to reach something above  you, use a strong step stool that has a grab bar. Keep electrical cords out of the way. Do not use floor polish or wax that makes floors slippery. If you must use wax, use non-skid floor wax. Do not have throw rugs and other things on the floor that can make you trip. What can I do with my stairs? Do not leave any items on the stairs. Make sure that there are handrails on both sides of the stairs and use them. Fix handrails that are broken or loose. Make sure that handrails are as long as the stairways. Check any carpeting to make sure that it is firmly attached to the stairs. Fix any carpet that is loose or worn. Avoid having throw rugs at the top or bottom of the stairs. If you do have throw rugs, attach them to the floor with carpet tape. Make sure that you have a light switch at the top of the stairs and the bottom of the stairs. If you do not have them, ask someone to add them for you. What else can I do to help prevent  falls? Wear shoes that: Do not have high heels. Have rubber bottoms. Are comfortable and fit you well. Are closed at the toe. Do not wear sandals. If you use a stepladder: Make sure that it is fully opened. Do not climb a closed stepladder. Make sure that both sides of the stepladder are locked into place. Ask someone to hold it for you, if possible. Clearly mark and make sure that you can see: Any grab bars or handrails. First and last steps. Where the edge of each step is. Use tools that help you move around (mobility aids) if they are needed. These include: Canes. Walkers. Scooters. Crutches. Turn on the lights when you go into a dark area. Replace any light bulbs as soon as they burn out. Set up your furniture so you have a clear path. Avoid moving your furniture around. If any of your floors are uneven, fix them. If there are any pets around you, be aware of where they are. Review your medicines with your doctor. Some medicines can make you feel dizzy. This can increase your chance of falling. Ask your doctor what other things that you can do to help prevent falls. This information is not intended to replace advice given to you by your health care provider. Make sure you discuss any questions you have with your health care provider. Document Released: 04/08/2009 Document Revised: 11/18/2015 Document Reviewed: 07/17/2014 Elsevier Interactive Patient Education  2017 [employer].

## 2022-04-13 NOTE — Progress Notes (Signed)
Subjective:   Whitney Carr is a 76 y.o. female who presents for Medicare Annual (Subsequent) preventive examination.  Review of Systems    No ROS.  Medicare Wellness Virtual Visit.  Visual/audio telehealth visit, UTA vital signs.   See social history for additional risk factors.   Cardiac Risk Factors include: advanced age (>75mn, >>64women)     Objective:    Today's Vitals   04/13/22 1450  Weight: 151 lb (68.5 kg)  Height: '5\' 6"'$  (1.676 m)   Body mass index is 24.37 kg/m.     04/13/2022    2:58 PM 04/28/2021    7:54 AM 04/12/2021    9:14 AM 04/09/2020    9:14 AM 04/09/2019    9:26 AM 04/03/2018    2:06 PM 04/02/2017    2:26 PM  Advanced Directives  Does Patient Have a Medical Advance Directive? Yes Yes Yes Yes Yes Yes Yes  Type of AParamedicof AGastonLiving will  [place]Living will Living will;Healthcare Power of [place]onder[place] will [place]und[place] will Living will;Healthcare Power of Attorney  Does patient want to make changes to medical advance directive? No - Patient declined  No - Patient declined  No - Patient declined No - Patient declined No - Patient declined  Copy of [place] Chart? No - copy requested  No - copy requested No - copy requested No - copy requested No - copy requested No - copy requested    Current Medications (verified) Outpatient Encounter Medications as of 04/13/2022  Medication Sig   amLODipine (NORVASC) 2.5 MG tablet Take 1 tablet (2.5 mg total) by mouth daily.   Calcium 600-200 MG-UNIT per tablet Take 1 tablet by mouth 2 (two) times daily.   cholecalciferol (VITAMIN D) 1000 UNITS tablet Take 1,000 Units by mouth daily.   ezetimibe (ZETIA) 10 MG tablet Take q Monday, Wednesday and Friday.   losartan (COZAAR) 100 MG tablet Take 1 tablet (100 mg total) by mouth daily.   No facility-administered encounter medications  on file as of 04/13/2022.    Allergies (verified) Patient has no known allergies.   History: Past Medical History:  Diagnosis Date   Degenerative joint disease    GERD (gastroesophageal reflux disease)    Hypercholesterolemia    Hypertension    Past Surgical History:  Procedure Laterality Date   APPENDECTOMY  1969   COLONOSCOPY WITH PROPOFOL N/A 04/28/2021   Procedure: COLONOSCOPY WITH PROPOFOL;  Surgeon: RAnnamaria Helling DO;  [facility]  Service: Gastroenterology;  Laterality: N/A;   TONSILECTOMY/ADENOIDECTOMY WITH MYRINGOTOMY  1[place]  secondary to bleeding   Family History  Problem Relation Age of Onset   Congestive Heart Failure Mother    Thyroid disease Mother    Heart failure Father    Diabetes Father    Heart disease Sister        CABG   Alzheimer's disease Sister    Diabetes Sister    Diabetes Sister    Stroke Brother    COPD Brother    COPD Brother    Colon cancer Brother    Hypertension Brother    Heart disease Brother    Breast cancer Daughter    Autoimmune disease Daughter    Social History   Socioeconomic History   Marital status: Married    Spouse name: Not on file  Number of children: 3   Years of education: Not on file   Highest education level: Not on file  Occupational History   Not on file  Tobacco Use   Smoking status: Never   Smokeless tobacco: Never  Vaping Use   Vaping Use: Never used  Substance and Sexual Activity   Alcohol use: No    Alcohol/week: 0.0 standard drinks of alcohol   Drug use: No   Sexual activity: Yes  Other Topics Concern   Not on file  Social History Narrative   Not on file   Social Determinants of Health   Financial Resource Strain: Low Risk  (04/13/2022)   Overall Financial Resource Strain (CARDIA)    Difficulty of Paying Living Expenses: Not hard at all  Food Insecurity: No Food Insecurity (04/13/2022)   Hunger Vital Sign     Worried About Running Out of Food in the Last Year: Never true    Ran Out of Food in the Last Year: Never true  Transportation Needs: No Transportation Needs (04/13/2022)   PRAPARE - Hydrologist (Medical): No    Lack of Transportation (Non-Medical): No  Physical Activity: Sufficiently Active (04/13/2022)   Exercise Vital Sign    Days of Exercise per Week: 5 days    Minutes of Exercise per Session: 30 min  Stress: No Stress Concern Present (04/13/2022)   [place]    Feeling of Stress : Not at all  Social Connections: Unknown (04/13/2022)   Social Connection and Isolation Panel [NHANES]    Frequency of Communication with Friends and Family: Not on file    Frequency of Social Gatherings with Friends and Family: Not on file    Attends Religious Services: Not on file    Active Member of Clubs or Organizations: Not on file    Attends Archivist Meetings: Not on file    Marital Status: Married    Tobacco Counseling Counseling given: Not Answered   Clinical Intake:  Pre-visit preparation completed: Yes        Diabetes: No  How often do you need to have someone help you when you read instructions, pamphlets, or other written materials from your doctor or pharmacy?: 1 - Never   Interpreter Needed?: No    Activities of Daily Living    04/13/2022    2:53 PM  In your present state of health, do you have any difficulty performing the following activities:  Hearing? 0  Vision? 0  Difficulty concentrating or making decisions? 0  Walking or climbing stairs? 0  Comment Paces self.  Dressing or bathing? 0  Doing errands, shopping? 0  Preparing Food and eating ? N  Using the Toilet? N  In the past six months, have you accidently leaked urine? N  Do you have problems with loss of bowel control? N  Managing your Medications? N  Managing your Finances? N  Housekeeping or  managing your Housekeeping? N    Patient Care Team: Einar Pheasant, MD as PCP - General (Internal Medicine)  Indicate any recent Medical Services you may have received from other than [facility] providers in the past year (date may be approximate).     Assessment:   This is a routine wellness examination for [facility].  I connected with  Whitney Carr on 04/13/22 by a audio enabled telemedicine application and verified that I am speaking with the correct person using two identifiers.  Patient Location:  Home  Provider Location: Office/Clinic  I discussed the limitations of evaluation and management by telemedicine. The patient expressed understanding and agreed to proceed.   Hearing/Vision screen Hearing Screening - Comments:: Patient is able to hear conversational tones without difficulty. No issues reported. Vision Screening - Comments:: Followed by [facility]  Wears corrective lenses when reading Cataract extraction, R eye only They have regular follow up with the ophthalmologist  Dietary issues and exercise activities discussed: Current Exercise Habits: Home exercise routine, Time (Minutes): 30, Frequency (Times/Week): 5, Weekly Exercise (Minutes/Week): 150, Intensity: Mild Healthy diet Good water intake   Goals Addressed               This Visit's Progress     Patient Stated     Maintain Healthy Lifestyle (pt-stated)   On track     I would like to maintain current weight       Depression Screen    04/13/2022    2:52 PM 12/23/2021    2:59 PM 04/12/2021    9:23 AM 04/09/2020    9:13 AM 04/09/2019    9:14 AM 04/03/2018    2:10 PM 04/02/2017    2:25 PM  PHQ 2/9 Scores  PHQ - 2 Score 0 0 0 0 0 0 0    Fall Risk    04/13/2022    2:56 PM 12/23/2021    2:59 PM 04/12/2021    9:36 AM 04/09/2020    9:30 AM 04/09/2019    9:14 AM  [place] in the past year? 0 0 0 0 0  Number falls in past yr: 0 0 0 0   Injury with Fall? 0 0 0    Risk for fall due to  : No Fall Risks No Fall Risks     Follow up Falls evaluation completed Falls evaluation completed Falls evaluation completed Falls evaluation completed     [place]: Home free of loose throw rugs in walkways, pet beds, electrical cords, etc? Yes  Adequate lighting in your home to reduce risk of falls? Yes   ASSISTIVE DEVICES UTILIZED TO PREVENT FALLS: Life alert? No  Use of a cane, walker or w/c? No  Grab bars in the bathroom? Yes  Shower chair or bench in shower? No  Elevated toilet seat or a handicapped toilet? No   TIMED UP AND GO: Was the test performed? No .   Cognitive Function:    04/03/2018    2:15 PM 04/02/2017    2:28 PM  MMSE - Mini Mental State Exam  Orientation to time 5 5  Orientation to Place 5 5  Registration 3 3  Attention/ Calculation 5 5  Recall 3 3  Language- name 2 objects 2 2  Language- repeat 1 1  Language- follow 3 step command 3 3  Language- read & follow direction 1 1  Write a sentence 1 1  Copy design 1 1  Total score 30 30        04/13/2022    2:56 PM 04/12/2021    9:20 AM 04/09/2020    9:31 AM 04/09/2019    9:21 AM  6CIT Screen  What Year? 0 points 0 points 0 points 0 points  What month? 0 points 0 points 0 points 0 points  What time? 0 points 0 points 0 points 0 points  Count back from 20 0 points 0 points  0 points  Months in reverse 0 points 0 points  0 points  Repeat phrase 0 points 0 points  0 points  Total Score 0 points 0 points  0 points    Immunizations Immunization History  Administered Date(s) Administered   Fluad Quad(high Dose 65+) 04/10/2019, 03/15/2021   Influenza Split 05/06/2012   Influenza, High Dose Seasonal PF 03/23/2015, 03/01/2016, 04/02/2017, 03/01/2018   Influenza,inj,Quad PF,6+ Mos 03/09/2014   Influenza-Unspecified 02/29/2016   Moderna Sars-Covid-2 Vaccination 07/28/2019, 08/26/2019, 04/24/2020   Pneumococcal Conjugate-13 01/20/2014   Pneumococcal Polysaccharide-23  05/08/2012   Tdap 01/31/2017   Zoster Recombinat (Shingrix) 01/31/2017, 05/15/2017   Flu Vaccine status: Due, Education has been provided regarding the importance of this vaccine. Advised may receive this vaccine at local pharmacy or Health Dept. Aware to provide a copy of the vaccination record if obtained from local pharmacy or Health Dept. Verbalized acceptance and understanding.  Covid-19 vaccine status: Completed vaccines x3.  Screening Tests Health Maintenance  Topic Date Due   COVID-19 Vaccine (4 - Moderna series) 04/29/2022 (Originally 06/19/2020)   INFLUENZA VACCINE  09/24/2022 (Originally 01/24/2022)   MAMMOGRAM  05/17/2022   TETANUS/TDAP  02/01/2027   Pneumonia Vaccine 61+ Years old  Completed   DEXA SCAN  Completed   Hepatitis C Screening  Completed   Zoster Vaccines- Shingrix  Completed   HPV VACCINES  Aged Out   COLONOSCOPY (Pts 45-57yr Insurance coverage will need to be confirmed)  Discontinued    Health Maintenance There are no preventive care reminders to display for this patient.  Lung Cancer Screening: (Low Dose CT Chest recommended if Age 76-80years, 30 pack-year currently smoking OR have quit w/in 15years.) does not qualify.   Hepatitis C Screening: Completed 2018.  Vision Screening: Recommended annual ophthalmology exams for early detection of glaucoma and other disorders of the eye.  Dental Screening: Recommended annual dental exams for proper oral hygiene  Community Resource Referral / Chronic Care Management: CRR required this visit?  No   CCM required this visit?  No      Plan:     I have personally reviewed and noted the following in the patient's chart:   Medical and social history Use of alcohol, tobacco or illicit drugs  Current medications and supplements including opioid prescriptions. Patient is not currently taking opioid prescriptions. Functional ability and status Nutritional status Physical activity Advanced directives List of  other physicians Hospitalizations, surgeries, and ER visits in previous 12 months Vitals Screenings to include cognitive, depression, and falls Referrals and appointments  In addition, I have reviewed and discussed with patient certain preventive protocols, quality metrics, and best practice recommendations. A written personalized care plan for preventive services as well as general preventive health recommendations were provided to patient.     DLeta Jungling LPN   [id number]

## 2022-04-20 ENCOUNTER — Other Ambulatory Visit (INDEPENDENT_AMBULATORY_CARE_PROVIDER_SITE_OTHER): Payer: Medicare Other

## 2022-04-20 DIAGNOSIS — R739 Hyperglycemia, unspecified: Secondary | ICD-10-CM | POA: Diagnosis not present

## 2022-04-20 DIAGNOSIS — E7800 Pure hypercholesterolemia, unspecified: Secondary | ICD-10-CM | POA: Diagnosis not present

## 2022-04-20 DIAGNOSIS — I10 Essential (primary) hypertension: Secondary | ICD-10-CM

## 2022-04-20 LAB — HEPATIC FUNCTION PANEL
ALT: 8 U/L (ref 0–35)
AST: 12 U/L (ref 0–37)
Albumin: 4.1 g/dL (ref 3.5–5.2)
Alkaline Phosphatase: 50 U/L (ref 39–117)
Bilirubin, Direct: 0.1 mg/dL (ref 0.0–0.3)
Total Bilirubin: 0.5 mg/dL (ref 0.2–1.2)
Total Protein: 6.4 g/dL (ref 6.0–8.3)

## 2022-04-20 LAB — BASIC METABOLIC PANEL
BUN: 14 mg/dL (ref 6–23)
CO2: 28 mEq/L (ref 19–32)
Calcium: 9.5 mg/dL (ref 8.4–10.5)
Chloride: 103 mEq/L (ref 96–112)
Creatinine, Ser: 0.69 mg/dL (ref 0.40–1.20)
GFR: 84.42 mL/min (ref >60.00)
Glucose, Bld: 92 mg/dL (ref 70–99)
Potassium: 4 mEq/L (ref 3.5–5.1)
Sodium: 140 mEq/L (ref 135–145)

## 2022-04-20 LAB — HEMOGLOBIN A1C: Hgb A1c MFr Bld: 6 % (ref 4.6–6.5)

## 2022-04-20 LAB — LIPID PANEL
Cholesterol: 177 mg/dL (ref 0–200)
HDL: 54.8 mg/dL (ref >39.00)
LDL Cholesterol: 101 mg/dL — ABNORMAL HIGH (ref 0–99)
NonHDL: 121.75
Total CHOL/HDL Ratio: 3
Triglycerides: 105 mg/dL (ref 0.0–149.0)
VLDL: 21 mg/dL (ref 0.0–40.0)

## 2022-04-20 LAB — TSH: TSH: 1.19 u[IU]/mL (ref 0.35–5.50)

## 2022-04-25 ENCOUNTER — Ambulatory Visit (INDEPENDENT_AMBULATORY_CARE_PROVIDER_SITE_OTHER): Payer: Medicare Other | Admitting: Internal Medicine

## 2022-04-25 ENCOUNTER — Encounter: Payer: Self-pay | Admitting: Internal Medicine

## 2022-04-25 ENCOUNTER — Telehealth: Payer: Self-pay

## 2022-04-25 VITALS — BP 132/72 | HR 78 | Temp 98.4°F | Resp 17 | Ht 66.0 in | Wt 154.8 lb

## 2022-04-25 DIAGNOSIS — M170 Bilateral primary osteoarthritis of knee: Secondary | ICD-10-CM

## 2022-04-25 DIAGNOSIS — R0989 Other specified symptoms and signs involving the circulatory and respiratory systems: Secondary | ICD-10-CM

## 2022-04-25 DIAGNOSIS — I700 Atherosclerosis of aorta: Secondary | ICD-10-CM | POA: Diagnosis not present

## 2022-04-25 DIAGNOSIS — Z860100 Personal history of colon polyps, unspecified: Secondary | ICD-10-CM

## 2022-04-25 DIAGNOSIS — E7800 Pure hypercholesterolemia, unspecified: Secondary | ICD-10-CM | POA: Diagnosis not present

## 2022-04-25 DIAGNOSIS — T466X5A Adverse effect of antihyperlipidemic and antiarteriosclerotic drugs, initial encounter: Secondary | ICD-10-CM | POA: Diagnosis not present

## 2022-04-25 DIAGNOSIS — Z23 Encounter for immunization: Secondary | ICD-10-CM | POA: Diagnosis not present

## 2022-04-25 DIAGNOSIS — R739 Hyperglycemia, unspecified: Secondary | ICD-10-CM

## 2022-04-25 DIAGNOSIS — I10 Essential (primary) hypertension: Secondary | ICD-10-CM

## 2022-04-25 DIAGNOSIS — G458 Other transient cerebral ischemic attacks and related syndromes: Secondary | ICD-10-CM

## 2022-04-25 DIAGNOSIS — Z1231 Encounter for screening mammogram for malignant neoplasm of breast: Secondary | ICD-10-CM | POA: Diagnosis not present

## 2022-04-25 DIAGNOSIS — G720 Drug-induced myopathy: Secondary | ICD-10-CM | POA: Diagnosis not present

## 2022-04-25 MED ORDER — EZETIMIBE 10 MG PO TABS: ORAL_TABLET | ORAL | 3 refills | Status: DC

## 2022-04-25 MED ORDER — REPATHA SURECLICK 140 MG/ML ~~LOC~~ SOAJ: 140.0000 mg | SUBCUTANEOUS | 3 refills | Status: DC

## 2022-04-25 MED ORDER — TELMISARTAN 80 MG PO TABS: 80.0000 mg | ORAL_TABLET | Freq: Every day | ORAL | 2 refills | Status: DC

## 2022-04-25 NOTE — Telephone Encounter (Signed)
Pt advised mammo sched for   12/5 at [place]

## 2022-04-25 NOTE — Progress Notes (Signed)
Patient ID: Whitney Carr, female   DOB: 03-29-1946, 76 y.o.   MRN: [id number]   Subjective:    Patient ID: Whitney Carr, female    DOB: 1946-01-13, 76 y.o.   MRN: [id number]   Patient here for  Chief Complaint  Patient presents with   Follow-up   Hypertension   Hyperlipidemia   .   HPI Here to follow up regarding her cholesterol and blood pressure.  Taking zetia - three days per week.  Intolerant to statin medication.  Discussed labs. Discussed repatha for better cholesterol control.  Stays active. No chest pain or sob reported.  No  abdominal pain or bowel change reported.  Overall feels she is doing relatively well.  Does have some joint pains - thumb,elbow and also third finger (trigger).  Discussed further w/up and evaluation.  Discussed possible injection - trigger finger.     Past Medical History:  Diagnosis Date   Degenerative joint disease    GERD (gastroesophageal reflux disease)    Hypercholesterolemia    Hypertension    Past Surgical History:  Procedure Laterality Date   APPENDECTOMY  1969   COLONOSCOPY WITH PROPOFOL N/A 04/28/2021   Procedure: COLONOSCOPY WITH PROPOFOL;  Surgeon: Annamaria Helling, DO;  Location: [facility] ENDOSCOPY;  Service: Gastroenterology;  Laterality: N/A;   TONSILECTOMY/ADENOIDECTOMY WITH MYRINGOTOMY  [place]   secondary to bleeding   Family History  Problem Relation Age of Onset   Congestive Heart Failure Mother    Thyroid disease Mother    Heart failure Father    Diabetes Father    Heart disease Sister        CABG   Alzheimer's disease Sister    Diabetes Sister    Diabetes Sister    Stroke Brother    COPD Brother    COPD Brother    Colon cancer Brother    Hypertension Brother    Heart disease Brother    Breast cancer Daughter    Autoimmune disease Daughter    Social History   Socioeconomic History   Marital status: Married    Spouse name: Not on file   Number of  children: 3   Years of education: Not on file   Highest education level: Not on file  Occupational History   Not on file  Tobacco Use   Smoking status: Never   Smokeless tobacco: Never  Vaping Use   Vaping Use: Never used  Substance and Sexual Activity   Alcohol use: No    Alcohol/week: 0.0 standard drinks of alcohol   Drug use: No   Sexual activity: Yes  Other Topics Concern   Not on file  Social History Narrative   Not on file   Social Determinants of Health   Financial Resource Strain: Low Risk  (04/13/2022)   Overall Financial Resource Strain (CARDIA)    Difficulty of Paying Living Expenses: Not hard at all  Food Insecurity: No Food Insecurity (04/13/2022)   Hunger Vital Sign    Worried About Running Out of Food in the Last Year: Never true    Ran Out of Food in the Last Year: Never true  Transportation Needs: No Transportation Needs (04/13/2022)   PRAPARE - Hydrologist (Medical): No    Lack of Transportation (Non-Medical): No  Physical Activity: Sufficiently Active (04/13/2022)   Exercise Vital Sign    Days of Exercise per Week: 5 days  Minutes of Exercise per Session: 30 min  Stress: No Stress Concern Present (04/13/2022)   [place]    Feeling of Stress : Not at all  Social Connections: Unknown (04/13/2022)   Social Connection and Isolation Panel [NHANES]    Frequency of Communication with Friends and Family: Not on file    Frequency of Social Gatherings with Friends and Family: Not on file    Attends Religious Services: Not on file    Active Member of Clubs or Organizations: Not on file    Attends Archivist Meetings: Not on file    Marital Status: Married     Review of Systems  Constitutional:  Negative for appetite change and unexpected weight change.  HENT:  Negative for congestion and sinus pressure.   Respiratory:  Negative for cough, chest  tightness and shortness of breath.   Cardiovascular:  Negative for chest pain, palpitations and leg swelling.  Gastrointestinal:  Negative for abdominal pain, diarrhea, nausea and vomiting.  Genitourinary:  Negative for difficulty urinating and dysuria.  Musculoskeletal:  Negative for myalgias.       Knee pain.   Skin:  Negative for color change and rash.  Neurological:  Negative for dizziness, light-headedness and headaches.  Psychiatric/Behavioral:  Negative for agitation and dysphoric mood.        Objective:     BP 132/72 (BP Location: Left Arm, Patient Position: Sitting, Cuff Size: Small)   Pulse 78   Temp 98.4 F (36.9 C) (Temporal)   Resp 17   Ht _0  (1.676 m)   Wt 154 lb 12.8 oz (70.2 kg)   LMP 06/03/1986   SpO2 98%   BMI 24.99 kg/m  Wt Readings from Last 3 Encounters:  04/25/22 154 lb 12.8 oz (70.2 kg)  04/13/22 151 lb (68.5 kg)  12/23/21 151 lb 12.8 oz (68.9 kg)    Physical Exam Constitutional:      General: She is not in acute distress.    Appearance: Normal appearance.  HENT:     Head: Normocephalic and atraumatic.     Right Ear: External ear normal.     Left Ear: External ear normal.     Nose: Nose normal.     Mouth/Throat:     Pharynx: No oropharyngeal exudate or posterior oropharyngeal erythema.  Neck:     Thyroid: No thyromegaly.  Cardiovascular:     Rate and Rhythm: Normal rate and regular rhythm.  Pulmonary:     Effort: No respiratory distress.     Breath sounds: Normal breath sounds. No wheezing.  Abdominal:     General: Bowel sounds are normal.     Palpations: Abdomen is soft.     Tenderness: There is no abdominal tenderness.  Musculoskeletal:        General: No tenderness.     Cervical back: Neck supple.     Comments: No increased swelling   Lymphadenopathy:     Cervical: No cervical adenopathy.  Skin:    Findings: No erythema or rash.  Neurological:     Mental Status: She is alert.  Psychiatric:        Mood and Affect: Mood  normal.        Behavior: Behavior normal.      Outpatient Encounter Medications as of 04/25/2022  Medication Sig   Calcium 600-200 MG-UNIT per tablet Take 1 tablet by mouth 2 (two) times daily.   cholecalciferol (VITAMIN D) 1000 UNITS tablet Take 1,000  Units by mouth daily.   Evolocumab (REPATHA SURECLICK) 540 MG/ML SOAJ Inject 140 mg into the skin every 14 (fourteen) days.   telmisartan (MICARDIS) 80 MG tablet Take 1 tablet (80 mg total) by mouth daily.   [DISCONTINUED] ezetimibe (ZETIA) 10 MG tablet Take q Monday, Wednesday and Friday.   [DISCONTINUED] losartan (COZAAR) 100 MG tablet Take 1 tablet (100 mg total) by mouth daily.   ezetimibe (ZETIA) 10 MG tablet Take q Monday, Wednesday and Friday.   [DISCONTINUED] amLODipine (NORVASC) 2.5 MG tablet Take 1 tablet (2.5 mg total) by mouth daily.   No facility-administered encounter medications on file as of 04/25/2022.     Lab Results  Component Value Date   WBC 5.6 07/13/2021   HGB 13.8 07/13/2021   HCT 42.3 07/13/2021   PLT 291.0 07/13/2021   GLUCOSE 92 04/20/2022   CHOL 177 04/20/2022   TRIG 105.0 04/20/2022   HDL 54.80 04/20/2022   LDLDIRECT 143.6 05/13/2012   LDLCALC 101 (H) 04/20/2022   ALT 8 04/20/2022   AST 12 04/20/2022   NA 140 04/20/2022   K 4.0 04/20/2022   CL 103 04/20/2022   CREATININE 0.69 04/20/2022   BUN 14 04/20/2022   CO2 28 04/20/2022   TSH 1.19 04/20/2022   HGBA1C 6.0 04/20/2022    MM 3D SCREEN BREAST BILATERAL  Result Date: 05/17/2021 CLINICAL DATA:  Screening. EXAM: DIGITAL SCREENING BILATERAL MAMMOGRAM WITH TOMOSYNTHESIS AND CAD TECHNIQUE: Bilateral screening digital craniocaudal and mediolateral oblique mammograms were obtained. Bilateral screening digital breast tomosynthesis was performed. The images were evaluated with computer-aided detection. COMPARISON:  Previous exam(s). ACR Breast Density Category b: There are scattered areas of fibroglandular density. FINDINGS: There are no findings  suspicious for malignancy. IMPRESSION: No mammographic evidence of malignancy. A result letter of this screening mammogram will be mailed directly to the patient. RECOMMENDATION: Screening mammogram in one year. (Code:SM-B-01Y) BI-RADS CATEGORY  1: Negative. Electronically Signed   By: Abelardo Diesel M.D.   On: 05/17/2021 10:50      Assessment & Plan:   Problem List Items Addressed This Visit     Aortic atherosclerosis ([place])    intolerant to statin medication.  Tolerating zetia three days per week.   Discussed repatha and praluent. She is agreeable to start repatha.        Relevant Medications   ezetimibe (ZETIA) 10 MG tablet   telmisartan (MICARDIS) 80 MG tablet   Evolocumab (REPATHA SURECLICK) 086 MG/ML SOAJ   Carotid bruit    Previously worked up by vascular surgery.  Had screening - read as normal exam.  Described minimal thickening- CCA and bulb bilaterally.  Stated no further w/up warranted.        History of colonic polyps    Colonoscopy 04/2021.  Recommended f/u in 3 years.       Hypercholesteremia    The 10-year ASCVD risk score (Arnett DK, et al., 2019) is: 24.4%   Values used to calculate the score:     Age: 49 years     Sex: Female     Is Non-Hispanic African American: No     Diabetic: No     Tobacco smoker: No     Systolic Blood Pressure: 761 mmHg     Is BP treated: Yes     HDL Cholesterol: 54.8 mg/dL     Total Cholesterol: 177 mg/dL  Intolerant to statin medication.  Tolerating zetia three days per week. Discussed other treatment options - repatha.  Agreeable to start repatha.  Follow lipid panel.  Low cholesterol diet and exercise.        Relevant Medications   ezetimibe (ZETIA) 10 MG tablet   telmisartan (MICARDIS) 80 MG tablet   Evolocumab (REPATHA SURECLICK) 847 MG/ML SOAJ   Other Relevant Orders   Lipid Profile   Hepatic function panel   Hyperglycemia    Low carb diet and exercise.  Follow met b and a1c.   Lab Results  Component Value Date   HGBA1C  6.0 04/20/2022       Relevant Orders   HgB A1c   Hypertension    Is not taking amlodipine.  On losartan 175m q day.  Change to micardis 841mq day.  Follow pressures.  Follow metabolic panel.       Relevant Medications   ezetimibe (ZETIA) 10 MG tablet   telmisartan (MICARDIS) 80 MG tablet   Evolocumab (REPATHA SURECLICK) 14308G/ML SOAJ   Other Relevant Orders   Basic Metabolic Panel (BMET)   CBC w/Diff   Knee osteoarthritis    Seeing [facility].  Evaluated 03/24/22 - bilateral knee injections.       Statin myopathy    Had significant muscle aches with statin medication.  Tolerating zetia three days per week. Discussed repatha or praluent.  Agreeable to start repatha.       Subclavian steal syndrome    S/p revascularization.  Continue risk factor modification.  Has previously seen [facility].  Prefers f/u with cardiology. Has seen Dr FaUbaldo Glassing      Relevant Medications   ezetimibe (ZETIA) 10 MG tablet   telmisartan (MICARDIS) 80 MG tablet   Evolocumab (REPATHA SURECLICK) 14569G/ML SOAJ   Other Visit Diagnoses     Encounter for screening mammogram for malignant neoplasm of breast    -  Primary   Relevant Orders   MM 3D SCREEN BREAST BILATERAL   Need for influenza vaccination       Relevant Orders   Flu Vaccine QUAD High Dose(Fluad) (Completed)        ChEinar PheasantMD

## 2022-04-25 NOTE — Patient Instructions (Signed)
Stop losartan and start micardis

## 2022-04-27 ENCOUNTER — Telehealth: Payer: Self-pay

## 2022-04-27 NOTE — Telephone Encounter (Signed)
Whitney Carr (Whitney Carr) Rx #: [id number] Repatha SureClick '140MG'$ /ML auto-injectors   Form [employer] Medicare Part D Electronic Prior Authorization Form (2017 NCPDP) Created 2 days ago Sent to Plan 1 minute ago Determination Wait for Questions [employer] 2017 NCPDP typically responds with questions in less than 15 minutes, but may take up to 24 hours.

## 2022-05-01 ENCOUNTER — Encounter: Payer: Self-pay | Admitting: Internal Medicine

## 2022-05-01 NOTE — Assessment & Plan Note (Signed)
Is not taking amlodipine.  On losartan '100mg'$  q day.  Change to micardis '80mg'$  q day.  Follow pressures.  Follow metabolic panel.

## 2022-05-01 NOTE — Assessment & Plan Note (Addendum)
S/p revascularization.  Continue risk factor modification.  Has previously seen [facility].  Prefers f/u with cardiology. Has seen Dr Ubaldo Glassing.

## 2022-05-01 NOTE — Assessment & Plan Note (Signed)
Low carb diet and exercise.  Follow met b and a1c.   Lab Results  Component Value Date   HGBA1C 6.0 04/20/2022

## 2022-05-01 NOTE — Assessment & Plan Note (Signed)
Colonoscopy 04/2021.  Recommended f/u in 3 years.

## 2022-05-01 NOTE — Assessment & Plan Note (Signed)
intolerant to statin medication.  Tolerating zetia three days per week.   Discussed repatha and praluent. She is agreeable to start repatha.

## 2022-05-01 NOTE — Assessment & Plan Note (Signed)
Had significant muscle aches with statin medication.  Tolerating zetia three days per week. Discussed repatha or praluent.  Agreeable to start repatha.

## 2022-05-01 NOTE — Assessment & Plan Note (Signed)
The 10-year ASCVD risk score (Arnett DK, et al., 2019) is: 24.4%   Values used to calculate the score:     Age: 76 years     Sex: Female     Is Non-Hispanic African American: No     Diabetic: No     Tobacco smoker: No     Systolic Blood Pressure: 397 mmHg     Is BP treated: Yes     HDL Cholesterol: 54.8 mg/dL     Total Cholesterol: 177 mg/dL  Intolerant to statin medication.  Tolerating zetia three days per week. Discussed other treatment options - repatha.  Agreeable to start repatha.  Follow lipid panel.  Low cholesterol diet and exercise.

## 2022-05-01 NOTE — Assessment & Plan Note (Signed)
Previously worked up by vascular surgery.  Had screening - read as normal exam.  Described minimal thickening- CCA and bulb bilaterally.  Stated no further w/up warranted.

## 2022-05-01 NOTE — Assessment & Plan Note (Signed)
Seeing [facility].  Evaluated 03/24/22 - bilateral knee injections.

## 2022-05-03 NOTE — Telephone Encounter (Signed)
Whitney Carr (Key: [id number]) Repatha SureClick '140MG'$ /ML auto-injectors   Form [employer] Medicare Part D Electronic Prior Authorization Form (2017 NCPDP) Created 37 minutes ago Sent to Plan 36 minutes ago Plan Response 34 minutes ago Submit Clinical Questions 25 minutes ago Determination Favorable 3 minutes ago Message from Plan Request Reference Number: [id number]. REPATHA SURE INJ '140MG'$ /ML is approved through 11/01/2022. Your patient may now fill this prescription and it will be covered.

## 2022-05-30 ENCOUNTER — Ambulatory Visit
Admission: RE | Admit: 2022-05-30 | Discharge: 2022-05-30 | Disposition: A | Discharge status: Home / Self Care | Payer: Medicare Other | Source: Ambulatory Visit | Attending: Internal Medicine | Admitting: Internal Medicine

## 2022-05-30 DIAGNOSIS — M9906 Segmental and somatic dysfunction of lower extremity: Secondary | ICD-10-CM | POA: Diagnosis not present

## 2022-05-30 DIAGNOSIS — M546 Pain in thoracic spine: Secondary | ICD-10-CM | POA: Diagnosis not present

## 2022-05-30 DIAGNOSIS — M9901 Segmental and somatic dysfunction of cervical region: Secondary | ICD-10-CM | POA: Diagnosis not present

## 2022-05-30 DIAGNOSIS — Z1231 Encounter for screening mammogram for malignant neoplasm of breast: Secondary | ICD-10-CM | POA: Diagnosis not present

## 2022-05-30 DIAGNOSIS — M9902 Segmental and somatic dysfunction of thoracic region: Secondary | ICD-10-CM | POA: Diagnosis not present

## 2022-05-30 DIAGNOSIS — M25561 Pain in right knee: Secondary | ICD-10-CM | POA: Diagnosis not present

## 2022-05-30 DIAGNOSIS — M542 Cervicalgia: Secondary | ICD-10-CM | POA: Diagnosis not present

## 2022-05-30 DIAGNOSIS — M9903 Segmental and somatic dysfunction of lumbar region: Secondary | ICD-10-CM | POA: Diagnosis not present

## 2022-05-30 DIAGNOSIS — M5137 Other intervertebral disc degeneration, lumbosacral region: Secondary | ICD-10-CM | POA: Diagnosis not present

## 2022-05-30 DIAGNOSIS — M9904 Segmental and somatic dysfunction of sacral region: Secondary | ICD-10-CM | POA: Diagnosis not present

## 2022-05-30 DIAGNOSIS — M25562 Pain in left knee: Secondary | ICD-10-CM | POA: Diagnosis not present

## 2022-06-05 ENCOUNTER — Other Ambulatory Visit: Payer: Self-pay | Admitting: Internal Medicine

## 2022-06-06 ENCOUNTER — Encounter: Payer: Self-pay | Admitting: Internal Medicine

## 2022-06-06 ENCOUNTER — Ambulatory Visit (INDEPENDENT_AMBULATORY_CARE_PROVIDER_SITE_OTHER): Payer: Medicare Other | Admitting: Internal Medicine

## 2022-06-06 ENCOUNTER — Ambulatory Visit (INDEPENDENT_AMBULATORY_CARE_PROVIDER_SITE_OTHER): Payer: Medicare Other

## 2022-06-06 VITALS — BP 140/72 | HR 55 | Temp 98.0°F | Resp 17 | Ht 66.0 in | Wt 154.2 lb

## 2022-06-06 DIAGNOSIS — I10 Essential (primary) hypertension: Secondary | ICD-10-CM | POA: Diagnosis not present

## 2022-06-06 DIAGNOSIS — R739 Hyperglycemia, unspecified: Secondary | ICD-10-CM | POA: Diagnosis not present

## 2022-06-06 DIAGNOSIS — G458 Other transient cerebral ischemic attacks and related syndromes: Secondary | ICD-10-CM

## 2022-06-06 DIAGNOSIS — I700 Atherosclerosis of aorta: Secondary | ICD-10-CM

## 2022-06-06 DIAGNOSIS — T466X5A Adverse effect of antihyperlipidemic and antiarteriosclerotic drugs, initial encounter: Secondary | ICD-10-CM

## 2022-06-06 DIAGNOSIS — G720 Drug-induced myopathy: Secondary | ICD-10-CM | POA: Diagnosis not present

## 2022-06-06 DIAGNOSIS — M542 Cervicalgia: Secondary | ICD-10-CM

## 2022-06-06 DIAGNOSIS — E7800 Pure hypercholesterolemia, unspecified: Secondary | ICD-10-CM | POA: Diagnosis not present

## 2022-06-06 DIAGNOSIS — Z86010 Personal history of colonic polyps: Secondary | ICD-10-CM | POA: Diagnosis not present

## 2022-06-06 NOTE — Progress Notes (Signed)
Patient ID: Whitney Carr, female   DOB: 12/18/1945, 76 y.o.   MRN: [id number]   Subjective:    Patient ID: Whitney Carr, female    DOB: [birth year], 76 y.o.   MRN: [id number]   Patient here for  Chief Complaint  Patient presents with   Hypertension   Hyperlipidemia   .   HPI Here to follow up regarding her cholesterol and blood pressure.  Was on zetia - three days per week.  Intolerant to statin medication.  Has not started repatha.  Stays active. No chest pain or sob reported.  No  abdominal pain or bowel change reported.  Overall feels she is doing relatively well.  Does have some joint pains.  Main complaint - knees.  Limits activity.  Losartan changed to micardis last visit.  Did not tolerate - blood pressure higher.     Past Medical History:  Diagnosis Date   Degenerative joint disease    GERD (gastroesophageal reflux disease)    Hypercholesterolemia    Hypertension    Past Surgical History:  Procedure Laterality Date   APPENDECTOMY  1969   COLONOSCOPY WITH PROPOFOL N/A 04/28/2021   Procedure: COLONOSCOPY WITH PROPOFOL;  Surgeon: Annamaria Helling, DO;  Location: [facility] ENDOSCOPY;  Service: Gastroenterology;  Laterality: N/A;   TONSILECTOMY/ADENOIDECTOMY WITH MYRINGOTOMY  [place]   secondary to bleeding   Family History  Problem Relation Age of Onset   Congestive Heart Failure Mother    Thyroid disease Mother    Heart failure Father    Diabetes Father    Heart disease Sister        CABG   Alzheimer's disease Sister    Diabetes Sister    Diabetes Sister    Stroke Brother    COPD Brother    COPD Brother    Colon cancer Brother    Hypertension Brother    Heart disease Brother    Breast cancer Daughter    Autoimmune disease Daughter    Social History   Socioeconomic History   Marital status: Married    Spouse name: Not on file   Number of children: 3   Years of education: Not on file   Highest  education level: Not on file  Occupational History   Not on file  Tobacco Use   Smoking status: Never   Smokeless tobacco: Never  Vaping Use   Vaping Use: Never used  Substance and Sexual Activity   Alcohol use: No    Alcohol/week: 0.0 standard drinks of alcohol   Drug use: No   Sexual activity: Yes  Other Topics Concern   Not on file  Social History Narrative   Not on file   Social Determinants of Health   Financial Resource Strain: Low Risk  (04/13/2022)   Overall Financial Resource Strain (CARDIA)    Difficulty of Paying Living Expenses: Not hard at all  Food Insecurity: No Food Insecurity (04/13/2022)   Hunger Vital Sign    Worried About Running Out of Food in the Last Year: Never true    Ran Out of Food in the Last Year: Never true  Transportation Needs: No Transportation Needs (04/13/2022)   PRAPARE - Hydrologist (Medical): No    Lack of Transportation (Non-Medical): No  Physical Activity: Sufficiently Active (04/13/2022)   Exercise Vital Sign    Days of Exercise per Week: 5 days    Minutes  of Exercise per Session: 30 min  Stress: No Stress Concern Present (04/13/2022)   [place]    Feeling of Stress : Not at all  Social Connections: Unknown (04/13/2022)   Social Connection and Isolation Panel [NHANES]    Frequency of Communication with Friends and Family: Not on file    Frequency of Social Gatherings with Friends and Family: Not on file    Attends Religious Services: Not on file    Active Member of Clubs or Organizations: Not on file    Attends Archivist Meetings: Not on file    Marital Status: Married     Review of Systems  Constitutional:  Negative for appetite change and unexpected weight change.  HENT:  Negative for congestion and sinus pressure.   Respiratory:  Negative for cough, chest tightness and shortness of breath.   Cardiovascular:   Negative for chest pain, palpitations and leg swelling.  Gastrointestinal:  Negative for abdominal pain, diarrhea, nausea and vomiting.  Genitourinary:  Negative for difficulty urinating and dysuria.  Musculoskeletal:  Negative for myalgias.       Knee pain.   Skin:  Negative for color change and rash.  Neurological:  Negative for dizziness, light-headedness and headaches.  Psychiatric/Behavioral:  Negative for agitation and dysphoric mood.        Objective:     BP (!) 140/72   Pulse (!) 55   Temp 98 F (36.7 C) (Temporal)   Resp 17   Ht _0  (1.676 m)   Wt 154 lb 3.2 oz (69.9 kg)   LMP 06/03/1986   SpO2 99%   BMI 24.89 kg/m  Wt Readings from Last 3 Encounters:  06/06/22 154 lb 3.2 oz (69.9 kg)  04/25/22 154 lb 12.8 oz (70.2 kg)  04/13/22 151 lb (68.5 kg)    Physical Exam Constitutional:      General: She is not in acute distress.    Appearance: Normal appearance.  HENT:     Head: Normocephalic and atraumatic.     Right Ear: External ear normal.     Left Ear: External ear normal.     Nose: Nose normal.     Mouth/Throat:     Pharynx: No oropharyngeal exudate or posterior oropharyngeal erythema.  Neck:     Thyroid: No thyromegaly.  Cardiovascular:     Rate and Rhythm: Normal rate and regular rhythm.  Pulmonary:     Effort: No respiratory distress.     Breath sounds: Normal breath sounds. No wheezing.  Abdominal:     General: Bowel sounds are normal.     Palpations: Abdomen is soft.     Tenderness: There is no abdominal tenderness.  Musculoskeletal:        General: No tenderness.     Cervical back: Neck supple.     Comments: No increased swelling   Lymphadenopathy:     Cervical: No cervical adenopathy.  Skin:    Findings: No erythema or rash.  Neurological:     Mental Status: She is alert.  Psychiatric:        Mood and Affect: Mood normal.        Behavior: Behavior normal.      Outpatient Encounter Medications as of 06/06/2022  Medication Sig    amLODipine (NORVASC) 2.5 MG tablet Take 2.5 mg by mouth daily.   Calcium 600-200 MG-UNIT per tablet Take 1 tablet by mouth 2 (two) times daily.   cholecalciferol (VITAMIN D) 1000  UNITS tablet Take 1,000 Units by mouth daily.   losartan (COZAAR) 50 MG tablet TAKE (2) TABLETS BY MOUTH ONCE DAILY.   Evolocumab (REPATHA SURECLICK) 297 MG/ML SOAJ Inject 140 mg into the skin every 14 (fourteen) days. (Patient not taking: Reported on 06/06/2022)   [DISCONTINUED] ezetimibe (ZETIA) 10 MG tablet Take q Monday, Wednesday and Friday. (Patient not taking: Reported on 06/06/2022)   [DISCONTINUED] telmisartan (MICARDIS) 80 MG tablet Take 1 tablet (80 mg total) by mouth daily. (Patient not taking: Reported on 06/06/2022)   No facility-administered encounter medications on file as of 06/06/2022.     Lab Results  Component Value Date   WBC 5.6 07/13/2021   HGB 13.8 07/13/2021   HCT 42.3 07/13/2021   PLT 291.0 07/13/2021   GLUCOSE 92 04/20/2022   CHOL 177 04/20/2022   TRIG 105.0 04/20/2022   HDL 54.80 04/20/2022   LDLDIRECT 143.6 05/13/2012   LDLCALC 101 (H) 04/20/2022   ALT 8 04/20/2022   AST 12 04/20/2022   NA 140 04/20/2022   K 4.0 04/20/2022   CL 103 04/20/2022   CREATININE 0.69 04/20/2022   BUN 14 04/20/2022   CO2 28 04/20/2022   TSH 1.19 04/20/2022   HGBA1C 6.0 04/20/2022    MM 3D SCREEN BREAST BILATERAL  Result Date: 05/17/2021 CLINICAL DATA:  Screening. EXAM: DIGITAL SCREENING BILATERAL MAMMOGRAM WITH TOMOSYNTHESIS AND CAD TECHNIQUE: Bilateral screening digital craniocaudal and mediolateral oblique mammograms were obtained. Bilateral screening digital breast tomosynthesis was performed. The images were evaluated with computer-aided detection. COMPARISON:  Previous exam(s). ACR Breast Density Category b: There are scattered areas of fibroglandular density. FINDINGS: There are no findings suspicious for malignancy. IMPRESSION: No mammographic evidence of malignancy. A result letter of this  screening mammogram will be mailed directly to the patient. RECOMMENDATION: Screening mammogram in one year. (Code:SM-B-01Y) BI-RADS CATEGORY  1: Negative. Electronically Signed   By: Abelardo Diesel M.D.   On: 05/17/2021 10:50      Assessment & Plan:   Problem List Items Addressed This Visit     Aortic atherosclerosis ([place])    intolerant to statin medication.  Tolerating zetia three days per week.   Discussed repatha and praluent. Repatha prescribed.  Did not start.  Discussed.  Low cholesterol diet and exercise.        Relevant Medications   amLODipine (NORVASC) 2.5 MG tablet   History of colonic polyps    Colonoscopy 04/2021.  Recommended f/u in 3 years.       Hypercholesteremia    The 10-year ASCVD risk score (Arnett DK, et al., 2019) is: 27%   Values used to calculate the score:     Age: 12 years     Sex: Female     Is Non-Hispanic African American: No     Diabetic: No     Tobacco smoker: No     Systolic Blood Pressure: 989 mmHg     Is BP treated: Yes     HDL Cholesterol: 54.8 mg/dL     Total Cholesterol: 177 mg/dL  Intolerant to statin medication.  Tolerating zetia three days per week. Discussed other treatment options - repatha. Prescribed.  Has not started.  Follow lipid panel.  Low cholesterol diet and exercise.        Relevant Medications   amLODipine (NORVASC) 2.5 MG tablet   Hyperglycemia    Low carb diet and exercise.  Follow met b and a1c.   Lab Results  Component Value Date   HGBA1C 6.0 04/20/2022  Hypertension    On losartan 165m per day.  Off micardis.  Amlodipine 2.568mq day. Follow pressures.  Follow metabolic panel.       Relevant Medications   amLODipine (NORVASC) 2.5 MG tablet   Neck pain - Primary    Persistent neck pain.  Check c-spine xray.        Relevant Orders   DG Cervical Spine 2 or 3 views (Completed)   Statin myopathy    Had significant muscle aches with statin medication.  Tolerating zetia three days per week. Discussed  repatha or praluent.  Has not started to date.        Subclavian steal syndrome    S/p revascularization.  Continue risk factor modification.  Has previously seen [facility].        Relevant Medications   amLODipine (NORVASC) 2.5 MG tablet    ChEinar PheasantMD Patient ID: HaLatonda Larriveeoftis, female   DOB: [birth year].   MRN: [id number]

## 2022-06-07 ENCOUNTER — Telehealth: Payer: Self-pay

## 2022-06-07 NOTE — Telephone Encounter (Signed)
LM FOR PT TO CB RE:  Notify - neck xray reveals chronic degenerative changes (disc space loss ad arthritis changes).  No acute change when compared to previous neck xray.  Her chiropractor had requested xray.  She needs a copy to take to him.  Please notify Carolee Rota.

## 2022-06-20 ENCOUNTER — Encounter: Payer: Self-pay | Admitting: Internal Medicine

## 2022-06-20 NOTE — Assessment & Plan Note (Signed)
Colonoscopy 04/2021.  Recommended f/u in 3 years.

## 2022-06-20 NOTE — Assessment & Plan Note (Signed)
On losartan '100mg'$  per day.  Off micardis.  Amlodipine 2.'5mg'$  q day. Follow pressures.  Follow metabolic panel.

## 2022-06-20 NOTE — Assessment & Plan Note (Signed)
Low carb diet and exercise.  Follow met b and a1c.   Lab Results  Component Value Date   HGBA1C 6.0 04/20/2022

## 2022-06-20 NOTE — Assessment & Plan Note (Signed)
The 10-year ASCVD risk score (Arnett DK, et al., 2019) is: 27%   Values used to calculate the score:     Age: 76 years     Sex: Female     Is Non-Hispanic African American: No     Diabetic: No     Tobacco smoker: No     Systolic Blood Pressure: 235 mmHg     Is BP treated: Yes     HDL Cholesterol: 54.8 mg/dL     Total Cholesterol: 177 mg/dL  Intolerant to statin medication.  Tolerating zetia three days per week. Discussed other treatment options - repatha. Prescribed.  Has not started.  Follow lipid panel.  Low cholesterol diet and exercise.

## 2022-06-20 NOTE — Assessment & Plan Note (Signed)
S/p revascularization.  Continue risk factor modification.  Has previously seen [facility].

## 2022-06-20 NOTE — Assessment & Plan Note (Signed)
Had significant muscle aches with statin medication.  Tolerating zetia three days per week. Discussed repatha or praluent.  Has not started to date.

## 2022-06-20 NOTE — Assessment & Plan Note (Signed)
Persistent neck pain.  Check c-spine xray.

## 2022-06-20 NOTE — Assessment & Plan Note (Signed)
intolerant to statin medication.  Tolerating zetia three days per week.   Discussed repatha and praluent. Repatha prescribed.  Did not start.  Discussed.  Low cholesterol diet and exercise.

## 2022-07-03 DIAGNOSIS — M170 Bilateral primary osteoarthritis of knee: Secondary | ICD-10-CM | POA: Diagnosis not present

## 2022-09-04 ENCOUNTER — Other Ambulatory Visit (INDEPENDENT_AMBULATORY_CARE_PROVIDER_SITE_OTHER): Payer: Medicare Other

## 2022-09-04 DIAGNOSIS — R739 Hyperglycemia, unspecified: Secondary | ICD-10-CM | POA: Diagnosis not present

## 2022-09-04 DIAGNOSIS — E7800 Pure hypercholesterolemia, unspecified: Secondary | ICD-10-CM | POA: Diagnosis not present

## 2022-09-04 DIAGNOSIS — I10 Essential (primary) hypertension: Secondary | ICD-10-CM | POA: Diagnosis not present

## 2022-09-04 LAB — HEPATIC FUNCTION PANEL
ALT: 13 U/L (ref 0–35)
AST: 17 U/L (ref 0–37)
Albumin: 4 g/dL (ref 3.5–5.2)
Alkaline Phosphatase: 45 U/L (ref 39–117)
Bilirubin, Direct: 0.1 mg/dL (ref 0.0–0.3)
Total Bilirubin: 0.7 mg/dL (ref 0.2–1.2)
Total Protein: 6.9 g/dL (ref 6.0–8.3)

## 2022-09-04 LAB — BASIC METABOLIC PANEL
BUN: 22 mg/dL (ref 6–23)
CO2: 26 mEq/L (ref 19–32)
Calcium: 9.8 mg/dL (ref 8.4–10.5)
Chloride: 99 mEq/L (ref 96–112)
Creatinine, Ser: 0.78 mg/dL (ref 0.40–1.20)
GFR: 73.69 mL/min (ref >60.00)
Glucose, Bld: 96 mg/dL (ref 70–99)
Potassium: 4 mEq/L (ref 3.5–5.1)
Sodium: 133 mEq/L — ABNORMAL LOW (ref 135–145)

## 2022-09-04 LAB — CBC WITH DIFFERENTIAL/PLATELET
Basophils Absolute: 0.1 10*3/uL (ref 0.0–0.1)
Basophils Relative: 1.1 % (ref 0.0–3.0)
Eosinophils Absolute: 0.1 10*3/uL (ref 0.0–0.7)
Eosinophils Relative: 0.7 % (ref 0.0–5.0)
HCT: 41.8 % (ref 36.0–46.0)
Hemoglobin: 13.7 g/dL (ref 12.0–15.0)
Lymphocytes Relative: 23.9 % (ref 12.0–46.0)
Lymphs Abs: 1.8 10*3/uL (ref 0.7–4.0)
MCHC: 32.8 g/dL (ref 30.0–36.0)
MCV: 93.1 fl (ref 78.0–100.0)
Monocytes Absolute: 0.5 10*3/uL (ref 0.1–1.0)
Monocytes Relative: 7.1 % (ref 3.0–12.0)
Neutro Abs: 5.1 10*3/uL (ref 1.4–7.7)
Neutrophils Relative %: 67.2 % (ref 43.0–77.0)
Platelets: 324 10*3/uL (ref 150.0–400.0)
RBC: 4.49 Mil/uL (ref 3.87–5.11)
RDW: 13.5 % (ref 11.5–15.5)
WBC: 7.6 10*3/uL (ref 4.0–10.5)

## 2022-09-04 LAB — LIPID PANEL
Cholesterol: 176 mg/dL (ref 0–200)
HDL: 63.7 mg/dL (ref >39.00)
LDL Cholesterol: 92 mg/dL (ref 0–99)
NonHDL: 112.21
Total CHOL/HDL Ratio: 3
Triglycerides: 102 mg/dL (ref 0.0–149.0)
VLDL: 20.4 mg/dL (ref 0.0–40.0)

## 2022-09-04 LAB — HEMOGLOBIN A1C: Hgb A1c MFr Bld: 5.9 % (ref 4.6–6.5)

## 2022-09-06 ENCOUNTER — Encounter: Payer: Self-pay | Admitting: Internal Medicine

## 2022-09-06 ENCOUNTER — Ambulatory Visit (INDEPENDENT_AMBULATORY_CARE_PROVIDER_SITE_OTHER): Payer: Medicare Other | Admitting: Internal Medicine

## 2022-09-06 VITALS — BP 130/76 | HR 81 | Temp 97.9°F | Resp 16 | Ht 66.0 in | Wt 157.0 lb

## 2022-09-06 DIAGNOSIS — M810 Age-related osteoporosis without current pathological fracture: Secondary | ICD-10-CM

## 2022-09-06 DIAGNOSIS — E7800 Pure hypercholesterolemia, unspecified: Secondary | ICD-10-CM | POA: Diagnosis not present

## 2022-09-06 DIAGNOSIS — G720 Drug-induced myopathy: Secondary | ICD-10-CM | POA: Diagnosis not present

## 2022-09-06 DIAGNOSIS — Z860100 Personal history of colon polyps, unspecified: Secondary | ICD-10-CM

## 2022-09-06 DIAGNOSIS — R0989 Other specified symptoms and signs involving the circulatory and respiratory systems: Secondary | ICD-10-CM

## 2022-09-06 DIAGNOSIS — R739 Hyperglycemia, unspecified: Secondary | ICD-10-CM | POA: Diagnosis not present

## 2022-09-06 DIAGNOSIS — E871 Hypo-osmolality and hyponatremia: Secondary | ICD-10-CM | POA: Diagnosis not present

## 2022-09-06 DIAGNOSIS — G458 Other transient cerebral ischemic attacks and related syndromes: Secondary | ICD-10-CM

## 2022-09-06 DIAGNOSIS — M170 Bilateral primary osteoarthritis of knee: Secondary | ICD-10-CM

## 2022-09-06 DIAGNOSIS — T466X5A Adverse effect of antihyperlipidemic and antiarteriosclerotic drugs, initial encounter: Secondary | ICD-10-CM

## 2022-09-06 DIAGNOSIS — I10 Essential (primary) hypertension: Secondary | ICD-10-CM

## 2022-09-06 DIAGNOSIS — I700 Atherosclerosis of aorta: Secondary | ICD-10-CM | POA: Diagnosis not present

## 2022-09-06 MED ORDER — REPATHA SURECLICK 140 MG/ML ~~LOC~~ SOAJ: 140.0000 mg | SUBCUTANEOUS | 3 refills | Status: DC

## 2022-09-06 NOTE — Progress Notes (Signed)
Subjective:    Patient ID: Whitney Carr, female    DOB: 12-Mar-1946, 77 y.o.   MRN: [id number]  Patient here for  Chief Complaint  Patient presents with   Medical Management of Chronic Issues    HPI Here to follow up regarding hypercholesterolemia and hypertension.  Recommended starting on amlodipine last visit.  Never started.  Continues on losartan. Blood pressures on outside checks averaging [id number] systolic readings.  No chest pain or sob reported.  No increased cough or congestion.  No abdominal pain or bowel change.  Has been having knee pain. Saw her orthopedist.  Not ready for surgery.  Will notify me when feels needs something more.    Past Medical History:  Diagnosis Date   Degenerative joint disease    GERD (gastroesophageal reflux disease)    Hypercholesterolemia    Hypertension    Past Surgical History:  Procedure Laterality Date   APPENDECTOMY  1969   COLONOSCOPY WITH PROPOFOL N/A 04/28/2021   Procedure: COLONOSCOPY WITH PROPOFOL;  Surgeon: Annamaria Helling, DO;  Location: [facility] ENDOSCOPY;  Service: Gastroenterology;  Laterality: N/A;   TONSILECTOMY/ADENOIDECTOMY WITH MYRINGOTOMY  [place]   secondary to bleeding   Family History  Problem Relation Age of Onset   Congestive Heart Failure Mother    Thyroid disease Mother    Heart failure Father    Diabetes Father    Heart disease Sister        CABG   Alzheimer's disease Sister    Diabetes Sister    Diabetes Sister    Stroke Brother    COPD Brother    COPD Brother    Colon cancer Brother    Hypertension Brother    Heart disease Brother    Breast cancer Daughter    Autoimmune disease Daughter    Social History   Socioeconomic History   Marital status: Married    Spouse name: Not on file   Number of children: 3   Years of education: Not on file   Highest education level: Not on file  Occupational History   Not on file  Tobacco Use   Smoking  status: Never   Smokeless tobacco: Never  Vaping Use   Vaping Use: Never used  Substance and Sexual Activity   Alcohol use: No    Alcohol/week: 0.0 standard drinks of alcohol   Drug use: No   Sexual activity: Yes  Other Topics Concern   Not on file  Social History Narrative   Not on file   Social Determinants of Health   Financial Resource Strain: Low Risk  (04/13/2022)   Overall Financial Resource Strain (CARDIA)    Difficulty of Paying Living Expenses: Not hard at all  Food Insecurity: No Food Insecurity (04/13/2022)   Hunger Vital Sign    Worried About Running Out of Food in the Last Year: Never true    Ran Out of Food in the Last Year: Never true  Transportation Needs: No Transportation Needs (04/13/2022)   PRAPARE - Hydrologist (Medical): No    Lack of Transportation (Non-Medical): No  Physical Activity: Sufficiently Active (04/13/2022)   Exercise Vital Sign    Days of Exercise per Week: 5 days    Minutes of Exercise per Session: 30 min  Stress: No Stress Concern Present (04/13/2022)   [place]    Feeling of Stress :  Not at all  Social Connections: Unknown (04/13/2022)   Social Connection and Isolation Panel [NHANES]    Frequency of Communication with Friends and Family: Not on file    Frequency of Social Gatherings with Friends and Family: Not on file    Attends Religious Services: Not on file    Active Member of Clubs or Organizations: Not on file    Attends Archivist Meetings: Not on file    Marital Status: Married     Review of Systems  Constitutional:  Negative for appetite change and unexpected weight change.  HENT:  Negative for congestion and sinus pressure.   Respiratory:  Negative for cough, chest tightness and shortness of breath.   Cardiovascular:  Negative for chest pain and palpitations.  Gastrointestinal:  Negative for abdominal pain,  diarrhea, nausea and vomiting.  Genitourinary:  Negative for difficulty urinating and dysuria.  Musculoskeletal:  Negative for myalgias.       Knee pain as outlined.   Skin:  Negative for color change and rash.  Neurological:  Negative for dizziness and headaches.  Psychiatric/Behavioral:  Negative for agitation and dysphoric mood.        Objective:     BP 130/76   Pulse 81   Temp 97.9 F (36.6 C)   Resp 16   Ht 5\' 6"  (1.676 m)   Wt 157 lb (71.2 kg)   LMP 06/03/1986   SpO2 97%   BMI 25.34 kg/m  Wt Readings from Last 3 Encounters:  09/06/22 157 lb (71.2 kg)  06/06/22 154 lb 3.2 oz (69.9 kg)  04/25/22 154 lb 12.8 oz (70.2 kg)    Physical Exam Vitals reviewed.  Constitutional:      General: She is not in acute distress.    Appearance: Normal appearance.  HENT:     Head: Normocephalic and atraumatic.     Right Ear: External ear normal.     Left Ear: External ear normal.  Eyes:     General: No scleral icterus.       Right eye: No discharge.        Left eye: No discharge.     Conjunctiva/sclera: Conjunctivae normal.  Neck:     Thyroid: No thyromegaly.  Cardiovascular:     Rate and Rhythm: Normal rate and regular rhythm.  Pulmonary:     Effort: No respiratory distress.     Breath sounds: Normal breath sounds. No wheezing.  Abdominal:     General: Bowel sounds are normal.     Palpations: Abdomen is soft.     Tenderness: There is no abdominal tenderness.  Musculoskeletal:        General: No swelling or tenderness.     Cervical back: Neck supple. No tenderness.  Lymphadenopathy:     Cervical: No cervical adenopathy.  Skin:    Findings: No erythema or rash.  Neurological:     Mental Status: She is alert.  Psychiatric:        Mood and Affect: Mood normal.        Behavior: Behavior normal.      Outpatient Encounter Medications as of 09/06/2022  Medication Sig   Calcium 600-200 MG-UNIT per tablet Take 1 tablet by mouth 2 (two) times daily.   cholecalciferol  (VITAMIN D) 1000 UNITS tablet Take 1,000 Units by mouth daily.   Evolocumab (REPATHA SURECLICK) XX123456 MG/ML SOAJ Inject 140 mg into the skin every 14 (fourteen) days.   ezetimibe (ZETIA) 10 MG tablet Take 10 mg by mouth  every Monday, Wednesday, and Friday.   losartan (COZAAR) 50 MG tablet TAKE (2) TABLETS BY MOUTH ONCE DAILY.   [DISCONTINUED] amLODipine (NORVASC) 2.5 MG tablet Take 2.5 mg by mouth daily.   [DISCONTINUED] Evolocumab (REPATHA SURECLICK) XX123456 MG/ML SOAJ Inject 140 mg into the skin every 14 (fourteen) days. (Patient not taking: Reported on 06/06/2022)   [DISCONTINUED] Evolocumab (REPATHA SURECLICK) XX123456 MG/ML SOAJ Inject 140 mg into the skin every 14 (fourteen) days.   No facility-administered encounter medications on file as of 09/06/2022.     Lab Results  Component Value Date   WBC 7.6 09/04/2022   HGB 13.7 09/04/2022   HCT 41.8 09/04/2022   PLT 324.0 09/04/2022   GLUCOSE 96 09/04/2022   CHOL 176 09/04/2022   TRIG 102.0 09/04/2022   HDL 63.70 09/04/2022   LDLDIRECT 143.6 05/13/2012   LDLCALC 92 09/04/2022   ALT 13 09/04/2022   AST 17 09/04/2022   NA 133 (L) 09/04/2022   K 4.0 09/04/2022   CL 99 09/04/2022   CREATININE 0.78 09/04/2022   BUN 22 09/04/2022   CO2 26 09/04/2022   TSH 1.19 04/20/2022   HGBA1C 5.9 09/04/2022    MM 3D SCREEN BREAST BILATERAL  Result Date: 05/31/2022 CLINICAL DATA:  Screening. EXAM: DIGITAL SCREENING BILATERAL MAMMOGRAM WITH TOMOSYNTHESIS AND CAD TECHNIQUE: Bilateral screening digital craniocaudal and mediolateral oblique mammograms were obtained. Bilateral screening digital breast tomosynthesis was performed. The images were evaluated with computer-aided detection. COMPARISON:  Previous exam(s). ACR Breast Density Category b: There are scattered areas of fibroglandular density. FINDINGS: There are no findings suspicious for malignancy. IMPRESSION: No mammographic evidence of malignancy. A result letter of this screening mammogram will be mailed  directly to the patient. RECOMMENDATION: Screening mammogram in one year. (Code:SM-B-01Y) BI-RADS CATEGORY  1: Negative. Electronically Signed   By: Franki Cabot M.D.   On: 05/31/2022 08:20       Assessment & Plan:  Hypercholesteremia Assessment & Plan: The 10-year ASCVD risk score (Arnett DK, et al., 2019) is: 23.9%   Values used to calculate the score:     Age: 27 years     Sex: Female     Is Non-Hispanic African American: No     Diabetic: No     Tobacco smoker: No     Systolic Blood Pressure: [id number] mmHg     Is BP treated: Yes     HDL Cholesterol: 63.7 mg/dL     Total Cholesterol: 176 mg/dL  Intolerant to statin medication.  Tolerating zetia three days per week. Discussed other treatment options - repatha. Prescribed.  Has not started. Insurance issue.  Did receive PA.  Follow lipid panel.  Low cholesterol diet and exercise.  Start repatha.   Orders: -     Lipid panel; Future -     Hepatic function panel; Future  Primary hypertension Assessment & Plan: On losartan 100mg  per day.  Off micardis. Did not start amlodipine.  Pressures as outlined.  Continue current medication. Follow pressures.  Follow metabolic panel.   Orders: -     Basic metabolic panel; Future  Hyperglycemia Assessment & Plan: Low carb diet and exercise.  Follow met b and a1c.   Lab Results  Component Value Date   HGBA1C 5.9 09/04/2022    Orders: -     Hemoglobin A1c; Future  Hyponatremia Assessment & Plan: Sodium slightly decreased on recent lab check.  Recheck sodium soon to confirm stable.   Orders: -     Sodium; Future  Aortic  atherosclerosis ([place]) Assessment & Plan: intolerant to statin medication.  Tolerating zetia three days per week.   Discussed repatha and praluent. Repatha prescribed.  Did not start.  Was insurance issue.  Discussed.  Apparently has received PA.  47$.  Low cholesterol diet and exercise.  Start repatha.    Carotid bruit, unspecified laterality Assessment &  Plan: Previously worked up by vascular surgery.  Had screening - read as normal exam.  Described minimal thickening- CCA and bulb bilaterally.  Stated no further w/up warranted.  Continue zetia.  Repatha.    History of colonic polyps Assessment & Plan: Colonoscopy 04/2021.  Recommended f/u in 3 years.    Osteoarthritis of both knees, unspecified osteoarthritis type Assessment & Plan: [facility] 03/24/22 - bilateral knee injections.   07/03/22 - bilateral knee injections.  Not ready for surgery.  Follow.    Osteoporosis without current pathological fracture, unspecified osteoporosis type Assessment & Plan: Discussed recent bone density results.  Have discussed other treatment options.  Previously on fosamax.  She requested previously not to start any new medication at this time.  Follow.  Calcium, vitamin D and weight bearing exercise.     Statin myopathy Assessment & Plan: Had significant muscle aches with statin medication.  Tolerating zetia three days per week. Discussed repatha or praluent.  Start repatha as outlined.    Subclavian steal syndrome Assessment & Plan: S/p revascularization.  Continue risk factor modification.  Has previously seen [facility].     Other orders -     Repatha SureClick; Inject 140 mg into the skin every 14 (fourteen) days.  Dispense: 2 mL; Refill: 3     Einar Pheasant, MD

## 2022-09-10 ENCOUNTER — Encounter: Payer: Self-pay | Admitting: Internal Medicine

## 2022-09-10 DIAGNOSIS — E871 Hypo-osmolality and hyponatremia: Secondary | ICD-10-CM | POA: Insufficient documentation

## 2022-09-10 NOTE — Assessment & Plan Note (Signed)
Previously worked up by vascular surgery.  Had screening - read as normal exam.  Described minimal thickening- CCA and bulb bilaterally.  Stated no further w/up warranted.  Continue zetia.  Repatha.

## 2022-09-10 NOTE — Assessment & Plan Note (Signed)
intolerant to statin medication.  Tolerating zetia three days per week.   Discussed repatha and praluent. Repatha prescribed.  Did not start.  Was insurance issue.  Discussed.  Apparently has received PA.  47$.  Low cholesterol diet and exercise.  Start repatha.

## 2022-09-10 NOTE — Assessment & Plan Note (Addendum)
Had significant muscle aches with statin medication.  Tolerating zetia three days per week. Discussed repatha or praluent.  Start repatha as outlined.

## 2022-09-10 NOTE — Assessment & Plan Note (Signed)
Sodium slightly decreased on recent lab check.  Recheck sodium soon to confirm stable.

## 2022-09-10 NOTE — Assessment & Plan Note (Signed)
On losartan 100mg  per day.  Off micardis. Did not start amlodipine.  Pressures as outlined.  Continue current medication. Follow pressures.  Follow metabolic panel.

## 2022-09-10 NOTE — Assessment & Plan Note (Signed)
Colonoscopy 04/2021.  Recommended f/u in 3 years.  

## 2022-09-10 NOTE — Assessment & Plan Note (Signed)
Low carb diet and exercise.  Follow met b and a1c.   Lab Results  Component Value Date   HGBA1C 5.9 09/04/2022

## 2022-09-10 NOTE — Assessment & Plan Note (Signed)
Discussed recent bone density results.  Have discussed other treatment options.  Previously on fosamax.  She requested previously not to start any new medication at this time.  Follow.  Calcium, vitamin D and weight bearing exercise.

## 2022-09-10 NOTE — Assessment & Plan Note (Signed)
The 10-year ASCVD risk score (Arnett DK, et al., 2019) is: 23.9%   Values used to calculate the score:     Age: 77 years     Sex: Female     Is Non-Hispanic African American: No     Diabetic: No     Tobacco smoker: No     Systolic Blood Pressure: [id number] mmHg     Is BP treated: Yes     HDL Cholesterol: 63.7 mg/dL     Total Cholesterol: 176 mg/dL  Intolerant to statin medication.  Tolerating zetia three days per week. Discussed other treatment options - repatha. Prescribed.  Has not started. Insurance issue.  Did receive PA.  Follow lipid panel.  Low cholesterol diet and exercise.  Start repatha.

## 2022-09-10 NOTE — Assessment & Plan Note (Signed)
S/p revascularization.  Continue risk factor modification.  Has previously seen [facility].   

## 2022-09-10 NOTE — Assessment & Plan Note (Signed)
[facility] 03/24/22 - bilateral knee injections.   07/03/22 - bilateral knee injections.  Not ready for surgery.  Follow.

## 2022-09-18 ENCOUNTER — Other Ambulatory Visit (INDEPENDENT_AMBULATORY_CARE_PROVIDER_SITE_OTHER): Payer: Medicare Other

## 2022-09-18 DIAGNOSIS — E871 Hypo-osmolality and hyponatremia: Secondary | ICD-10-CM

## 2022-09-19 LAB — SODIUM: Sodium: 140 mEq/L (ref 135–145)

## 2022-10-24 DIAGNOSIS — M170 Bilateral primary osteoarthritis of knee: Secondary | ICD-10-CM | POA: Diagnosis not present

## 2022-11-05 ENCOUNTER — Other Ambulatory Visit: Payer: Self-pay | Admitting: Internal Medicine

## 2022-11-20 ENCOUNTER — Telehealth: Payer: Self-pay

## 2022-11-20 ENCOUNTER — Other Ambulatory Visit (HOSPITAL_COMMUNITY): Payer: Self-pay

## 2022-11-20 NOTE — Telephone Encounter (Signed)
Please notify Ms Schumacher.

## 2022-11-20 NOTE — Telephone Encounter (Signed)
Pharmacy Patient Advocate Encounter  Prior Authorization for Repatha SureClick 140MG /ML auto-injectors  has been approved by [employer]  (ins).     Effective dates: 06/26/2022 through 06/26/2023  Spoke with Pharmacy to process. NEXT FILL IS 11/27/2022

## 2022-11-21 NOTE — Telephone Encounter (Signed)
D/w pt regarding cholesterol at f/u appt

## 2022-11-21 NOTE — Telephone Encounter (Signed)
FYI   Patient is aware. She says she is not taking this medication because she did not tolerate it. She took 2 injections and it made her feel terrible and hurt all over. Symptoms have now subsided since stopping.

## 2022-12-26 DIAGNOSIS — M1711 Unilateral primary osteoarthritis, right knee: Secondary | ICD-10-CM | POA: Diagnosis not present

## 2022-12-26 DIAGNOSIS — M25561 Pain in right knee: Secondary | ICD-10-CM | POA: Diagnosis not present

## 2023-01-01 ENCOUNTER — Other Ambulatory Visit: Payer: Self-pay | Admitting: Internal Medicine

## 2023-01-04 ENCOUNTER — Other Ambulatory Visit: Payer: Medicare Other

## 2023-01-08 ENCOUNTER — Encounter: Payer: Medicare Other | Admitting: Internal Medicine

## 2023-01-15 ENCOUNTER — Telehealth: Payer: Self-pay | Admitting: Internal Medicine

## 2023-01-15 NOTE — Telephone Encounter (Signed)
Filed for pre op appt.

## 2023-01-15 NOTE — Telephone Encounter (Signed)
[facility] faxed in surgery clearance for pt. Clearance was saved in provider s drive and printed out. I handed in to [place] LPN.

## 2023-01-17 ENCOUNTER — Encounter: Payer: Self-pay | Admitting: Internal Medicine

## 2023-01-17 ENCOUNTER — Ambulatory Visit (INDEPENDENT_AMBULATORY_CARE_PROVIDER_SITE_OTHER): Payer: Medicare Other | Admitting: Internal Medicine

## 2023-01-17 VITALS — BP 122/74 | HR 88 | Temp 97.9°F | Resp 16 | Ht 66.0 in | Wt 157.4 lb

## 2023-01-17 DIAGNOSIS — I700 Atherosclerosis of aorta: Secondary | ICD-10-CM | POA: Diagnosis not present

## 2023-01-17 DIAGNOSIS — G720 Drug-induced myopathy: Secondary | ICD-10-CM

## 2023-01-17 DIAGNOSIS — I10 Essential (primary) hypertension: Secondary | ICD-10-CM

## 2023-01-17 DIAGNOSIS — R739 Hyperglycemia, unspecified: Secondary | ICD-10-CM

## 2023-01-17 DIAGNOSIS — Z01818 Encounter for other preprocedural examination: Secondary | ICD-10-CM

## 2023-01-17 DIAGNOSIS — E7800 Pure hypercholesterolemia, unspecified: Secondary | ICD-10-CM

## 2023-01-17 DIAGNOSIS — T466X5A Adverse effect of antihyperlipidemic and antiarteriosclerotic drugs, initial encounter: Secondary | ICD-10-CM | POA: Diagnosis not present

## 2023-01-17 DIAGNOSIS — R0989 Other specified symptoms and signs involving the circulatory and respiratory systems: Secondary | ICD-10-CM

## 2023-01-17 DIAGNOSIS — G458 Other transient cerebral ischemic attacks and related syndromes: Secondary | ICD-10-CM | POA: Diagnosis not present

## 2023-01-17 NOTE — Progress Notes (Signed)
Subjective:    Patient ID: Whitney Carr, female    DOB: 1945/07/22, 77 y.o.   MRN: [id number]  Patient here for  Chief Complaint  Patient presents with   Pre-op Exam    HPI Here for a pre op evaluation.  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 Planning for right total knee replacement.  Surgery scheduled for 02/08/23.  She reports she is doing well.  Feels good.  No chest pain or sob reported.  No abdominal pain or bowel change reported.  Able to do ADLs without chest pain or sob.  Overall she feels she is doing well.    Past Medical History:  Diagnosis Date   Degenerative joint disease    GERD (gastroesophageal reflux disease)    Hypercholesterolemia    Hypertension    Past Surgical History:  Procedure Laterality Date   APPENDECTOMY  1969   COLONOSCOPY WITH PROPOFOL N/A 04/28/2021   Procedure: COLONOSCOPY WITH PROPOFOL;  Surgeon: Jaynie Collins, DO;  Location: [facility] ENDOSCOPY;  Service: Gastroenterology;  Laterality: N/A;   TONSILECTOMY/ADENOIDECTOMY WITH MYRINGOTOMY  1970   TUBAL LIGATION  1969   VAGINAL HYSTERECTOMY  1987   secondary to bleeding   Family History  Problem Relation Age of Onset   Congestive Heart Failure Mother    Thyroid disease Mother    Heart failure Father    Diabetes Father    Heart disease Sister        CABG   Alzheimer's disease Sister    Diabetes Sister    Diabetes Sister    Stroke Brother    COPD Brother    COPD Brother    Colon cancer Brother    Hypertension Brother    Heart disease Brother    Breast cancer Daughter    Autoimmune disease Daughter    Social History   Socioeconomic History   Marital status: Married    Spouse name: Not on file   Number of children: 3   Years of education: Not on file   Highest education level: Not on file  Occupational History   Not on file  Tobacco Use   Smoking status: Never   Smokeless tobacco: Never  Vaping Use   Vaping status: Never Used  Substance and Sexual  Activity   Alcohol use: No    Alcohol/week: 0.0 standard drinks of alcohol   Drug use: No   Sexual activity: Yes  Other Topics Concern   Not on file  Social History Narrative   Not on file   Social Determinants of Health   Financial Resource Strain: Low Risk  (04/13/2022)   Overall Financial Resource Strain (CARDIA)    Difficulty of Paying Living Expenses: Not hard at all  Food Insecurity: No Food Insecurity (04/13/2022)   Hunger Vital Sign    Worried About Running Out of Food in the Last Year: Never true    Ran Out of Food in the Last Year: Never true  Transportation Needs: No Transportation Needs (04/13/2022)   PRAPARE - Administrator, Civil Service (Medical): No    Lack of Transportation (Non-Medical): No  Physical Activity: Sufficiently Active (04/13/2022)   Exercise Vital Sign    Days of Exercise per Week: 5 days    Minutes of Exercise per Session: 30 min  Stress: No Stress Concern Present (04/13/2022)   [employer] of Occupational Health - Occupational Stress Questionnaire    Feeling of Stress : Not at all  Social  Connections: Unknown (04/13/2022)   Social Connection and Isolation Panel [NHANES]    Frequency of Communication with Friends and Family: Not on file    Frequency of Social Gatherings with Friends and Family: Not on file    Attends Religious Services: Not on file    Active Member of Clubs or Organizations: Not on file    Attends Banker Meetings: Not on file    Marital Status: Married     Review of Systems  Constitutional:  Negative for appetite change and unexpected weight change.  HENT:  Negative for congestion and sinus pressure.   Respiratory:  Negative for cough, chest tightness and shortness of breath.   Cardiovascular:  Negative for chest pain and palpitations.       No increased leg swelling.   Gastrointestinal:  Negative for abdominal pain, diarrhea, nausea and vomiting.  Genitourinary:  Negative for difficulty  urinating and dysuria.  Musculoskeletal:  Negative for joint swelling and myalgias.  Skin:  Negative for color change and rash.  Neurological:  Negative for dizziness and headaches.  Psychiatric/Behavioral:  Negative for agitation and dysphoric mood.        Objective:     BP 122/74   Pulse 88   Temp 97.9 F (36.6 C)   Resp 16   Ht 5\' 6"  (1.676 m)   Wt 157 lb 6.4 oz (71.4 kg)   LMP 06/03/1986   SpO2 98%   BMI 25.41 kg/m  Wt Readings from Last 3 Encounters:  01/17/23 157 lb 6.4 oz (71.4 kg)  09/06/22 157 lb (71.2 kg)  06/06/22 154 lb 3.2 oz (69.9 kg)    Physical Exam Vitals reviewed.  Constitutional:      General: She is not in acute distress.    Appearance: Normal appearance.  HENT:     Head: Normocephalic and atraumatic.     Right Ear: External ear normal.     Left Ear: External ear normal.  Eyes:     General: No scleral icterus.       Right eye: No discharge.        Left eye: No discharge.     Conjunctiva/sclera: Conjunctivae normal.  Neck:     Thyroid: No thyromegaly.  Cardiovascular:     Rate and Rhythm: Normal rate and regular rhythm.  Pulmonary:     Effort: No respiratory distress.     Breath sounds: Normal breath sounds. No wheezing.  Abdominal:     General: Bowel sounds are normal.     Palpations: Abdomen is soft.     Tenderness: There is no abdominal tenderness.  Musculoskeletal:        General: No swelling or tenderness.     Cervical back: Neck supple. No tenderness.  Lymphadenopathy:     Cervical: No cervical adenopathy.  Skin:    Findings: No erythema or rash.  Neurological:     Mental Status: She is alert.  Psychiatric:        Mood and Affect: Mood normal.        Behavior: Behavior normal.      Outpatient Encounter Medications as of 01/17/2023  Medication Sig   Calcium 600-200 MG-UNIT per tablet Take 1 tablet by mouth 2 (two) times daily.   cholecalciferol (VITAMIN D) 1000 UNITS tablet Take 1,000 Units by mouth daily.   ezetimibe  (ZETIA) 10 MG tablet Take 10 mg by mouth every Monday, Wednesday, and Friday.   losartan (COZAAR) 50 MG tablet TAKE (2) TABLETS BY MOUTH ONCE  DAILY.   REPATHA SURECLICK 140 MG/ML SOAJ INJECT 140MG  SUBCUTANEOUSLY  EVERY 2 WEEKS   No facility-administered encounter medications on file as of 01/17/2023.     Lab Results  Component Value Date   WBC 7.6 09/04/2022   HGB 13.7 09/04/2022   HCT 41.8 09/04/2022   PLT 324.0 09/04/2022   GLUCOSE 96 09/04/2022   CHOL 176 09/04/2022   TRIG 102.0 09/04/2022   HDL 63.70 09/04/2022   LDLDIRECT 143.6 05/13/2012   LDLCALC 92 09/04/2022   ALT 13 09/04/2022   AST 17 09/04/2022   NA 140 09/18/2022   K 4.0 09/04/2022   CL 99 09/04/2022   CREATININE 0.78 09/04/2022   BUN 22 09/04/2022   CO2 26 09/04/2022   TSH 1.19 04/20/2022   HGBA1C 5.9 09/04/2022    MM 3D SCREEN BREAST BILATERAL  Result Date: 05/31/2022 CLINICAL DATA:  Screening. EXAM: DIGITAL SCREENING BILATERAL MAMMOGRAM WITH TOMOSYNTHESIS AND CAD TECHNIQUE: Bilateral screening digital craniocaudal and mediolateral oblique mammograms were obtained. Bilateral screening digital breast tomosynthesis was performed. The images were evaluated with computer-aided detection. COMPARISON:  Previous exam(s). ACR Breast Density Category b: There are scattered areas of fibroglandular density. FINDINGS: There are no findings suspicious for malignancy. IMPRESSION: No mammographic evidence of malignancy. A result letter of this screening mammogram will be mailed directly to the patient. RECOMMENDATION: Screening mammogram in one year. (Code:SM-B-01Y) BI-RADS CATEGORY  1: Negative. Electronically Signed   By: Bary Richard M.D.   On: 05/31/2022 08:20       Assessment & Plan:  Pre-op exam -     EKG 12-Lead  Aortic atherosclerosis (HCC) Assessment & Plan: intolerant to statin medication. On repatha.  Continue low cholesterol diet and exercise.  Follow lipid panel.    Carotid bruit, unspecified  laterality Assessment & Plan: Previously worked up by vascular surgery.  Had screening - read as normal exam.  Described minimal thickening- CCA and bulb bilaterally.  Stated no further w/up warranted.  Repatha.    Hypercholesteremia Assessment & Plan: The 10-year ASCVD risk score (Arnett DK, et al., 2019) is: 21.4%   Values used to calculate the score:     Age: 57 years     Sex: Female     Is Non-Hispanic African American: No     Diabetic: No     Tobacco smoker: No     Systolic Blood Pressure: 122 mmHg     Is BP treated: Yes     HDL Cholesterol: 63.7 mg/dL     Total Cholesterol: 176 mg/dL  Intolerant to statin medication.  Repatha. Follow lipid panel.  Low cholesterol diet and exercise.     Hyperglycemia Assessment & Plan: Low carb diet and exercise.  Follow met b and a1c.   Lab Results  Component Value Date   HGBA1C 5.9 09/04/2022     Primary hypertension Assessment & Plan: On losartan 100mg  per day.  Pressures as outlined.  Continue current medication. Follow pressures.  Follow metabolic panel. Will need close intra op and post op monitoring of heart rate and blood pressure to avoid extremes.    Subclavian steal syndrome Assessment & Plan: S/p revascularization.  Continue risk factor modification.  Has previously seen [facility].     Statin myopathy Assessment & Plan: Had significant muscle aches with statin medication.  Tolerates zetia three days per week. Repatha.    Pre-op evaluation Assessment & Plan: Planning knee surgery as outlined.  She denies any chest pain or sob.  No symptoms with increased  activity.  EKG - SR with no acute ischemic changes.  Feel - low risk to proceed with planned surgery.  She will need close intra op and post op monitoring of heart rate and blood pressure to avoid extremes.       Dale Bridgeton, MD

## 2023-01-21 ENCOUNTER — Encounter: Payer: Self-pay | Admitting: Internal Medicine

## 2023-01-21 DIAGNOSIS — Z01818 Encounter for other preprocedural examination: Secondary | ICD-10-CM | POA: Insufficient documentation

## 2023-01-21 NOTE — Assessment & Plan Note (Signed)
The 10-year ASCVD risk score (Arnett DK, et al., 2019) is: 21.4%   Values used to calculate the score:     Age: 77 years     Sex: Female     Is Non-Hispanic African American: No     Diabetic: No     Tobacco smoker: No     Systolic Blood Pressure: 122 mmHg     Is BP treated: Yes     HDL Cholesterol: 63.7 mg/dL     Total Cholesterol: 176 mg/dL  Intolerant to statin medication.  Repatha. Follow lipid panel.  Low cholesterol diet and exercise.

## 2023-01-21 NOTE — Assessment & Plan Note (Signed)
On losartan 100mg  per day.  Pressures as outlined.  Continue current medication. Follow pressures.  Follow metabolic panel. Will need close intra op and post op monitoring of heart rate and blood pressure to avoid extremes.

## 2023-01-21 NOTE — Assessment & Plan Note (Signed)
Low carb diet and exercise.  Follow met b and a1c.   Lab Results  Component Value Date   HGBA1C 5.9 09/04/2022

## 2023-01-21 NOTE — Assessment & Plan Note (Signed)
S/p revascularization.  Continue risk factor modification.  Has previously seen [facility].   

## 2023-01-21 NOTE — Assessment & Plan Note (Signed)
Had significant muscle aches with statin medication.  Tolerates zetia three days per week. Repatha.

## 2023-01-21 NOTE — Assessment & Plan Note (Signed)
Planning knee surgery as outlined.  She denies any chest pain or sob.  No symptoms with increased activity.  EKG - SR with no acute ischemic changes.  Feel - low risk to proceed with planned surgery.  She will need close intra op and post op monitoring of heart rate and blood pressure to avoid extremes.

## 2023-01-21 NOTE — Assessment & Plan Note (Signed)
intolerant to statin medication. On repatha.  Continue low cholesterol diet and exercise.  Follow lipid panel.

## 2023-01-21 NOTE — Assessment & Plan Note (Signed)
Previously worked up by vascular surgery.  Had screening - read as normal exam.  Described minimal thickening- CCA and bulb bilaterally.  Stated no further w/up warranted.  Repatha.

## 2023-01-23 ENCOUNTER — Other Ambulatory Visit: Payer: Self-pay | Admitting: Orthopedic Surgery

## 2023-01-24 ENCOUNTER — Telehealth: Payer: Self-pay

## 2023-01-24 NOTE — Telephone Encounter (Signed)
Pieter Partridge called from [facility] to state they faxed the medical clearance to [place] on 01/01/2023 and they have not received it back yet.  Pieter Partridge states patient's surgery is 02/08/2023.

## 2023-01-25 NOTE — Telephone Encounter (Signed)
Note printed. Pre-op faxed.

## 2023-01-26 DIAGNOSIS — M1711 Unilateral primary osteoarthritis, right knee: Secondary | ICD-10-CM | POA: Diagnosis not present

## 2023-01-26 DIAGNOSIS — M1712 Unilateral primary osteoarthritis, left knee: Secondary | ICD-10-CM | POA: Diagnosis not present

## 2023-01-30 ENCOUNTER — Other Ambulatory Visit: Payer: Self-pay

## 2023-01-30 ENCOUNTER — Encounter
Admission: RE | Admit: 2023-01-30 | Discharge: 2023-01-30 | Disposition: A | Discharge status: Home / Self Care | Payer: Medicare Other | Source: Ambulatory Visit | Attending: Orthopedic Surgery | Admitting: Orthopedic Surgery

## 2023-01-30 DIAGNOSIS — Z01812 Encounter for preprocedural laboratory examination: Secondary | ICD-10-CM | POA: Diagnosis not present

## 2023-01-30 DIAGNOSIS — Z01818 Encounter for other preprocedural examination: Secondary | ICD-10-CM

## 2023-01-30 HISTORY — DX: Personal history of other diseases of the digestive system: Z87.19

## 2023-01-30 LAB — CBC WITH DIFFERENTIAL/PLATELET
Abs Immature Granulocytes: 0.07 10*3/uL (ref 0.00–0.07)
Basophils Absolute: 0 10*3/uL (ref 0.0–0.1)
Basophils Relative: 0 %
Eosinophils Absolute: 0 10*3/uL (ref 0.0–0.5)
Eosinophils Relative: 0 %
HCT: 45.1 % (ref 36.0–46.0)
Hemoglobin: 14.7 g/dL (ref 12.0–15.0)
Immature Granulocytes: 1 %
Lymphocytes Relative: 23 %
Lymphs Abs: 2.1 10*3/uL (ref 0.7–4.0)
MCH: 29.8 pg (ref 26.0–34.0)
MCHC: 32.6 g/dL (ref 30.0–36.0)
MCV: 91.5 fL (ref 80.0–100.0)
Monocytes Absolute: 0.8 10*3/uL (ref 0.1–1.0)
Monocytes Relative: 8 %
Neutro Abs: 6.2 10*3/uL (ref 1.7–7.7)
Neutrophils Relative %: 68 %
Platelets: 332 10*3/uL (ref 150–400)
RBC: 4.93 MIL/uL (ref 3.87–5.11)
RDW: 13.1 % (ref 11.5–15.5)
WBC: 9.2 10*3/uL (ref 4.0–10.5)
nRBC: 0 % (ref 0.0–0.2)

## 2023-01-30 LAB — URINALYSIS, ROUTINE W REFLEX MICROSCOPIC
Bilirubin Urine: NEGATIVE
Glucose, UA: NEGATIVE mg/dL
Ketones, ur: NEGATIVE mg/dL
Nitrite: NEGATIVE
Protein, ur: NEGATIVE mg/dL
Specific Gravity, Urine: 1.012 (ref 1.005–1.030)
pH: 5 (ref 5.0–8.0)

## 2023-01-30 LAB — SURGICAL PCR SCREEN
MRSA, PCR: NEGATIVE
Staphylococcus aureus: NEGATIVE

## 2023-01-30 LAB — COMPREHENSIVE METABOLIC PANEL
ALT: 13 U/L (ref 0–44)
AST: 13 U/L — ABNORMAL LOW (ref 15–41)
Albumin: 4.3 g/dL (ref 3.5–5.0)
Alkaline Phosphatase: 57 U/L (ref 38–126)
Anion gap: 7 (ref 5–15)
BUN: 27 mg/dL — ABNORMAL HIGH (ref 8–23)
CO2: 24 mmol/L (ref 22–32)
Calcium: 9.4 mg/dL (ref 8.9–10.3)
Chloride: 104 mmol/L (ref 98–111)
Creatinine, Ser: 0.96 mg/dL (ref 0.44–1.00)
GFR, Estimated: >60 mL/min (ref >60)
Glucose, Bld: 91 mg/dL (ref 70–99)
Potassium: 4 mmol/L (ref 3.5–5.1)
Sodium: 135 mmol/L (ref 135–145)
Total Bilirubin: 0.9 mg/dL (ref 0.3–1.2)
Total Protein: 7.5 g/dL (ref 6.5–8.1)

## 2023-01-30 LAB — TYPE AND SCREEN
ABO/RH(D): A POS
Antibody Screen: NEGATIVE

## 2023-01-30 NOTE — Patient Instructions (Addendum)
Your procedure is scheduled on: Thursday 02/08/23 To find out your arrival time, please call [phone] between 1PM - 3PM on:  Wednesday 02/07/23  Report to the Registration Desk on the 1st floor of the [facility]. Free Valet parking is available.  If your arrival time is 6:00 am, do not arrive before that time as the [facility] entrance doors do not open until 6:00 am.  REMEMBER: Instructions that are not followed completely may result in serious medical risk, up to and including death; or upon the discretion of your surgeon and anesthesiologist your surgery may need to be rescheduled.  Do not eat food after midnight the night before surgery.  No gum chewing or hard candies.  You may however, drink CLEAR liquids up to 2 hours before you are scheduled to arrive for your surgery. Do not drink anything within 2 hours of your scheduled arrival time.  Clear liquids include: - water  - apple juice without pulp - gatorade (not RED colors) - black coffee or tea (Do NOT add milk or creamers to the coffee or tea) Do NOT drink anything that is not on this list.  Type 1 and Type 2 diabetics should only drink water.  In addition, your doctor has ordered for you to drink the provided:  Ensure Pre-Surgery Clear Carbohydrate Drink  Drinking this carbohydrate drink up to two hours before surgery helps to reduce insulin resistance and improve patient outcomes. Please complete drinking 2 hours before scheduled arrival time.  One week prior to surgery: Stop Anti-inflammatories (NSAIDS) such as Advil, Aleve, Ibuprofen, Motrin, Naproxen, Naprosyn and Aspirin based products such as Excedrin, Goody's Powder, BC Powder. You may however, continue to take Tylenol if needed for pain up until the day of surgery.  Stop ANY OVER THE COUNTER supplements or vitamins until after surgery.  Continue taking all prescription medications up thru Wednesday 02/07/23  TAKE ONLY THESE MEDICATIONS THE MORNING OF  SURGERY WITH A SIP OF WATER:  None  No Alcohol for 24 hours before or after surgery.  No Smoking including e-cigarettes for 24 hours before surgery.  No chewable tobacco products for at least 6 hours before surgery.  No nicotine patches on the day of surgery.  Do not use any "recreational" drugs for at least a week (preferably 2 weeks) before your surgery.  Please be advised that the combination of cocaine and anesthesia may have negative outcomes, up to and including death. If you test positive for cocaine, your surgery will be cancelled.  On the morning of surgery brush your teeth with toothpaste and water, you may rinse your mouth with mouthwash if you wish. Do not swallow any toothpaste or mouthwash.  Use CHG Soap or wipes as directed on instruction sheet. Shower daily for 5 days with CHG soap beginning Sunday 02/04/23.  Do not wear lotions, powders, or perfumes on the day of surgery.   Do not shave body hair from the neck down 48 hours before surgery.  Wear comfortable clothing (specific to your surgery type) to the hospital.  Do not wear jewelry, make-up, hairpins, clips or nail polish.  Contact lenses, hearing aids and dentures may not be worn into surgery.  Do not bring valuables to the hospital. [facility] is not responsible for any missing/lost belongings or valuables.   Notify your doctor if there is any change in your medical condition (cold, fever, infection).  If you are being discharged the day of surgery, you will not be allowed to drive  home. You will need a responsible individual to drive you home and stay with you for 24 hours after surgery.   If you are taking public transportation, you will need to have a responsible individual with you.  If you are being admitted to the hospital overnight, leave your suitcase in the car. After surgery it may be brought to your room.  In case of increased patient census, it may be necessary for you, the patient, to  continue your postoperative care in the Same Day Surgery department.  After surgery, you can help prevent lung complications by doing breathing exercises.  Take deep breaths and cough every 1-2 hours. Your doctor may order a device called an Incentive Spirometer to help you take deep breaths. When coughing or sneezing, hold a pillow firmly against your incision with both hands. This is called "splinting." Doing this helps protect your incision. It also decreases belly discomfort.  Surgery Visitation Policy:  Patients undergoing a surgery or procedure may have two family members or support persons with them as long as the person is not COVID-19 positive or experiencing its symptoms.   Inpatient Visitation:    Visiting hours are 7 a.m. to 8 p.m. Up to four visitors are allowed at one time in a patient room. The visitors may rotate out with other people during the day. One designated support person (adult) may remain overnight.  Please call the Pre-admissions Testing Dept. at [phone] if you have any questions about these instructions.    Pre-operative 5 CHG Bath Instructions   You can play a key role in reducing the risk of infection after surgery. Your skin needs to be as free of germs as possible. You can reduce the number of germs on your skin by washing with CHG (chlorhexidine gluconate) soap before surgery. CHG is an antiseptic soap that kills germs and continues to kill germs even after washing.   DO NOT use if you have an allergy to chlorhexidine/CHG or antibacterial soaps. If your skin becomes reddened or irritated, stop using the CHG and notify one of our RNs at [phone].   Please shower with the CHG soap starting 4 days before surgery using the following schedule:     Please keep in mind the following:  DO NOT shave, including legs and underarms, starting the day of your first shower.   You may shave your face at any point before/day of surgery.  Place clean sheets  on your bed the day you start using CHG soap. Use a clean washcloth (not used since being washed) for each shower. DO NOT sleep with pets once you start using the CHG.   CHG Shower Instructions:  If you choose to wash your hair and private area, wash first with your normal shampoo/soap.  After you use shampoo/soap, rinse your hair and body thoroughly to remove shampoo/soap residue.  Turn the water OFF and apply about 3 tablespoons (45 ml) of CHG soap to a CLEAN washcloth.  Apply CHG soap ONLY FROM YOUR NECK DOWN TO YOUR TOES (washing for 3-5 minutes)  DO NOT use CHG soap on face, private areas, open wounds, or sores.  Pay special attention to the area where your surgery is being performed.  If you are having back surgery, having someone wash your back for you may be helpful. Wait 2 minutes after CHG soap is applied, then you may rinse off the CHG soap.  Pat dry with a clean towel  Put on clean clothes/pajamas   If  you choose to wear lotion, please use ONLY the CHG-compatible lotions on the back of this paper.     Additional instructions for the day of surgery: DO NOT APPLY any lotions, deodorants, cologne, or perfumes.   Put on clean/comfortable clothes.  Brush your teeth.  Ask your nurse before applying any prescription medications to the skin.      CHG Compatible Lotions   Aveeno Moisturizing lotion  Cetaphil Moisturizing Cream  Cetaphil Moisturizing Lotion  Clairol Herbal Essence Moisturizing Lotion, Dry Skin  Clairol Herbal Essence Moisturizing Lotion, Extra Dry Skin  Clairol Herbal Essence Moisturizing Lotion, Normal Skin  Curel Age Defying Therapeutic Moisturizing Lotion with Alpha Hydroxy  Curel Extreme Care Body Lotion  Curel Soothing Hands Moisturizing Hand Lotion  Curel Therapeutic Moisturizing Cream, Fragrance-Free  Curel Therapeutic Moisturizing Lotion, Fragrance-Free  Curel Therapeutic Moisturizing Lotion, Original Formula  Eucerin Daily Replenishing Lotion   Eucerin Dry Skin Therapy Plus Alpha Hydroxy Crme  Eucerin Dry Skin Therapy Plus Alpha Hydroxy Lotion  Eucerin Original Crme  Eucerin Original Lotion  Eucerin Plus Crme Eucerin Plus Lotion  Eucerin TriLipid Replenishing Lotion  Keri Anti-Bacterial Hand Lotion  Keri Deep Conditioning Original Lotion Dry Skin Formula Softly Scented  Keri Deep Conditioning Original Lotion, Fragrance Free Sensitive Skin Formula  Keri Lotion Fast Absorbing Fragrance Free Sensitive Skin Formula  Keri Lotion Fast Absorbing Softly Scented Dry Skin Formula  Keri Original Lotion  Keri Skin Renewal Lotion Keri Silky Smooth Lotion  Keri Silky Smooth Sensitive Skin Lotion  Nivea Body Creamy Conditioning Oil  Nivea Body Extra Enriched Lotion  Nivea Body Original Lotion  Nivea Body Sheer Moisturizing Lotion Nivea Crme  Nivea Skin Firming Lotion  NutraDerm 30 Skin Lotion  NutraDerm Skin Lotion  NutraDerm Therapeutic Skin Cream  NutraDerm Therapeutic Skin Lotion  ProShield Protective Hand Cream  Provon moisturizing lotion  How to Use an Incentive Spirometer  An incentive spirometer is a tool that measures how well you are filling your lungs with each breath. Learning to take long, deep breaths using this tool can help you keep your lungs clear and active. This may help to reverse or lessen your chance of developing breathing (pulmonary) problems, especially infection. You may be asked to use a spirometer: After a surgery. If you have a lung problem or a history of smoking. After a long period of time when you have been unable to move or be active. If the spirometer includes an indicator to show the highest number that you have reached, your health care provider or respiratory therapist will help you set a goal. Keep a log of your progress as told by your health care provider. What are the risks? Breathing too quickly may cause dizziness or cause you to pass out. Take your time so you do not get dizzy or  light-headed. If you are in pain, you may need to take pain medicine before doing incentive spirometry. It is harder to take a deep breath if you are having pain. How to use your incentive spirometer  Sit up on the edge of your bed or on a chair. Hold the incentive spirometer so that it is in an upright position. Before you use the spirometer, breathe out normally. Place the mouthpiece in your mouth. Make sure your lips are closed tightly around it. Breathe in slowly and as deeply as you can through your mouth, causing the piston or the ball to rise toward the top of the chamber. Hold your breath for 3-5 seconds, or for  as long as possible. If the spirometer includes a coach indicator, use this to guide you in breathing. Slow down your breathing if the indicator goes above the marked areas. Remove the mouthpiece from your mouth and breathe out normally. The piston or ball will return to the bottom of the chamber. Rest for a few seconds, then repeat the steps 10 or more times. Take your time and take a few normal breaths between deep breaths so that you do not get dizzy or light-headed. Do this every 1-2 hours when you are awake. If the spirometer includes a goal marker to show the highest number you have reached (best effort), use this as a goal to work toward during each repetition. After each set of 10 deep breaths, cough a few times. This will help to make sure that your lungs are clear. If you have an incision on your chest or abdomen from surgery, place a pillow or a rolled-up towel firmly against the incision when you cough. This can help to reduce pain while taking deep breaths and coughing. General tips When you are able to get out of bed: Walk around often. Continue to take deep breaths and cough in order to clear your lungs. Keep using the incentive spirometer until your health care provider says it is okay to stop using it. If you have been in the hospital, you may be told to keep  using the spirometer at home. Contact a health care provider if: You are having difficulty using the spirometer. You have trouble using the spirometer as often as instructed. Your pain medicine is not giving enough relief for you to use the spirometer as told. You have a fever. Get help right away if: You develop shortness of breath. You develop a cough with bloody mucus from the lungs. You have fluid or blood coming from an incision site after you cough. Summary An incentive spirometer is a tool that can help you learn to take long, deep breaths to keep your lungs clear and active. You may be asked to use a spirometer after a surgery, if you have a lung problem or a history of smoking, or if you have been inactive for a long period of time. Use your incentive spirometer as instructed every 1-2 hours while you are awake. If you have an incision on your chest or abdomen, place a pillow or a rolled-up towel firmly against your incision when you cough. This will help to reduce pain. Get help right away if you have shortness of breath, you cough up bloody mucus, or blood comes from your incision when you cough. This information is not intended to replace advice given to you by your health care provider. Make sure you discuss any questions you have with your health care provider. Document Revised: 09/01/2019 Document Reviewed: 09/01/2019 Elsevier Patient Education  2023 Elsevier Inc.    Preoperative Educational Videos for Total Hip, Knee and Shoulder Replacements  To better prepare for surgery, please view our videos that explain the physical activity and discharge planning required to have the best surgical recovery at [facility].  [email or web address]  Questions? Call [phone] or email [email or web address]

## 2023-02-07 MED ORDER — CEFAZOLIN SODIUM-DEXTROSE 2-4 GM/100ML-% IV SOLN
2.0000 g | INTRAVENOUS | Status: AC
  Administered 2023-02-08: 2 g via INTRAVENOUS

## 2023-02-07 MED ORDER — LACTATED RINGERS IV SOLN: INTRAVENOUS | Status: DC

## 2023-02-07 MED ORDER — FAMOTIDINE 20 MG PO TABS
20.0000 mg | ORAL_TABLET | Freq: Once | ORAL | Status: AC
  Administered 2023-02-08: 20 mg via ORAL

## 2023-02-07 MED ORDER — CHLORHEXIDINE GLUCONATE 0.12 % MT SOLN
15.0000 mL | Freq: Once | OROMUCOSAL | Status: AC
  Administered 2023-02-08: 15 mL via OROMUCOSAL

## 2023-02-07 MED ORDER — ORAL CARE MOUTH RINSE: 15.0000 mL | Freq: Once | OROMUCOSAL | Status: AC

## 2023-02-08 ENCOUNTER — Other Ambulatory Visit: Payer: Self-pay

## 2023-02-08 ENCOUNTER — Encounter
Admission: RE | Disposition: A | Discharge status: Home Health | Payer: Self-pay | Source: Home / Self Care | Attending: Orthopedic Surgery

## 2023-02-08 ENCOUNTER — Encounter: Payer: Self-pay | Admitting: Orthopedic Surgery

## 2023-02-08 ENCOUNTER — Ambulatory Visit: Payer: Medicare Other | Admitting: Certified Registered"

## 2023-02-08 ENCOUNTER — Encounter (INDEPENDENT_AMBULATORY_CARE_PROVIDER_SITE_OTHER): Payer: Self-pay

## 2023-02-08 ENCOUNTER — Encounter: Payer: Self-pay | Admitting: Internal Medicine

## 2023-02-08 ENCOUNTER — Ambulatory Visit: Payer: Medicare Other

## 2023-02-08 ENCOUNTER — Observation Stay
Admission: RE | Admit: 2023-02-08 | Discharge: 2023-02-09 | Disposition: A | Discharge status: Home Health | Payer: Medicare Other | Attending: Orthopedic Surgery | Admitting: Orthopedic Surgery

## 2023-02-08 DIAGNOSIS — Z79899 Other long term (current) drug therapy: Secondary | ICD-10-CM | POA: Insufficient documentation

## 2023-02-08 DIAGNOSIS — Z96651 Presence of right artificial knee joint: Secondary | ICD-10-CM | POA: Diagnosis not present

## 2023-02-08 DIAGNOSIS — Z01818 Encounter for other preprocedural examination: Principal | ICD-10-CM

## 2023-02-08 DIAGNOSIS — R609 Edema, unspecified: Secondary | ICD-10-CM | POA: Diagnosis not present

## 2023-02-08 DIAGNOSIS — M1711 Unilateral primary osteoarthritis, right knee: Secondary | ICD-10-CM | POA: Diagnosis not present

## 2023-02-08 DIAGNOSIS — M1712 Unilateral primary osteoarthritis, left knee: Secondary | ICD-10-CM | POA: Diagnosis not present

## 2023-02-08 DIAGNOSIS — I10 Essential (primary) hypertension: Secondary | ICD-10-CM | POA: Insufficient documentation

## 2023-02-08 DIAGNOSIS — Z471 Aftercare following joint replacement surgery: Secondary | ICD-10-CM | POA: Diagnosis not present

## 2023-02-08 HISTORY — PX: TOTAL KNEE ARTHROPLASTY: SHX125

## 2023-02-08 LAB — ABO/RH: ABO/RH(D): A POS

## 2023-02-08 SURGERY — ARTHROPLASTY, KNEE, TOTAL
Anesthesia: General | Site: Knee | Laterality: Right

## 2023-02-08 MED ORDER — EPHEDRINE 5 MG/ML INJ
INTRAVENOUS | Status: AC
  Filled 2023-02-08: qty 5

## 2023-02-08 MED ORDER — CHLORHEXIDINE GLUCONATE 0.12 % MT SOLN
OROMUCOSAL | Status: AC
  Filled 2023-02-08: qty 15

## 2023-02-08 MED ORDER — KETOROLAC TROMETHAMINE 15 MG/ML IJ SOLN
INTRAMUSCULAR | Status: AC
  Filled 2023-02-08: qty 1

## 2023-02-08 MED ORDER — PROPOFOL 1000 MG/100ML IV EMUL
INTRAVENOUS | Status: AC
  Filled 2023-02-08: qty 100

## 2023-02-08 MED ORDER — PANTOPRAZOLE SODIUM 40 MG PO TBEC
DELAYED_RELEASE_TABLET | ORAL | Status: AC
  Filled 2023-02-08: qty 1

## 2023-02-08 MED ORDER — ACETAMINOPHEN 10 MG/ML IV SOLN: 1000.0000 mg | Freq: Once | INTRAVENOUS | Status: DC | PRN

## 2023-02-08 MED ORDER — DEXAMETHASONE SODIUM PHOSPHATE 10 MG/ML IJ SOLN
INTRAMUSCULAR | Status: AC
  Filled 2023-02-08: qty 1

## 2023-02-08 MED ORDER — TRANEXAMIC ACID-NACL 1000-0.7 MG/100ML-% IV SOLN
INTRAVENOUS | Status: AC
  Filled 2023-02-08: qty 100

## 2023-02-08 MED ORDER — PHENOL 1.4 % MT LIQD: 1.0000 | OROMUCOSAL | Status: DC | PRN

## 2023-02-08 MED ORDER — KETOROLAC TROMETHAMINE 30 MG/ML IJ SOLN
INTRAMUSCULAR | Status: AC
  Filled 2023-02-08: qty 1

## 2023-02-08 MED ORDER — FAMOTIDINE 20 MG PO TABS
ORAL_TABLET | ORAL | Status: AC
  Filled 2023-02-08: qty 1

## 2023-02-08 MED ORDER — ACETAMINOPHEN 500 MG PO TABS
1000.0000 mg | ORAL_TABLET | Freq: Three times a day (TID) | ORAL | Status: DC
  Administered 2023-02-09: 1000 mg via ORAL

## 2023-02-08 MED ORDER — LOSARTAN POTASSIUM 50 MG PO TABS
50.0000 mg | ORAL_TABLET | Freq: Every day | ORAL | Status: DC
  Administered 2023-02-08 – 2023-02-09 (×2): 50 mg via ORAL

## 2023-02-08 MED ORDER — FENTANYL CITRATE (PF) 100 MCG/2ML IJ SOLN: 25.0000 ug | INTRAMUSCULAR | Status: DC | PRN

## 2023-02-08 MED ORDER — OXYCODONE HCL 5 MG/5ML PO SOLN: 5.0000 mg | Freq: Once | ORAL | Status: DC | PRN

## 2023-02-08 MED ORDER — TRANEXAMIC ACID-NACL 1000-0.7 MG/100ML-% IV SOLN
1000.0000 mg | INTRAVENOUS | Status: AC
  Administered 2023-02-08 (×2): 1000 mg via INTRAVENOUS

## 2023-02-08 MED ORDER — CEFAZOLIN SODIUM-DEXTROSE 2-4 GM/100ML-% IV SOLN
INTRAVENOUS | Status: AC
  Filled 2023-02-08: qty 100

## 2023-02-08 MED ORDER — MORPHINE SULFATE (PF) 4 MG/ML IV SOLN: 0.5000 mg | INTRAVENOUS | Status: DC | PRN

## 2023-02-08 MED ORDER — LIDOCAINE HCL (PF) 2 % IJ SOLN
INTRAMUSCULAR | Status: AC
  Filled 2023-02-08: qty 5

## 2023-02-08 MED ORDER — BUPIVACAINE HCL (PF) 0.5 % IJ SOLN
INTRAMUSCULAR | Status: DC | PRN
  Administered 2023-02-08: 2.4 mL via INTRATHECAL

## 2023-02-08 MED ORDER — LOSARTAN POTASSIUM 50 MG PO TABS
ORAL_TABLET | ORAL | Status: AC
  Filled 2023-02-08: qty 1

## 2023-02-08 MED ORDER — SODIUM CHLORIDE 0.9 % IV SOLN: INTRAVENOUS | Status: DC

## 2023-02-08 MED ORDER — STERILE WATER FOR IRRIGATION IR SOLN
Status: DC | PRN
  Administered 2023-02-08: 1000 mL

## 2023-02-08 MED ORDER — PROPOFOL 10 MG/ML IV BOLUS
INTRAVENOUS | Status: DC | PRN
Start: 2023-02-08 — End: 2023-02-08
  Administered 2023-02-08: 20 mg via INTRAVENOUS
  Administered 2023-02-08: 50 ug/kg/min via INTRAVENOUS

## 2023-02-08 MED ORDER — SURGIPHOR WOUND IRRIGATION SYSTEM - OPTIME: TOPICAL | Status: DC | PRN

## 2023-02-08 MED ORDER — ACETAMINOPHEN 10 MG/ML IV SOLN
INTRAVENOUS | Status: AC
  Filled 2023-02-08: qty 100

## 2023-02-08 MED ORDER — CEFAZOLIN SODIUM-DEXTROSE 2-4 GM/100ML-% IV SOLN
2.0000 g | Freq: Four times a day (QID) | INTRAVENOUS | Status: AC
  Administered 2023-02-08 (×2): 2 g via INTRAVENOUS
  Filled 2023-02-08: qty 100

## 2023-02-08 MED ORDER — DEXAMETHASONE SODIUM PHOSPHATE 10 MG/ML IJ SOLN
8.0000 mg | Freq: Once | INTRAMUSCULAR | Status: AC
  Administered 2023-02-08: 8 mg via INTRAVENOUS

## 2023-02-08 MED ORDER — ONDANSETRON HCL 4 MG/2ML IJ SOLN: 4.0000 mg | Freq: Four times a day (QID) | INTRAMUSCULAR | Status: DC | PRN

## 2023-02-08 MED ORDER — PHENYLEPHRINE HCL-NACL 20-0.9 MG/250ML-% IV SOLN
INTRAVENOUS | Status: AC
  Filled 2023-02-08: qty 250

## 2023-02-08 MED ORDER — DOCUSATE SODIUM 100 MG PO CAPS
ORAL_CAPSULE | ORAL | Status: AC
  Filled 2023-02-08: qty 1

## 2023-02-08 MED ORDER — TRAMADOL HCL 50 MG PO TABS: 50.0000 mg | ORAL_TABLET | Freq: Four times a day (QID) | ORAL | Status: DC | PRN

## 2023-02-08 MED ORDER — METOCLOPRAMIDE HCL 10 MG PO TABS: 5.0000 mg | ORAL_TABLET | Freq: Three times a day (TID) | ORAL | Status: DC | PRN

## 2023-02-08 MED ORDER — KETOROLAC TROMETHAMINE 15 MG/ML IJ SOLN
7.5000 mg | Freq: Four times a day (QID) | INTRAMUSCULAR | Status: DC
  Administered 2023-02-08 – 2023-02-09 (×3): 7.5 mg via INTRAVENOUS

## 2023-02-08 MED ORDER — MENTHOL 3 MG MT LOZG: 1.0000 | LOZENGE | OROMUCOSAL | Status: DC | PRN

## 2023-02-08 MED ORDER — PHENYLEPHRINE HCL-NACL 20-0.9 MG/250ML-% IV SOLN
INTRAVENOUS | Status: DC | PRN
  Administered 2023-02-08: 20 ug/min via INTRAVENOUS

## 2023-02-08 MED ORDER — DOCUSATE SODIUM 100 MG PO CAPS
100.0000 mg | ORAL_CAPSULE | Freq: Two times a day (BID) | ORAL | Status: DC
  Administered 2023-02-08 – 2023-02-09 (×2): 100 mg via ORAL

## 2023-02-08 MED ORDER — ONDANSETRON HCL 4 MG PO TABS: 4.0000 mg | ORAL_TABLET | Freq: Four times a day (QID) | ORAL | Status: DC | PRN

## 2023-02-08 MED ORDER — PHENYLEPHRINE HCL (PRESSORS) 10 MG/ML IV SOLN
INTRAVENOUS | Status: AC
  Filled 2023-02-08: qty 1

## 2023-02-08 MED ORDER — ACETAMINOPHEN 10 MG/ML IV SOLN
INTRAVENOUS | Status: DC | PRN
  Administered 2023-02-08: 1000 mg via INTRAVENOUS

## 2023-02-08 MED ORDER — ONDANSETRON HCL 4 MG/2ML IJ SOLN
INTRAMUSCULAR | Status: DC | PRN
  Administered 2023-02-08: 4 mg via INTRAVENOUS

## 2023-02-08 MED ORDER — MIDAZOLAM HCL 5 MG/5ML IJ SOLN
INTRAMUSCULAR | Status: DC | PRN
  Administered 2023-02-08 (×2): 1 mg via INTRAVENOUS

## 2023-02-08 MED ORDER — PROMETHAZINE HCL 25 MG/ML IJ SOLN: 6.2500 mg | INTRAMUSCULAR | Status: DC | PRN

## 2023-02-08 MED ORDER — SODIUM CHLORIDE 0.9 % IR SOLN
Status: DC | PRN
  Administered 2023-02-08: 3000 mL

## 2023-02-08 MED ORDER — ENOXAPARIN SODIUM 30 MG/0.3ML IJ SOSY
30.0000 mg | PREFILLED_SYRINGE | Freq: Two times a day (BID) | INTRAMUSCULAR | Status: DC
  Administered 2023-02-09: 30 mg via SUBCUTANEOUS

## 2023-02-08 MED ORDER — LIDOCAINE HCL (CARDIAC) PF 100 MG/5ML IV SOSY
PREFILLED_SYRINGE | INTRAVENOUS | Status: DC | PRN
  Administered 2023-02-08: 20 mg via INTRAVENOUS

## 2023-02-08 MED ORDER — EPHEDRINE SULFATE (PRESSORS) 50 MG/ML IJ SOLN
INTRAMUSCULAR | Status: DC | PRN
  Administered 2023-02-08: 5 mg via INTRAVENOUS

## 2023-02-08 MED ORDER — PHENYLEPHRINE HCL (PRESSORS) 10 MG/ML IV SOLN
INTRAVENOUS | Status: DC | PRN
  Administered 2023-02-08 (×2): 80 ug via INTRAVENOUS

## 2023-02-08 MED ORDER — OXYCODONE HCL 5 MG PO TABS: 5.0000 mg | ORAL_TABLET | Freq: Once | ORAL | Status: DC | PRN

## 2023-02-08 MED ORDER — MIDAZOLAM HCL 2 MG/2ML IJ SOLN
INTRAMUSCULAR | Status: AC
  Filled 2023-02-08: qty 2

## 2023-02-08 MED ORDER — HYDROCODONE-ACETAMINOPHEN 5-325 MG PO TABS
1.0000 | ORAL_TABLET | ORAL | Status: DC | PRN
  Administered 2023-02-08: 1 via ORAL

## 2023-02-08 MED ORDER — PANTOPRAZOLE SODIUM 40 MG PO TBEC
40.0000 mg | DELAYED_RELEASE_TABLET | Freq: Every day | ORAL | Status: DC
  Administered 2023-02-08 – 2023-02-09 (×2): 40 mg via ORAL

## 2023-02-08 MED ORDER — ONDANSETRON HCL 4 MG/2ML IJ SOLN
INTRAMUSCULAR | Status: AC
  Filled 2023-02-08: qty 2

## 2023-02-08 MED ORDER — SODIUM CHLORIDE (PF) 0.9 % IJ SOLN
INTRAMUSCULAR | Status: DC | PRN
  Administered 2023-02-08: 71 mL

## 2023-02-08 MED ORDER — DROPERIDOL 2.5 MG/ML IJ SOLN: 0.6250 mg | Freq: Once | INTRAMUSCULAR | Status: DC | PRN

## 2023-02-08 MED ORDER — HYDROCODONE-ACETAMINOPHEN 5-325 MG PO TABS
ORAL_TABLET | ORAL | Status: AC
  Filled 2023-02-08: qty 1

## 2023-02-08 MED ORDER — METOCLOPRAMIDE HCL 5 MG/ML IJ SOLN: 5.0000 mg | Freq: Three times a day (TID) | INTRAMUSCULAR | Status: DC | PRN

## 2023-02-08 SURGICAL SUPPLY — 78 items
ADH SKN CLS APL DERMABOND .7 (GAUZE/BANDAGES/DRESSINGS) ×1
APL PRP STRL LF DISP 70% ISPRP (MISCELLANEOUS) ×2
BLADE PATELLA REAM PILOT HOLE (MISCELLANEOUS) IMPLANT
BLADE SAGITTAL AGGR TOOTH XLG (BLADE) IMPLANT
BLADE SAW SAG 25X90X1.19 (BLADE) ×1 IMPLANT
BLADE SAW SAG 29X58X.64 (BLADE) ×1 IMPLANT
BOWL CEMENT MIX W/ADAPTER (MISCELLANEOUS) ×1 IMPLANT
BSPLAT TIB 5D E CMNT KN RT (Knees) ×1 IMPLANT
CEMENT BONE R 1X40 (Cement) ×2 IMPLANT
CHLORAPREP W/TINT 26 (MISCELLANEOUS) ×2 IMPLANT
COOLER POLAR GLACIER W/PUMP (MISCELLANEOUS) ×1 IMPLANT
CUFF TOURN SGL QUICK 24 (TOURNIQUET CUFF) ×1
CUFF TOURN SGL QUICK 30 (TOURNIQUET CUFF)
CUFF TRNQT CYL 24X4X16.5-23 (TOURNIQUET CUFF) IMPLANT
CUFF TRNQT CYL 30X4X21-28X (TOURNIQUET CUFF) IMPLANT
DERMABOND ADVANCED .7 DNX12 (GAUZE/BANDAGES/DRESSINGS) ×1 IMPLANT
DRAPE INCISE IOBAN 66X60 STRL (DRAPES) IMPLANT
DRAPE SHEET LG 3/4 BI-LAMINATE (DRAPES) ×1 IMPLANT
DRSG MEPILEX SACRM 8.7X9.8 (GAUZE/BANDAGES/DRESSINGS) ×1 IMPLANT
DRSG OPSITE POSTOP 4X10 (GAUZE/BANDAGES/DRESSINGS) IMPLANT
DRSG OPSITE POSTOP 4X8 (GAUZE/BANDAGES/DRESSINGS) IMPLANT
ELECT REM PT RETURN 9FT ADLT (ELECTROSURGICAL) ×1
ELECTRODE REM PT RTRN 9FT ADLT (ELECTROSURGICAL) ×1 IMPLANT
FEMUR CMT CR STD SZ 6 RT KNEE (Joint) ×1 IMPLANT
FEMUR CMTD CR STD SZ 6 RT KNEE (Joint) IMPLANT
GLOVE BIO SURGEON STRL SZ8 (GLOVE) ×1 IMPLANT
GLOVE BIOGEL PI IND STRL 8 (GLOVE) ×1 IMPLANT
GLOVE PI ORTHO PRO STRL 7.5 (GLOVE) ×2 IMPLANT
GLOVE PI ORTHO PRO STRL SZ8 (GLOVE) ×2 IMPLANT
GLOVE SURG SYN 7.5 E (GLOVE) ×1 IMPLANT
GLOVE SURG SYN 7.5 PF PI (GLOVE) ×1 IMPLANT
GOWN SRG XL LVL 3 NONREINFORCE (GOWNS) ×1 IMPLANT
GOWN STRL NON-REIN TWL XL LVL3 (GOWNS) ×1
GOWN STRL REUS W/ TWL LRG LVL3 (GOWN DISPOSABLE) ×1 IMPLANT
GOWN STRL REUS W/ TWL XL LVL3 (GOWN DISPOSABLE) ×1 IMPLANT
GOWN STRL REUS W/TWL LRG LVL3 (GOWN DISPOSABLE) ×1
GOWN STRL REUS W/TWL XL LVL3 (GOWN DISPOSABLE) ×1
HANDLE YANKAUER SUCT OPEN TIP (MISCELLANEOUS) ×1 IMPLANT
HOOD PEEL AWAY T7 (MISCELLANEOUS) ×2 IMPLANT
INSERT TIB ASF SZ 6-7/EF 10 RT (Insert) IMPLANT
IV NS IRRIG 3000ML ARTHROMATIC (IV SOLUTION) ×1 IMPLANT
KIT TURNOVER KIT A (KITS) ×1 IMPLANT
MANIFOLD NEPTUNE II (INSTRUMENTS) ×1 IMPLANT
MARKER SKIN DUAL TIP RULER LAB (MISCELLANEOUS) ×1 IMPLANT
MAT ABSORB FLUID 56X50 GRAY (MISCELLANEOUS) ×1 IMPLANT
NDL FILTER BLUNT 18X1 1/2 (NEEDLE) ×1 IMPLANT
NDL HYPO 21X1.5 SAFETY (NEEDLE) ×1 IMPLANT
NDL SAFETY ECLIP 18X1.5 (MISCELLANEOUS) ×1 IMPLANT
NEEDLE FILTER BLUNT 18X1 1/2 (NEEDLE) ×1 IMPLANT
NEEDLE HYPO 21X1.5 SAFETY (NEEDLE) ×1 IMPLANT
PACK TOTAL KNEE (MISCELLANEOUS) ×1 IMPLANT
PAD ARMBOARD 7.5X6 YLW CONV (MISCELLANEOUS) ×3 IMPLANT
PAD WRAPON POLAR KNEE (MISCELLANEOUS) ×1 IMPLANT
PENCIL SMOKE EVACUATOR (MISCELLANEOUS) ×1 IMPLANT
PIN DRILL HDLS TROCAR 75 4PK (PIN) IMPLANT
PULSAVAC PLUS IRRIG FAN TIP (DISPOSABLE) ×1
SCREW FEMALE HEX FIX 25X2.5 (ORTHOPEDIC DISPOSABLE SUPPLIES) IMPLANT
SCREW HEX HEADED 3.5X27 DISP (ORTHOPEDIC DISPOSABLE SUPPLIES) IMPLANT
SLEEVE SCD COMPRESS KNEE MED (STOCKING) ×1 IMPLANT
SOLUTION IRRIG SURGIPHOR (IV SOLUTION) ×1 IMPLANT
STEM POLY PAT PLY 35M KNEE (Knees) IMPLANT
STEM TIB ST PERS 14+30 (Stem) IMPLANT
STEM TIBIA 5 DEG SZ E R KNEE (Knees) IMPLANT
SUT DVC 2 QUILL PDO T11 36X36 (SUTURE) ×1 IMPLANT
SUT QUILL MONODERM 3-0 PS-2 (SUTURE) ×1 IMPLANT
SUT VIC AB 0 CT1 36 (SUTURE) ×1 IMPLANT
SUT VIC AB 2-0 CT2 27 (SUTURE) ×2 IMPLANT
SUT VICRYL 1-0 27IN ABS (SUTURE) ×1
SUTURE VICRYL 1-0 27IN ABS (SUTURE) ×1 IMPLANT
SYR 30ML LL (SYRINGE) ×2 IMPLANT
SYR TB 1ML LL NO SAFETY (SYRINGE) ×1 IMPLANT
TAPE CLOTH 3X10 WHT NS LF (GAUZE/BANDAGES/DRESSINGS) ×1 IMPLANT
TIBIA STEM 5 DEG SZ E R KNEE (Knees) ×1 IMPLANT
TIP FAN IRRIG PULSAVAC PLUS (DISPOSABLE) ×1 IMPLANT
TOWEL OR 17X26 4PK STRL BLUE (TOWEL DISPOSABLE) IMPLANT
TRAP FLUID SMOKE EVACUATOR (MISCELLANEOUS) ×1 IMPLANT
WATER STERILE IRR 1000ML POUR (IV SOLUTION) ×1 IMPLANT
WRAPON POLAR PAD KNEE (MISCELLANEOUS) ×1

## 2023-02-08 NOTE — Anesthesia Preprocedure Evaluation (Signed)
Anesthesia Evaluation  Patient identified by MRN, date of birth, ID band Patient awake    Reviewed: Allergy & Precautions, H&P , NPO status , Patient's Chart, lab work & pertinent test results, reviewed documented beta blocker date and time   Airway Mallampati: II   Neck ROM: full    Dental  (+) Poor Dentition   Pulmonary neg pulmonary ROS   Pulmonary exam normal        Cardiovascular Exercise Tolerance: Good hypertension, On Medications negative cardio ROS Normal cardiovascular exam Rhythm:regular Rate:Normal     Neuro/Psych  Neuromuscular disease  negative psych ROS   GI/Hepatic Neg liver ROS, hiatal hernia,GERD  Medicated,,  Endo/Other  negative endocrine ROS    Renal/GU negative Renal ROS  negative genitourinary   Musculoskeletal   Abdominal   Peds  Hematology negative hematology ROS (+)   Anesthesia Other Findings Past Medical History: No date: Degenerative joint disease No date: GERD (gastroesophageal reflux disease) No date: History of hiatal hernia No date: Hypercholesterolemia No date: Hypertension Past Surgical History: 06/27/1967: APPENDECTOMY 04/28/2021: COLONOSCOPY WITH PROPOFOL; N/A     Comment:  Procedure: COLONOSCOPY WITH PROPOFOL;  Surgeon: Jaynie Collins, DO;  Location: [facility] ENDOSCOPY;  Service:               Gastroenterology;  Laterality: N/A; No date: ESOPHAGOGASTRODUODENOSCOPY ENDOSCOPY 06/26/1968: TONSILECTOMY/ADENOIDECTOMY WITH MYRINGOTOMY 06/27/1967: TUBAL LIGATION 06/26/1985: VAGINAL HYSTERECTOMY     Comment:  secondary to bleeding   Reproductive/Obstetrics negative OB ROS                             Anesthesia Physical Anesthesia Plan  ASA: 3  Anesthesia Plan: General   Post-op Pain Management:    Induction:   PONV Risk Score and Plan: 4 or greater  Airway Management Planned:   Additional Equipment:   Intra-op Plan:    Post-operative Plan:   Informed Consent: I have reviewed the patients History and Physical, chart, labs and discussed the procedure including the risks, benefits and alternatives for the proposed anesthesia with the patient or authorized representative who has indicated his/her understanding and acceptance.     Dental Advisory Given  Plan Discussed with: CRNA  Anesthesia Plan Comments:        Anesthesia Quick Evaluation

## 2023-02-08 NOTE — H&P (Signed)
History of Present Illness: The patient is an 77 y.o. female seen in clinic today for history and physical for right total knee arthroplasty with Dr. Audelia Acton on 02/08/2023. She has had increasing pain in both knees for greater than 5 years. Pain is severe debilitating and interfering with her quality of life and activities daily living. She has had multiple cortisone injections with little relief. X-rays show advanced degenerative changes throughout the right knee with complete loss of joint space. Her pain is 8 out of 10. She has been taken Tylenol ibuprofen with little relief.  Patient is a non-smoker with a BMI of 25.6 and hemoglobin A1c of 5.9  Past Medical History: Past Medical History:  Diagnosis Date  Hypertension   Past Surgical History: Past Surgical History:  Procedure Laterality Date  COLONOSCOPY 04/28/2021  Tubular adenomas/PHx CP/Repeat 22yrs/SMR  CATARACT EXTRACTION  VAGINAL HYSTERECTOMY   Past Family History: Family History  Problem Relation Age of Onset  High blood pressure (Hypertension) Mother  Heart disease Mother  Diabetes Father  Heart disease Father  High blood pressure (Hypertension) Father  Heart disease Brother  High blood pressure (Hypertension) Brother  Hyperlipidemia (Elevated cholesterol) Brother   Medications: Current Outpatient Medications  Medication Sig Dispense Refill  calcium carbonate 600 mg calcium (1,500 mg) Tab tablet Take 1 tablet by mouth 2 (two) times daily.  cholecalciferol (VITAMIN D3) 1000 unit tablet Take 1 tablet by mouth once daily.  losartan (COZAAR) 100 MG tablet Take 100 mg by mouth once daily   No current facility-administered medications for this visit.   Allergies: Allergies  Allergen Reactions  Statins-Hmg-Coa Reductase Inhibitors Muscle Pain    Visit Vitals: Vitals:  01/26/23 1007  BP: (!) 142/82    Review of Systems:  A comprehensive 14 point ROS was performed, reviewed, and the pertinent orthopaedic findings  are documented in the HPI.  Physical Exam: General:  Well developed, well nourished, no apparent distress, normal affect, normal gait with no antalgic component.   HEENT: Head normocephalic, atraumatic, PERRL.   Abdomen: Soft, non tender, non distended, Bowel sounds present.  Heart: Examination of the heart reveals regular, rate, and rhythm. There is no murmur noted on ascultation. There is a normal apical pulse.  Lungs: Lungs are clear to auscultation. There is no wheeze, rhonchi, or crackles. There is normal expansion of bilateral chest walls.   Comprehensive Knee Exam: Gait Non-antalgic and fluid  Alignment Neutral   Inspection Right Left  Skin Normal appearance with no obvious deformity. No ecchymosis or erythema. Normal appearance with no obvious deformity. No ecchymosis or erythema.  Soft Tissue No focal soft tissue swelling No focal soft tissue swelling  Quad Atrophy None None   Palpation  Right Left  Tenderness Medial joint line and parapatellar tenderness to palpation Medial joint line tenderness to palpation  Crepitus + patellofemoral and tibiofemoral crepitus + patellofemoral and tibiofemoral crepitus  Effusion None None   Range of Motion               Right     Left  Flexion 5-100     0-110  Extension Full knee extension without hyperextension Full knee extension without hyperextension   Ligamentous Exam Right Left  Lachman Normal Normal  Valgus 0 Normal Normal  Valgus 30 Normal Normal  Varus 0 Normal Normal  Varus 30 Normal Normal  Anterior Drawer 2+ with endpoint Normal  Posterior Drawer Normal Normal   Meniscal Exam Right Left  Hyperflexion Test Positive Negative  Hyperextension Test Positive  Positive  McMurray's Negative Negative   Neurovascular Right Left  Quadriceps Strength 5/5 5/5  Hamstring Strength 5/5 5/5  Hip Abductor Strength 4/5 4/5  Distal Motor Normal Normal  Distal Sensory Normal light touch sensation Normal light touch  sensation  Distal Pulses Normal Normal    Imaging Studies: I have reviewed AP, lateral,sunrise, and flexed PA weight bearing knee X-rays (4 views) of the right knee reviewed today in the office show severe degenerative changes with medial patellofemoral joint space narrowing with medial bone-on-bone articulation, osteophyte formation, subchondral cysts and sclerosis. There is lateral subluxation of the tibia relative to the femur. . Kellgren-Lawrence grade 4. AP, sunrise, and flexed PA of the left knee also show medial joint space narrowing bone-on-bone articulation, osteophyte formation, sclerosis, subchondral cyst formation as well as patellofemoral degeneration. Kellgren-Lawrence grade 4. no fractures or dislocations noted in either knee..   Assessment:  ICD-10-CM  1. Primary osteoarthritis of right knee M17.11  2. Primary osteoarthritis of left knee M17.12   Plan: Poleth is a 77 year old female who presents with right knee bone on bone arthritis. She has had greater than 5 years of progressing right knee pain that has interfered with her quality of life and activities of daily living. No relief with conservative treatment. Risks, benefits, complications of a right total knee arthroplasty have been discussed with the patient. Patient has agreed and consented procedure with Dr. Audelia Acton on 02/08/2023.  The hospitalization and post-operative care and rehabilitation were also discussed. The use of perioperative antibiotics and DVT prophylaxis were discussed. The risk, benefits and alternatives to a surgical intervention were discussed at length with the patient. The patient was also advised of risks related to the medical comorbidities and elevated body mass index (BMI). A lengthy discussion took place to review the most common complications including but not limited to: stiffness, loss of function, complex regional pain syndrome, deep vein thrombosis, pulmonary embolus, heart attack, stroke, infection,  wound breakdown, numbness, intraoperative fracture, damage to nerves, tendon,muscles, arteries or other blood vessels, death and other possible complications from anesthesia. The patient was told that we will take steps to minimize these risks by using sterile technique, antibiotics and DVT prophylaxis when appropriate and follow the patient postoperatively in the office setting to monitor progress. The possibility of recurrent pain, no improvement in pain and actual worsening of pain were also discussed with the patient.   Patient received clearance for surgery.

## 2023-02-08 NOTE — Transfer of Care (Signed)
Immediate Anesthesia Transfer of Care Note  Patient: Whitney Carr  Procedure(s) Performed: TOTAL KNEE ARTHROPLASTY (Right: Knee)  Patient Location: PACU  Anesthesia Type:General  Level of Consciousness: awake, alert , and oriented  Airway & Oxygen Therapy: Patient Spontanous Breathing  Post-op Assessment: Report given to RN and Post -op Vital signs reviewed and stable  Post vital signs: Reviewed and stable  Last Vitals:  Vitals Value Taken Time  BP 120/65 02/08/23 1230  Temp 36.1 1230  Pulse 84 02/08/23 1233  Resp 24 02/08/23 1234  SpO2 98 % 02/08/23 1233  Vitals shown include unfiled device data.  Last Pain:  Vitals:   02/08/23 0912  TempSrc: Temporal  PainSc: 0-No pain         Complications: No notable events documented.

## 2023-02-08 NOTE — Interval H&P Note (Signed)
Patient history and physical updated. Consent reviewed including risks, benefits, and alternatives to surgery. Patient agrees with above plan to proceed with right total knee arthroplasty  

## 2023-02-08 NOTE — Op Note (Signed)
Patient Name: Abigeal Poertner  ZOX:[id number]  Pre-Operative Diagnosis: Right knee Osteoarthritis  Post-Operative Diagnosis: (same)  Procedure: Right Total Knee Arthroplasty  Components/Implants: Femur: Persona Size 6 CR   Tibia: Persona Size E w/ 14x24mm stem extension  Poly: 10mm MC  Patella: 35x38mm symmetric  Femoral Valgus Cut Angle: 5 degrees  Distal Femoral Re-cut: none  Patella Resurfacing: yes   Date of Surgery: 02/08/2023  Surgeon: Reinaldo Berber MD  Assistant: Elmarie Shiley RNFA (present and scrubbed throughout the case, critical for assistance with exposure, retraction, instrumentation, and closure)   Anesthesiologist: Adams  Anesthesia: Spinal   Tourniquet Time: 62 min  EBL: 50cc  IVF: 500cc  Complications: None   Brief history: The patient is a 77 year old female with a history of osteoarthritis of the right knee with pain limiting their range of motion and activities of daily living, which has failed multiple attempts at conservative therapy.  The risks and benefits of total knee arthroplasty as definitive surgical treatment were discussed with the patient, who opted to proceed with the operation.  After outpatient medical clearance and optimization was completed the patient was admitted to [facility] for the procedure.  All preoperative films were reviewed and an appropriate surgical plan was made prior to surgery. Preoperative range of motion was 5 to 100 with a 5 degree flexion contracture. The patient was identified as having a varus alignment.   Description of procedure: The patient was brought to the operating room where laterality was confirmed by all those present to be the right side.   Spinal anesthesia was administered and the patient received an intravenous dose of antibiotics for surgical prophylaxis and a dose of tranexamic acid.  Patient is positioned supine on the operating room table with all bony prominences well-padded.  A  well-padded tourniquet was applied to the right thigh.  The knee was then prepped and draped in usual sterile fashion with multiple layers of adhesive and nonadhesive drapes.  All of those present in the operating room participated in a surgical timeout laterality and patient were confirmed.   An Esmarch was wrapped around the extremity and the leg was elevated and the knee flexed.  The tourniquet was inflated to a pressure of 275 mmHg. The Esmarch was removed and the leg was brought down to full extension.  The patella and tibial tubercle identified and outlined using a marking pen and a midline skin incision was made with a knife carried through the subcutaneous tissue down to the extensor retinaculum.  After exposure of the extensor mechanism the medial parapatellar arthrotomy was performed with a scalpel and electrocautery extending down medial and distal to the tibial tubercle taking care to avoid incising the patellar tendon.   A standard medial release was performed over the proximal tibia.  The knee was brought into extension in order to excise the fat pad taking care not to damage the patella tendon.  The superior soft tissue was removed from the anterior surface of the distal femur to visualize for the procedure.  The knee was then brought into flexion with the patella subluxed laterally and subluxing the tibia anteriorly.  The ACL was transected and removed with electrocautery and additional soft tissue was removed from the proximal surface of the tibia to fully expose. The PCL was found to be intact and was preserved.  An extramedullary tibial cutting guide was then applied to the leg with a spring-loaded ankle clamp placed around the distal tibia just above the malleoli the angulation of  the guide was adjusted to give some posterior slope in the tibial resection with an appropriate varus/valgus alignment.  The resection guide was then pinned to the proximal tibia and the proximal tibial surface was  resected with an oscillating saw.  Careful attention was paid to ensure the blade did not disrupt any of the soft tissues including any lateral or medial ligament.  Attention was then turned to the femur, with the knee slightly flexed a opening drill was used to enter the medullary canal of the femur.  After removing the drill marrow was suctioned out to decompress the distal femur.  An intramedullary femoral guide was then inserted into the drill hole and the alignment guide was seated firmly against the distal end of the medial femoral condyle.  The distal femoral cutting guide was then attached and pinned securely to the anterior surface of the femur and the intramedullary rod and alignment guide was removed.  Distal femur resection was then performed with an oscillating saw with retractors protecting medial and laterally.   The distal cutting block was then removed and the extension gap was checked with a spacer.  Extension gap was found to be appropriately sized to accommodate the spacer block.   The femoral sizing guide was then placed securely into the posterior condyles of the femur and the femoral size was measured and determined to be 6.  The size 6; 4-in-1 cutting guide was placed in position and secured with 2 pins.  The anterior posterior and chamfer resections were then performed with an oscillating saw.  Bony fragments and osteophytes were then removed.  Using a lamina spreader the posterior medial and lateral condyles were checked for additional osteophytes and posterior soft tissue remnants.  Any remaining meniscus was removed at this time.  Periarticular injection was performed in the meniscal rims and posterior capsule with aspiration performed to ensure no intravascular injection.   The tibia was then exposed and the tibial trial was pinned onto the plateau after confirming appropriate orientation and rotation.  Using the drill bushing the tibia was prepared to the appropriate drill  depth.  Tibial broach impactor was then driven through the punch guide using a mallet.  The femoral trial component was then inserted onto the femur. A trial tibial polyethylene bearing was then placed and the knee was reduced.  The knee achieved full extension with no hyperextension and was found to be balanced in flexion and extension with the trials in place.  The knee was then brought into full extension the patella was everted and held with 2 Kocher clamps.  The articular surface of the patella was then resected with an patella reamer and saw after careful measurement with a caliper.  The patella was then prepared with the drill guide and a trial patella was placed.  The knee was then taken through range of motion and it was found that the patella articulated appropriately with the trochlea and good patellofemoral motion without subluxation.    The correct final components for implantation were confirmed and opened by the circulator nurse.  The prepared surfaces of the patella femur and tibia were cleaned with pulsatile lavage to remove all blood fat and other material and then the surfaces were dried.  2 bags of cement were mixed under vacuum and the components were cemented into place.  Excess cement was removed with curettes and forceps. A trial polyethylene tibial component was placed and the knee was brought into extension to allow the cement to set.  At this time the periarticular injection cocktail was placed in the soft tissues surrounding the knee.  After full curing of the cement the balance of the knee was checked again and the final polyethylene size was confirmed. The tibial component was irrigated and locking mechanism checked to ensure it was clear of debris. The real polyethylene tibial component was implanted and the knee was brought through a range of motion.   The knee was then irrigated with copious amount of normal saline via pulsatile lavage to remove all loose bodies and other debris.   The knee was then irrigated with surgiphor betadine based wash and reirrigated with saline.  The tourniquet was then dropped and all bleeding vessels were identified and coagulated.  The arthrotomy was approximated with #1 Vicryl and closed with #2 Quill suture.  The knee was brought into slight flexion and the subcutaneous tissues were closed with 0 Vicryl, 2-0 Vicryl and a running subcuticular 3-0 monoderm quil suture.  Skin was then glued with Dermabond.  A sterile adhesive dressing was then placed along with a sequential compression device to the calf, a Ted stocking, and a cryotherapy cuff.   Sponge, needle, and Lap counts were all correct at the end of the case.   The patient was transferred off of the operating room table to a hospital bed, good pulses were found distally on the operative side.  The patient was transferred to the recovery room in stable condition.

## 2023-02-08 NOTE — Anesthesia Procedure Notes (Signed)
Spinal  Patient location during procedure: OR Start time: 02/08/2023 10:12 AM End time: 02/08/2023 10:20 AM Reason for block: surgical anesthesia Staffing Performed: resident/CRNA  Resident/CRNA: Morene Crocker, CRNA Performed by: Morene Crocker, CRNA Authorized by: Yevette Edwards, MD   Preanesthetic Checklist Completed: patient identified, IV checked, site marked, risks and benefits discussed, surgical consent, monitors and equipment checked, pre-op evaluation and timeout performed Spinal Block Patient position: sitting Prep: ChloraPrep Patient monitoring: heart rate, continuous pulse ox and blood pressure Approach: midline Location: L3-4 Injection technique: single-shot Needle Needle type: Introducer and Pencan  Needle gauge: 24 G Needle length: 9 cm Assessment Sensory level: T10 Events: CSF return Additional Notes Negative paresthesia. Negative blood return. Positive free-flowing CSF. Expiration date of kit checked and confirmed. Patient tolerated procedure well, without complications. Successful on first attempt. No complications noted. Pt. Tolerated procedure well without any c/o pain/discomfort.

## 2023-02-09 ENCOUNTER — Encounter: Payer: Self-pay | Admitting: Orthopedic Surgery

## 2023-02-09 DIAGNOSIS — Z79899 Other long term (current) drug therapy: Secondary | ICD-10-CM | POA: Diagnosis not present

## 2023-02-09 DIAGNOSIS — M1711 Unilateral primary osteoarthritis, right knee: Secondary | ICD-10-CM | POA: Diagnosis not present

## 2023-02-09 DIAGNOSIS — I10 Essential (primary) hypertension: Secondary | ICD-10-CM | POA: Diagnosis not present

## 2023-02-09 DIAGNOSIS — M1712 Unilateral primary osteoarthritis, left knee: Secondary | ICD-10-CM | POA: Diagnosis not present

## 2023-02-09 LAB — CBC
HCT: 34.1 % — ABNORMAL LOW (ref 36.0–46.0)
Hemoglobin: 11.5 g/dL — ABNORMAL LOW (ref 12.0–15.0)
MCH: 30.5 pg (ref 26.0–34.0)
MCHC: 33.7 g/dL (ref 30.0–36.0)
MCV: 90.5 fL (ref 80.0–100.0)
Platelets: 230 10*3/uL (ref 150–400)
RBC: 3.77 MIL/uL — ABNORMAL LOW (ref 3.87–5.11)
RDW: 13.2 % (ref 11.5–15.5)
WBC: 16.8 10*3/uL — ABNORMAL HIGH (ref 4.0–10.5)
nRBC: 0 % (ref 0.0–0.2)

## 2023-02-09 LAB — BASIC METABOLIC PANEL
Anion gap: 8 (ref 5–15)
BUN: 18 mg/dL (ref 8–23)
CO2: 24 mmol/L (ref 22–32)
Calcium: 8.9 mg/dL (ref 8.9–10.3)
Chloride: 107 mmol/L (ref 98–111)
Creatinine, Ser: 0.65 mg/dL (ref 0.44–1.00)
GFR, Estimated: >60 mL/min (ref >60)
Glucose, Bld: 122 mg/dL — ABNORMAL HIGH (ref 70–99)
Potassium: 4.1 mmol/L (ref 3.5–5.1)
Sodium: 139 mmol/L (ref 135–145)

## 2023-02-09 MED ORDER — ENOXAPARIN SODIUM 40 MG/0.4ML IJ SOSY: 40.0000 mg | PREFILLED_SYRINGE | INTRAMUSCULAR | 0 refills | Status: DC

## 2023-02-09 MED ORDER — ACETAMINOPHEN 500 MG PO TABS: 1000.0000 mg | ORAL_TABLET | Freq: Three times a day (TID) | ORAL | 0 refills | Status: AC

## 2023-02-09 MED ORDER — DOCUSATE SODIUM 100 MG PO CAPS: 100.0000 mg | ORAL_CAPSULE | Freq: Two times a day (BID) | ORAL | 0 refills | Status: DC

## 2023-02-09 MED ORDER — LOSARTAN POTASSIUM 50 MG PO TABS
ORAL_TABLET | ORAL | Status: AC
  Filled 2023-02-09: qty 1

## 2023-02-09 MED ORDER — KETOROLAC TROMETHAMINE 15 MG/ML IJ SOLN
INTRAMUSCULAR | Status: AC
  Filled 2023-02-09: qty 1

## 2023-02-09 MED ORDER — DOCUSATE SODIUM 100 MG PO CAPS
ORAL_CAPSULE | ORAL | Status: AC
  Filled 2023-02-09: qty 1

## 2023-02-09 MED ORDER — ONDANSETRON HCL 4 MG PO TABS: 4.0000 mg | ORAL_TABLET | Freq: Four times a day (QID) | ORAL | 0 refills | Status: DC | PRN

## 2023-02-09 MED ORDER — TRAMADOL HCL 50 MG PO TABS: 50.0000 mg | ORAL_TABLET | Freq: Four times a day (QID) | ORAL | 0 refills | Status: DC | PRN

## 2023-02-09 MED ORDER — CELECOXIB 200 MG PO CAPS: 200.0000 mg | ORAL_CAPSULE | Freq: Two times a day (BID) | ORAL | 0 refills | Status: AC

## 2023-02-09 MED ORDER — ACETAMINOPHEN 500 MG PO TABS
ORAL_TABLET | ORAL | Status: AC
  Filled 2023-02-09: qty 2

## 2023-02-09 MED ORDER — ENOXAPARIN SODIUM 30 MG/0.3ML IJ SOSY
PREFILLED_SYRINGE | INTRAMUSCULAR | Status: AC
  Filled 2023-02-09: qty 0.3

## 2023-02-09 MED ORDER — PANTOPRAZOLE SODIUM 40 MG PO TBEC
DELAYED_RELEASE_TABLET | ORAL | Status: AC
  Filled 2023-02-09: qty 1

## 2023-02-09 NOTE — Plan of Care (Signed)
Documented

## 2023-02-09 NOTE — TOC CM/SW Note (Addendum)
Patient to DC home today from post op.   RW and 3in1 orders cancelled as family states they have DME at home.  Spoke to daughter Aram Beecham. Patient will be going to [address] [place], [place] [address]. HH agency to call Aram Beecham for scheduling [phone].   Checked with [facility], [facility], and [facility]- they are out of the services area. Adelina Mings with [facility] is able to service [facility], she accepted patient and is aware of DC home today.    Alfonso Ramus, LCSW Transitions of Care Department [phone]

## 2023-02-09 NOTE — Discharge Instructions (Signed)

## 2023-02-09 NOTE — Progress Notes (Signed)
   Subjective: 1 Day Post-Op Procedure(s) (LRB): TOTAL KNEE ARTHROPLASTY (Right) Patient reports pain as mild.   Patient is well, and has had no acute complaints or problems Denies any CP, SOB, ABD pain. We will continue therapy today.  Plan is to go Home after hospital stay.  Objective: Vital signs in last 24 hours: Temp:  [97.6 F (36.4 C)-99 F (37.2 C)] 98.1 F (36.7 C) (08/16 0346) Pulse Rate:  [69-96] 69 (08/16 0346) Resp:  [11-22] 18 (08/16 0346) BP: (124-174)/(58-96) 162/69 (08/16 0346) SpO2:  [97 %-100 %] 99 % (08/16 0346) Weight:  [68.9 kg] 68.9 kg (08/15 1502)  Intake/Output from previous day: 08/15 0701 - 08/16 0700 In: 1630.4 [I.V.:1230.4; IV Piggyback:400] Out: 750 [Urine:700; Blood:50] Intake/Output this shift: No intake/output data recorded.  Recent Labs    02/09/23 0610  HGB 11.5*   Recent Labs    02/09/23 0610  WBC 16.8*  RBC 3.77*  HCT 34.1*  PLT 230   Recent Labs    02/09/23 0610  NA 139  K 4.1  CL 107  CO2 24  BUN 18  CREATININE 0.65  GLUCOSE 122*  CALCIUM 8.9   No results for input(s): "LABPT", "INR" in the last 72 hours.  EXAM General - Patient is Alert, Appropriate, and Oriented Extremity - Neurovascular intact Sensation intact distally Intact pulses distally Dorsiflexion/Plantar flexion intact Dressing - dressing C/D/I and no drainage Motor Function - intact, moving foot and toes well on exam.   Past Medical History:  Diagnosis Date   Degenerative joint disease    GERD (gastroesophageal reflux disease)    History of hiatal hernia    Hypercholesterolemia    Hypertension     Assessment/Plan:   1 Day Post-Op Procedure(s) (LRB): TOTAL KNEE ARTHROPLASTY (Right) Principal Problem:   S/P TKR (total knee replacement) using cement, right  Estimated body mass index is 25.29 kg/m as calculated from the following:   Height as of this encounter: 5\' 5"  (1.651 m).   Weight as of this encounter: 68.9 kg. Advance diet Up  with therapy Pain well-controlled Labs and vital signs are stable Care management to assist with discharge to home with home health PT today pending safe completion of PT goals.  DVT Prophylaxis - Lovenox, TED hose, and SCDs Weight-Bearing as tolerated to right leg   T. Cranston Neighbor, PA-C [facility] 02/09/2023, 7:37 AM

## 2023-02-09 NOTE — Discharge Summary (Signed)
Physician Discharge Summary  Patient ID: Whitney Carr MRN: [id number] DOB/AGE: Sep 15, 1945 77 y.o.  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   Aching 04/15/2018   Nausea 04/12/2018   Aortic atherosclerosis (HCC) 10/30/2017   Nasal congestion 07/22/2017   Light headedness 03/23/2017   Health care maintenance 07/09/2016   Neck pain 11/23/2015   Cough 11/02/2015   SOB (shortness of breath) on exertion 10/26/2015   Chest pain 10/26/2015   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 female who was admitted 02/08/2023 with a diagnosis of S/P TKR (total knee replacement) using cement, right and went to the operating room on 02/08/2023 and underwent the above named procedures.    Surgeries: Procedure(s): TOTAL KNEE ARTHROPLASTY on 02/08/2023 Patient tolerated the surgery well. Taken to PACU where she was stabilized  and then transferred to the orthopedic floor.  Started on Lovenox 30 mg q 12 hrs. TEDs and SCDs applied bilaterally. Heels elevated on bed. No evidence of DVT. Negative Homan. Physical therapy started on day #1 for gait training and transfer. OT started day #1 for ADL and assisted devices.  Patient's IV was d/c on day #1. Patient was able to safely and independently complete all PT goals. PT recommending discharge to home.    On post op day #1 patient was stable and ready for discharge to home with home health PT  Implants: Femur: Persona Size 6 CR   Tibia: Persona Size E w/ 14x67mm stem extension  Poly: 10mm MC  Patella: 35x57mm symmetric .  She was given perioperative antibiotics:  Anti-infectives (From admission, onward)    Start     Dose/Rate Route Frequency Ordered Stop   02/08/23 1700  ceFAZolin (ANCEF) IVPB 2g/100 mL premix        2 g 200 mL/hr over 30 Minutes Intravenous Every 6 hours 02/08/23 1652 02/09/23 0009   02/08/23 0600  ceFAZolin (ANCEF) IVPB 2g/100 mL premix        2 g 200 mL/hr over 30 Minutes Intravenous On call to O.R. 02/07/23 2150 02/08/23 1039     .  She was given sequential compression devices, early ambulation, and Lovenox, teds for DVT prophylaxis.  She benefited maximally from the hospital stay and there were no complications.    Recent vital signs:  Vitals:   02/08/23 2100 02/09/23 0346  BP: (!) 158/66 (!) 162/69  Pulse: 85 69  Resp: 16 18  Temp: 99 F (37.2 C)  98.1 F (36.7 C)  SpO2: 99% 99%    Recent laboratory studies:  Lab Results  Component Value Date   HGB 11.5 (L) 02/09/2023   HGB 14.7 01/30/2023   HGB 13.7 09/04/2022   Lab Results  Component Value Date   WBC 16.8 (H) 02/09/2023   PLT 230 02/09/2023   No results found for: "INR" Lab Results  Component Value Date   NA 139 02/09/2023   K 4.1 02/09/2023   CL 107 02/09/2023   CO2 24 02/09/2023   BUN 18 02/09/2023   CREATININE 0.65 02/09/2023   GLUCOSE 122 (H) 02/09/2023     Discharge Medications:   Allergies as of 02/09/2023   No Known Allergies      Medication List     STOP taking these medications    acetaminophen 650 MG CR tablet Commonly known as: TYLENOL Replaced by: acetaminophen 500 MG tablet   ezetimibe 10 MG tablet Commonly known as: ZETIA       TAKE these medications    acetaminophen 500 MG tablet Commonly known as: TYLENOL Take 2 tablets (1,000 mg total) by mouth every 8 (eight) hours. Replaces: acetaminophen 650 MG CR tablet   Calcium 600-200 MG-UNIT tablet Take 1 tablet by mouth 2 (two) times daily.   celecoxib 200 MG capsule Commonly known as: CeleBREX Take 1 capsule (200 mg total) by mouth 2 (two) times daily for 14 days.   CORICIDIN HBP COLD/FLU PO Take 1 tablet by mouth every 6 (six) hours as needed.   docusate sodium 100 MG capsule Commonly known as: COLACE Take 1 capsule (100 mg total) by mouth 2 (two) times daily.   enoxaparin 40 MG/0.4ML injection Commonly known as: LOVENOX Inject 0.4 mLs (40 mg total) into the skin daily for 14 days.   losartan 50 MG tablet Commonly known as: COZAAR TAKE (2) TABLETS BY MOUTH ONCE DAILY.   ondansetron 4 MG tablet Commonly known as: ZOFRAN Take 1 tablet (4 mg total) by mouth every 6 (six) hours as needed for nausea.   Repatha SureClick 140 MG/ML Soaj Generic drug: Evolocumab INJECT 140MG  SUBCUTANEOUSLY  EVERY 2 WEEKS   traMADol 50 MG tablet Commonly known as: ULTRAM Take 1 tablet (50 mg total) by mouth every 6 (six) hours as needed for moderate pain.   VITAMIN D3 ADULT GUMMIES PO Take 2,000 Units by mouth daily.        Diagnostic Studies: DG Knee Right Port  Result Date: 02/08/2023 CLINICAL DATA:  Status post total knee arthroplasty. EXAM: PORTABLE RIGHT KNEE - 1-2 VIEW COMPARISON:  None Available. FINDINGS: Right knee arthroplasty in expected alignment. No periprosthetic lucency or fracture. There has been patellar resurfacing. Recent postsurgical change  includes air and edema in the soft tissues and joint space. IMPRESSION: Right knee arthroplasty without immediate postoperative complication. Electronically Signed   By: Narda Rutherford M.D.   On: 02/08/2023 13:36    Disposition:      Follow-up Information     Evon Slack, PA-C Follow up in 2 week(s).   Specialties: Orthopedic Surgery, Emergency Medicine Contact information: [address] [place] [address] [phone]                  Signed: Patience Musca 02/09/2023, 7:41 AM

## 2023-02-09 NOTE — Evaluation (Signed)
Physical Therapy Evaluation Patient Details Name: Whitney Carr MRN: [id number] DOB: September 03, 1945 Today's Date: 02/09/2023  History of Present Illness  Patient is s/p R TKA. PMH includes HTN.  Clinical Impression  Patient received in bed, family at bedside. She is ready for PT. Patient is independent with bed mobility and mod I with transfers. Patient ambulated 200 feet with rolling walker and supervision and up/down 4 steps x 2 reps with 1 rail and 2 rails supervision. Patient given handout and reviewed exercises with patient demo and verbalizing understanding.  All questions answered at this time. Patient has met acute goals for discharging home.       If plan is discharge home, recommend the following: A little help with walking and/or transfers;A little help with bathing/dressing/bathroom;Help with stairs or ramp for entrance;Assist for transportation   Can travel by private vehicle        Equipment Recommendations None recommended by PT (patient/daughter report they have needed equipment)  Recommendations for Other Services       Functional Status Assessment Patient has had a recent decline in their functional status and demonstrates the ability to make significant improvements in function in a reasonable and predictable amount of time.     Precautions / Restrictions Precautions Precautions: Fall Restrictions Weight Bearing Restrictions: Yes RLE Weight Bearing: Weight bearing as tolerated      Mobility  Bed Mobility Overal bed mobility: Independent                  Transfers Overall transfer level: Independent Equipment used: None                    Ambulation/Gait Ambulation/Gait assistance: Supervision Gait Distance (Feet): 200 Feet Assistive device: Rolling walker (2 wheels) Gait Pattern/deviations: Step-through pattern Gait velocity: WNL        Stairs Stairs: Yes Stairs assistance: Supervision Stair Management: Two rails, One rail  Right, Step to pattern, Forwards Number of Stairs: 4 General stair comments: performed stair training 1x with B rails and 1x with single right rail. Return demo safe  Wheelchair Mobility     Tilt Bed    Modified Rankin (Stroke Patients Only)       Balance Overall balance assessment: Modified Independent                                           Pertinent Vitals/Pain Pain Assessment Pain Assessment: 0-10 Pain Score: 3  Pain Location: R knee Pain Descriptors / Indicators: Discomfort Pain Intervention(s): Monitored during session, Premedicated before session, Ice applied    Home Living Family/patient expects to be discharged to:: Private residence Living Arrangements: Children;Spouse/significant other Available Help at Discharge: Family;Available 24 hours/day Type of Home: House Home Access: Stairs to enter Entrance Stairs-Rails: Right;Left;Can reach both Entrance Stairs-Number of Steps: 3   Home Layout: One level Home Equipment: Agricultural consultant (2 wheels);Shower seat;Hand held shower head;BSC/3in1      Prior Function Prior Level of Function : Independent/Modified Independent;Driving                     Extremity/Trunk Assessment   Upper Extremity Assessment Upper Extremity Assessment: Overall WFL for tasks assessed    Lower Extremity Assessment Lower Extremity Assessment: RLE deficits/detail RLE Deficits / Details: s/p TKA RLE Coordination: decreased gross motor    Cervical / Trunk Assessment Cervical / Trunk Assessment: Normal  Communication   Communication Communication: No apparent difficulties Cueing Techniques: Verbal cues  Cognition Arousal: Alert Behavior During Therapy: WFL for tasks assessed/performed Overall Cognitive Status: Within Functional Limits for tasks assessed                                          General Comments      Exercises Other Exercises Other Exercises: Reviewed HEP and packet  given to patient . She verbalized and demonstrates independence. ROM 0-100 grossly   Assessment/Plan    PT Assessment Patient needs continued PT services  PT Problem List Decreased strength;Decreased range of motion;Decreased activity tolerance;Decreased balance;Pain;Decreased mobility;Decreased knowledge of use of DME       PT Treatment Interventions DME instruction;Gait training;Stair training;Functional mobility training;Therapeutic activities;Patient/family education;Balance training;Therapeutic exercise;Neuromuscular re-education;Modalities    PT Goals (Current goals can be found in the Care Plan section)  Acute Rehab PT Goals Patient Stated Goal: to go home today. PT Goal Formulation: With patient/family Time For Goal Achievement: 02/10/23 Potential to Achieve Goals: Good    Frequency BID     Co-evaluation               AM-PAC PT "6 Clicks" Mobility  Outcome Measure Help needed turning from your back to your side while in a flat bed without using bedrails?: None Help needed moving from lying on your back to sitting on the side of a flat bed without using bedrails?: None Help needed moving to and from a bed to a chair (including a wheelchair)?: A Little Help needed standing up from a chair using your arms (e.g., wheelchair or bedside chair)?: None Help needed to walk in hospital room?: A Little Help needed climbing 3-5 steps with a railing? : A Little 6 Click Score: 21    End of Session Equipment Utilized During Treatment: Gait belt Activity Tolerance: Patient tolerated treatment well Patient left: in bed;with call bell/phone within reach;with family/visitor present Nurse Communication: Mobility status PT Visit Diagnosis: Other abnormalities of gait and mobility (R26.89);Muscle weakness (generalized) (M62.81);Pain Pain - Right/Left: Right Pain - part of body: Knee    Time: 1914-7829 PT Time Calculation (min) (ACUTE ONLY): 18 min   Charges:   PT  Evaluation $PT Eval Moderate Complexity: 1 Mod PT Treatments $Gait Training: 8-22 mins PT General Charges $$ ACUTE PT VISIT: 1 Visit         Usher Hedberg, PT, GCS 02/09/23,9:56 AM

## 2023-02-11 DIAGNOSIS — K219 Gastro-esophageal reflux disease without esophagitis: Secondary | ICD-10-CM | POA: Diagnosis not present

## 2023-02-11 DIAGNOSIS — G720 Drug-induced myopathy: Secondary | ICD-10-CM | POA: Diagnosis not present

## 2023-02-11 DIAGNOSIS — Z791 Long term (current) use of non-steroidal anti-inflammatories (NSAID): Secondary | ICD-10-CM | POA: Diagnosis not present

## 2023-02-11 DIAGNOSIS — Z96651 Presence of right artificial knee joint: Secondary | ICD-10-CM | POA: Diagnosis not present

## 2023-02-11 DIAGNOSIS — I10 Essential (primary) hypertension: Secondary | ICD-10-CM | POA: Diagnosis not present

## 2023-02-11 DIAGNOSIS — K449 Diaphragmatic hernia without obstruction or gangrene: Secondary | ICD-10-CM | POA: Diagnosis not present

## 2023-02-11 DIAGNOSIS — I8390 Asymptomatic varicose veins of unspecified lower extremity: Secondary | ICD-10-CM | POA: Diagnosis not present

## 2023-02-11 DIAGNOSIS — G458 Other transient cerebral ischemic attacks and related syndromes: Secondary | ICD-10-CM | POA: Diagnosis not present

## 2023-02-11 DIAGNOSIS — Z86010 Personal history of colonic polyps: Secondary | ICD-10-CM | POA: Diagnosis not present

## 2023-02-11 DIAGNOSIS — M810 Age-related osteoporosis without current pathological fracture: Secondary | ICD-10-CM | POA: Diagnosis not present

## 2023-02-11 DIAGNOSIS — M26609 Unspecified temporomandibular joint disorder, unspecified side: Secondary | ICD-10-CM | POA: Diagnosis not present

## 2023-02-11 DIAGNOSIS — E7800 Pure hypercholesterolemia, unspecified: Secondary | ICD-10-CM | POA: Diagnosis not present

## 2023-02-11 DIAGNOSIS — Z471 Aftercare following joint replacement surgery: Secondary | ICD-10-CM | POA: Diagnosis not present

## 2023-02-11 DIAGNOSIS — Z7901 Long term (current) use of anticoagulants: Secondary | ICD-10-CM | POA: Diagnosis not present

## 2023-02-11 DIAGNOSIS — M542 Cervicalgia: Secondary | ICD-10-CM | POA: Diagnosis not present

## 2023-02-11 DIAGNOSIS — I700 Atherosclerosis of aorta: Secondary | ICD-10-CM | POA: Diagnosis not present

## 2023-02-12 NOTE — Anesthesia Postprocedure Evaluation (Signed)
Anesthesia Post Note  Patient: Martie Dingus Monger  Procedure(s) Performed: TOTAL KNEE ARTHROPLASTY (Right: Knee)  Patient location during evaluation: PACU Anesthesia Type: General Level of consciousness: awake and alert Pain management: pain level controlled Vital Signs Assessment: post-procedure vital signs reviewed and stable Respiratory status: spontaneous breathing, nonlabored ventilation, respiratory function stable and patient connected to nasal cannula oxygen Cardiovascular status: blood pressure returned to baseline and stable Postop Assessment: no apparent nausea or vomiting Anesthetic complications: no   No notable events documented.   Last Vitals:  Vitals:   02/09/23 0346 02/09/23 0744  BP: (!) 162/69 (!) 145/65  Pulse: 69 65  Resp: 18 18  Temp: 36.7 C 36.6 C  SpO2: 99% 99%    Last Pain:  Vitals:   02/09/23 0744  TempSrc: Oral  PainSc: 3                  Yevette Edwards

## 2023-02-13 DIAGNOSIS — I8390 Asymptomatic varicose veins of unspecified lower extremity: Secondary | ICD-10-CM | POA: Diagnosis not present

## 2023-02-13 DIAGNOSIS — K219 Gastro-esophageal reflux disease without esophagitis: Secondary | ICD-10-CM | POA: Diagnosis not present

## 2023-02-13 DIAGNOSIS — E7800 Pure hypercholesterolemia, unspecified: Secondary | ICD-10-CM | POA: Diagnosis not present

## 2023-02-13 DIAGNOSIS — G458 Other transient cerebral ischemic attacks and related syndromes: Secondary | ICD-10-CM | POA: Diagnosis not present

## 2023-02-13 DIAGNOSIS — Z791 Long term (current) use of non-steroidal anti-inflammatories (NSAID): Secondary | ICD-10-CM | POA: Diagnosis not present

## 2023-02-13 DIAGNOSIS — G720 Drug-induced myopathy: Secondary | ICD-10-CM | POA: Diagnosis not present

## 2023-02-13 DIAGNOSIS — Z7901 Long term (current) use of anticoagulants: Secondary | ICD-10-CM | POA: Diagnosis not present

## 2023-02-13 DIAGNOSIS — M542 Cervicalgia: Secondary | ICD-10-CM | POA: Diagnosis not present

## 2023-02-13 DIAGNOSIS — K449 Diaphragmatic hernia without obstruction or gangrene: Secondary | ICD-10-CM | POA: Diagnosis not present

## 2023-02-13 DIAGNOSIS — I10 Essential (primary) hypertension: Secondary | ICD-10-CM | POA: Diagnosis not present

## 2023-02-13 DIAGNOSIS — M810 Age-related osteoporosis without current pathological fracture: Secondary | ICD-10-CM | POA: Diagnosis not present

## 2023-02-13 DIAGNOSIS — I700 Atherosclerosis of aorta: Secondary | ICD-10-CM | POA: Diagnosis not present

## 2023-02-13 DIAGNOSIS — M26609 Unspecified temporomandibular joint disorder, unspecified side: Secondary | ICD-10-CM | POA: Diagnosis not present

## 2023-02-13 DIAGNOSIS — Z471 Aftercare following joint replacement surgery: Secondary | ICD-10-CM | POA: Diagnosis not present

## 2023-02-13 DIAGNOSIS — Z86010 Personal history of colonic polyps: Secondary | ICD-10-CM | POA: Diagnosis not present

## 2023-02-13 DIAGNOSIS — Z96651 Presence of right artificial knee joint: Secondary | ICD-10-CM | POA: Diagnosis not present

## 2023-02-14 DIAGNOSIS — M810 Age-related osteoporosis without current pathological fracture: Secondary | ICD-10-CM | POA: Diagnosis not present

## 2023-02-14 DIAGNOSIS — I8390 Asymptomatic varicose veins of unspecified lower extremity: Secondary | ICD-10-CM | POA: Diagnosis not present

## 2023-02-14 DIAGNOSIS — I10 Essential (primary) hypertension: Secondary | ICD-10-CM | POA: Diagnosis not present

## 2023-02-14 DIAGNOSIS — Z471 Aftercare following joint replacement surgery: Secondary | ICD-10-CM | POA: Diagnosis not present

## 2023-02-14 DIAGNOSIS — K449 Diaphragmatic hernia without obstruction or gangrene: Secondary | ICD-10-CM | POA: Diagnosis not present

## 2023-02-14 DIAGNOSIS — M26609 Unspecified temporomandibular joint disorder, unspecified side: Secondary | ICD-10-CM | POA: Diagnosis not present

## 2023-02-14 DIAGNOSIS — Z96651 Presence of right artificial knee joint: Secondary | ICD-10-CM | POA: Diagnosis not present

## 2023-02-14 DIAGNOSIS — I700 Atherosclerosis of aorta: Secondary | ICD-10-CM | POA: Diagnosis not present

## 2023-02-14 DIAGNOSIS — K219 Gastro-esophageal reflux disease without esophagitis: Secondary | ICD-10-CM | POA: Diagnosis not present

## 2023-02-14 DIAGNOSIS — Z791 Long term (current) use of non-steroidal anti-inflammatories (NSAID): Secondary | ICD-10-CM | POA: Diagnosis not present

## 2023-02-14 DIAGNOSIS — M542 Cervicalgia: Secondary | ICD-10-CM | POA: Diagnosis not present

## 2023-02-14 DIAGNOSIS — G458 Other transient cerebral ischemic attacks and related syndromes: Secondary | ICD-10-CM | POA: Diagnosis not present

## 2023-02-14 DIAGNOSIS — G720 Drug-induced myopathy: Secondary | ICD-10-CM | POA: Diagnosis not present

## 2023-02-14 DIAGNOSIS — Z86010 Personal history of colonic polyps: Secondary | ICD-10-CM | POA: Diagnosis not present

## 2023-02-14 DIAGNOSIS — E7800 Pure hypercholesterolemia, unspecified: Secondary | ICD-10-CM | POA: Diagnosis not present

## 2023-02-14 DIAGNOSIS — Z7901 Long term (current) use of anticoagulants: Secondary | ICD-10-CM | POA: Diagnosis not present

## 2023-02-15 DIAGNOSIS — K219 Gastro-esophageal reflux disease without esophagitis: Secondary | ICD-10-CM | POA: Diagnosis not present

## 2023-02-15 DIAGNOSIS — I8390 Asymptomatic varicose veins of unspecified lower extremity: Secondary | ICD-10-CM | POA: Diagnosis not present

## 2023-02-15 DIAGNOSIS — Z86010 Personal history of colonic polyps: Secondary | ICD-10-CM | POA: Diagnosis not present

## 2023-02-15 DIAGNOSIS — I700 Atherosclerosis of aorta: Secondary | ICD-10-CM | POA: Diagnosis not present

## 2023-02-15 DIAGNOSIS — K449 Diaphragmatic hernia without obstruction or gangrene: Secondary | ICD-10-CM | POA: Diagnosis not present

## 2023-02-15 DIAGNOSIS — G720 Drug-induced myopathy: Secondary | ICD-10-CM | POA: Diagnosis not present

## 2023-02-15 DIAGNOSIS — I10 Essential (primary) hypertension: Secondary | ICD-10-CM | POA: Diagnosis not present

## 2023-02-15 DIAGNOSIS — E7800 Pure hypercholesterolemia, unspecified: Secondary | ICD-10-CM | POA: Diagnosis not present

## 2023-02-15 DIAGNOSIS — M810 Age-related osteoporosis without current pathological fracture: Secondary | ICD-10-CM | POA: Diagnosis not present

## 2023-02-15 DIAGNOSIS — Z791 Long term (current) use of non-steroidal anti-inflammatories (NSAID): Secondary | ICD-10-CM | POA: Diagnosis not present

## 2023-02-15 DIAGNOSIS — M542 Cervicalgia: Secondary | ICD-10-CM | POA: Diagnosis not present

## 2023-02-15 DIAGNOSIS — Z471 Aftercare following joint replacement surgery: Secondary | ICD-10-CM | POA: Diagnosis not present

## 2023-02-15 DIAGNOSIS — G458 Other transient cerebral ischemic attacks and related syndromes: Secondary | ICD-10-CM | POA: Diagnosis not present

## 2023-02-15 DIAGNOSIS — M26609 Unspecified temporomandibular joint disorder, unspecified side: Secondary | ICD-10-CM | POA: Diagnosis not present

## 2023-02-15 DIAGNOSIS — Z7901 Long term (current) use of anticoagulants: Secondary | ICD-10-CM | POA: Diagnosis not present

## 2023-02-15 DIAGNOSIS — Z96651 Presence of right artificial knee joint: Secondary | ICD-10-CM | POA: Diagnosis not present

## 2023-02-19 DIAGNOSIS — Z791 Long term (current) use of non-steroidal anti-inflammatories (NSAID): Secondary | ICD-10-CM | POA: Diagnosis not present

## 2023-02-19 DIAGNOSIS — Z471 Aftercare following joint replacement surgery: Secondary | ICD-10-CM | POA: Diagnosis not present

## 2023-02-19 DIAGNOSIS — M26609 Unspecified temporomandibular joint disorder, unspecified side: Secondary | ICD-10-CM | POA: Diagnosis not present

## 2023-02-19 DIAGNOSIS — K219 Gastro-esophageal reflux disease without esophagitis: Secondary | ICD-10-CM | POA: Diagnosis not present

## 2023-02-19 DIAGNOSIS — K449 Diaphragmatic hernia without obstruction or gangrene: Secondary | ICD-10-CM | POA: Diagnosis not present

## 2023-02-19 DIAGNOSIS — M542 Cervicalgia: Secondary | ICD-10-CM | POA: Diagnosis not present

## 2023-02-19 DIAGNOSIS — Z7901 Long term (current) use of anticoagulants: Secondary | ICD-10-CM | POA: Diagnosis not present

## 2023-02-19 DIAGNOSIS — G458 Other transient cerebral ischemic attacks and related syndromes: Secondary | ICD-10-CM | POA: Diagnosis not present

## 2023-02-19 DIAGNOSIS — Z96651 Presence of right artificial knee joint: Secondary | ICD-10-CM | POA: Diagnosis not present

## 2023-02-19 DIAGNOSIS — M810 Age-related osteoporosis without current pathological fracture: Secondary | ICD-10-CM | POA: Diagnosis not present

## 2023-02-19 DIAGNOSIS — I700 Atherosclerosis of aorta: Secondary | ICD-10-CM | POA: Diagnosis not present

## 2023-02-19 DIAGNOSIS — G720 Drug-induced myopathy: Secondary | ICD-10-CM | POA: Diagnosis not present

## 2023-02-19 DIAGNOSIS — E7800 Pure hypercholesterolemia, unspecified: Secondary | ICD-10-CM | POA: Diagnosis not present

## 2023-02-19 DIAGNOSIS — I8390 Asymptomatic varicose veins of unspecified lower extremity: Secondary | ICD-10-CM | POA: Diagnosis not present

## 2023-02-19 DIAGNOSIS — Z86010 Personal history of colonic polyps: Secondary | ICD-10-CM | POA: Diagnosis not present

## 2023-02-19 DIAGNOSIS — I10 Essential (primary) hypertension: Secondary | ICD-10-CM | POA: Diagnosis not present

## 2023-02-20 DIAGNOSIS — K219 Gastro-esophageal reflux disease without esophagitis: Secondary | ICD-10-CM | POA: Diagnosis not present

## 2023-02-20 DIAGNOSIS — Z86010 Personal history of colonic polyps: Secondary | ICD-10-CM | POA: Diagnosis not present

## 2023-02-20 DIAGNOSIS — G458 Other transient cerebral ischemic attacks and related syndromes: Secondary | ICD-10-CM | POA: Diagnosis not present

## 2023-02-20 DIAGNOSIS — Z96651 Presence of right artificial knee joint: Secondary | ICD-10-CM | POA: Diagnosis not present

## 2023-02-20 DIAGNOSIS — Z471 Aftercare following joint replacement surgery: Secondary | ICD-10-CM | POA: Diagnosis not present

## 2023-02-20 DIAGNOSIS — M542 Cervicalgia: Secondary | ICD-10-CM | POA: Diagnosis not present

## 2023-02-20 DIAGNOSIS — I10 Essential (primary) hypertension: Secondary | ICD-10-CM | POA: Diagnosis not present

## 2023-02-20 DIAGNOSIS — M810 Age-related osteoporosis without current pathological fracture: Secondary | ICD-10-CM | POA: Diagnosis not present

## 2023-02-20 DIAGNOSIS — K449 Diaphragmatic hernia without obstruction or gangrene: Secondary | ICD-10-CM | POA: Diagnosis not present

## 2023-02-20 DIAGNOSIS — G720 Drug-induced myopathy: Secondary | ICD-10-CM | POA: Diagnosis not present

## 2023-02-20 DIAGNOSIS — Z791 Long term (current) use of non-steroidal anti-inflammatories (NSAID): Secondary | ICD-10-CM | POA: Diagnosis not present

## 2023-02-20 DIAGNOSIS — M26609 Unspecified temporomandibular joint disorder, unspecified side: Secondary | ICD-10-CM | POA: Diagnosis not present

## 2023-02-20 DIAGNOSIS — Z7901 Long term (current) use of anticoagulants: Secondary | ICD-10-CM | POA: Diagnosis not present

## 2023-02-20 DIAGNOSIS — I700 Atherosclerosis of aorta: Secondary | ICD-10-CM | POA: Diagnosis not present

## 2023-02-20 DIAGNOSIS — I8390 Asymptomatic varicose veins of unspecified lower extremity: Secondary | ICD-10-CM | POA: Diagnosis not present

## 2023-02-20 DIAGNOSIS — E7800 Pure hypercholesterolemia, unspecified: Secondary | ICD-10-CM | POA: Diagnosis not present

## 2023-02-22 DIAGNOSIS — Z791 Long term (current) use of non-steroidal anti-inflammatories (NSAID): Secondary | ICD-10-CM | POA: Diagnosis not present

## 2023-02-22 DIAGNOSIS — Z96651 Presence of right artificial knee joint: Secondary | ICD-10-CM | POA: Diagnosis not present

## 2023-02-22 DIAGNOSIS — I700 Atherosclerosis of aorta: Secondary | ICD-10-CM | POA: Diagnosis not present

## 2023-02-22 DIAGNOSIS — I10 Essential (primary) hypertension: Secondary | ICD-10-CM | POA: Diagnosis not present

## 2023-02-22 DIAGNOSIS — M26609 Unspecified temporomandibular joint disorder, unspecified side: Secondary | ICD-10-CM | POA: Diagnosis not present

## 2023-02-22 DIAGNOSIS — I8390 Asymptomatic varicose veins of unspecified lower extremity: Secondary | ICD-10-CM | POA: Diagnosis not present

## 2023-02-22 DIAGNOSIS — M542 Cervicalgia: Secondary | ICD-10-CM | POA: Diagnosis not present

## 2023-02-22 DIAGNOSIS — Z86010 Personal history of colonic polyps: Secondary | ICD-10-CM | POA: Diagnosis not present

## 2023-02-22 DIAGNOSIS — G720 Drug-induced myopathy: Secondary | ICD-10-CM | POA: Diagnosis not present

## 2023-02-22 DIAGNOSIS — K219 Gastro-esophageal reflux disease without esophagitis: Secondary | ICD-10-CM | POA: Diagnosis not present

## 2023-02-22 DIAGNOSIS — G458 Other transient cerebral ischemic attacks and related syndromes: Secondary | ICD-10-CM | POA: Diagnosis not present

## 2023-02-22 DIAGNOSIS — M810 Age-related osteoporosis without current pathological fracture: Secondary | ICD-10-CM | POA: Diagnosis not present

## 2023-02-22 DIAGNOSIS — Z471 Aftercare following joint replacement surgery: Secondary | ICD-10-CM | POA: Diagnosis not present

## 2023-02-22 DIAGNOSIS — K449 Diaphragmatic hernia without obstruction or gangrene: Secondary | ICD-10-CM | POA: Diagnosis not present

## 2023-02-22 DIAGNOSIS — E7800 Pure hypercholesterolemia, unspecified: Secondary | ICD-10-CM | POA: Diagnosis not present

## 2023-02-22 DIAGNOSIS — Z7901 Long term (current) use of anticoagulants: Secondary | ICD-10-CM | POA: Diagnosis not present

## 2023-02-23 ENCOUNTER — Other Ambulatory Visit: Payer: Medicare Other

## 2023-02-23 DIAGNOSIS — M25561 Pain in right knee: Secondary | ICD-10-CM | POA: Diagnosis not present

## 2023-02-23 DIAGNOSIS — Z96651 Presence of right artificial knee joint: Secondary | ICD-10-CM | POA: Diagnosis not present

## 2023-02-27 ENCOUNTER — Encounter: Payer: Medicare Other | Admitting: Internal Medicine

## 2023-02-27 DIAGNOSIS — M25561 Pain in right knee: Secondary | ICD-10-CM | POA: Diagnosis not present

## 2023-02-27 DIAGNOSIS — Z96651 Presence of right artificial knee joint: Secondary | ICD-10-CM | POA: Diagnosis not present

## 2023-03-02 DIAGNOSIS — M25561 Pain in right knee: Secondary | ICD-10-CM | POA: Diagnosis not present

## 2023-03-02 DIAGNOSIS — Z96651 Presence of right artificial knee joint: Secondary | ICD-10-CM | POA: Diagnosis not present

## 2023-03-06 DIAGNOSIS — Z96651 Presence of right artificial knee joint: Secondary | ICD-10-CM | POA: Diagnosis not present

## 2023-03-06 DIAGNOSIS — M25561 Pain in right knee: Secondary | ICD-10-CM | POA: Diagnosis not present

## 2023-03-08 DIAGNOSIS — M25561 Pain in right knee: Secondary | ICD-10-CM | POA: Diagnosis not present

## 2023-03-08 DIAGNOSIS — Z96651 Presence of right artificial knee joint: Secondary | ICD-10-CM | POA: Diagnosis not present

## 2023-03-13 DIAGNOSIS — M25561 Pain in right knee: Secondary | ICD-10-CM | POA: Diagnosis not present

## 2023-03-13 DIAGNOSIS — Z96651 Presence of right artificial knee joint: Secondary | ICD-10-CM | POA: Diagnosis not present

## 2023-03-16 DIAGNOSIS — M25561 Pain in right knee: Secondary | ICD-10-CM | POA: Diagnosis not present

## 2023-03-16 DIAGNOSIS — Z96651 Presence of right artificial knee joint: Secondary | ICD-10-CM | POA: Diagnosis not present

## 2023-03-20 DIAGNOSIS — M25561 Pain in right knee: Secondary | ICD-10-CM | POA: Diagnosis not present

## 2023-03-20 DIAGNOSIS — Z96651 Presence of right artificial knee joint: Secondary | ICD-10-CM | POA: Diagnosis not present

## 2023-03-22 DIAGNOSIS — M25561 Pain in right knee: Secondary | ICD-10-CM | POA: Diagnosis not present

## 2023-03-22 DIAGNOSIS — Z96651 Presence of right artificial knee joint: Secondary | ICD-10-CM | POA: Diagnosis not present

## 2023-03-23 DIAGNOSIS — Z96651 Presence of right artificial knee joint: Secondary | ICD-10-CM | POA: Diagnosis not present

## 2023-04-17 ENCOUNTER — Ambulatory Visit (INDEPENDENT_AMBULATORY_CARE_PROVIDER_SITE_OTHER): Payer: Medicare Other

## 2023-04-17 ENCOUNTER — Other Ambulatory Visit: Payer: Self-pay | Admitting: Internal Medicine

## 2023-04-17 VITALS — Ht 65.0 in | Wt 150.0 lb

## 2023-04-17 DIAGNOSIS — Z0000 Encounter for general adult medical examination without abnormal findings: Secondary | ICD-10-CM

## 2023-04-17 DIAGNOSIS — Z780 Asymptomatic menopausal state: Secondary | ICD-10-CM | POA: Diagnosis not present

## 2023-04-17 DIAGNOSIS — Z1231 Encounter for screening mammogram for malignant neoplasm of breast: Secondary | ICD-10-CM

## 2023-04-17 NOTE — Progress Notes (Signed)
Subjective:   Whitney Carr is a 77 y.o. female who presents for Medicare Annual (Subsequent) preventive examination.  Visit Complete: Virtual I connected with  Whitney Carr on 04/17/23 by a audio enabled telemedicine application and verified that I am speaking with the correct person using two identifiers.  Patient Location: Home  Provider Location: Office/Clinic  I discussed the limitations of evaluation and management by telemedicine. The patient expressed understanding and agreed to proceed.  Vital Signs: Because this visit was a virtual/telehealth visit, some criteria may be missing or patient reported. Any vitals not documented were not able to be obtained and vitals that have been documented are patient reported.  Cardiac Risk Factors include: advanced age (>59men, >52 women);dyslipidemia;hypertension     Objective:    Today's Vitals   04/17/23 1504 04/17/23 1505  Weight: 150 lb (68 kg)   Height: 5\' 5"  (1.651 m)   PainSc:  4    Body mass index is 24.96 kg/m.     04/17/2023    3:19 PM 02/08/2023    9:09 AM 01/30/2023   10:26 AM 04/13/2022    2:58 PM 04/28/2021    7:54 AM 04/12/2021    9:14 AM 04/09/2020    9:14 AM  Advanced Directives  Does Patient Have a Medical Advance Directive? Yes Yes Yes Yes Yes Yes Yes  Type of Estate agent of [place];Living will Healthcare Power of [place];Living will  Healthcare Power of [place];Living will  Healthcare Power of [place];Living will Living will;Healthcare Power of Attorney  Does patient want to make changes to medical advance directive?  No - Patient declined No - Patient declined No - Patient declined  No - Patient declined   Copy of Healthcare Power of Attorney in Chart? No - copy requested No - copy requested  No - copy requested  No - copy requested No - copy requested    Current Medications (verified) Outpatient Encounter Medications as of 04/17/2023  Medication Sig   acetaminophen  (TYLENOL) 500 MG tablet Take 2 tablets (1,000 mg total) by mouth every 8 (eight) hours.   Calcium 600-200 MG-UNIT per tablet Take 1 tablet by mouth 2 (two) times daily.   Chlorpheniramine-Acetaminophen (CORICIDIN HBP COLD/FLU PO) Take 1 tablet by mouth every 6 (six) hours as needed.   Cholecalciferol (VITAMIN D3 ADULT GUMMIES PO) Take 2,000 Units by mouth daily.   docusate sodium (COLACE) 100 MG capsule Take 1 capsule (100 mg total) by mouth 2 (two) times daily.   losartan (COZAAR) 50 MG tablet TAKE (2) TABLETS BY MOUTH ONCE DAILY.   ondansetron (ZOFRAN) 4 MG tablet Take 1 tablet (4 mg total) by mouth every 6 (six) hours as needed for nausea. (Patient not taking: Reported on 04/17/2023)   REPATHA SURECLICK 140 MG/ML SOAJ INJECT 140MG  SUBCUTANEOUSLY  EVERY 2 WEEKS (Patient not taking: Reported on 04/17/2023)   traMADol (ULTRAM) 50 MG tablet Take 1 tablet (50 mg total) by mouth every 6 (six) hours as needed for moderate pain. (Patient not taking: Reported on 04/17/2023)   [DISCONTINUED] enoxaparin (LOVENOX) 40 MG/0.4ML injection Inject 0.4 mLs (40 mg total) into the skin daily for 14 days.   No facility-administered encounter medications on file as of 04/17/2023.    Allergies (verified) Patient has no known allergies.   History: Past Medical History:  Diagnosis Date   Degenerative joint disease    GERD (gastroesophageal reflux disease)    History of hiatal hernia    Hypercholesterolemia    Hypertension  Past Surgical History:  Procedure Laterality Date   APPENDECTOMY  06/27/1967   COLONOSCOPY WITH PROPOFOL N/A 04/28/2021   Procedure: COLONOSCOPY WITH PROPOFOL;  Surgeon: Jaynie Collins, DO;  Location: [facility] ENDOSCOPY;  Service: Gastroenterology;  Laterality: N/A;   ESOPHAGOGASTRODUODENOSCOPY ENDOSCOPY     TONSILECTOMY/ADENOIDECTOMY WITH MYRINGOTOMY  06/26/1968   TOTAL KNEE ARTHROPLASTY Right 02/08/2023   Procedure: TOTAL KNEE ARTHROPLASTY;  Surgeon: Reinaldo Berber, MD;   Location: [facility] ORS;  Service: Orthopedics;  Laterality: Right;   TUBAL LIGATION  06/27/1967   VAGINAL HYSTERECTOMY  06/26/1985   secondary to bleeding   Family History  Problem Relation Age of Onset   Congestive Heart Failure Mother    Thyroid disease Mother    Heart failure Father    Diabetes Father    Heart disease Sister        CABG   Alzheimer's disease Sister    Diabetes Sister    Diabetes Sister    Stroke Brother    COPD Brother    COPD Brother    Colon cancer Brother    Hypertension Brother    Heart disease Brother    Breast cancer Daughter    Autoimmune disease Daughter    Social History   Socioeconomic History   Marital status: Married    Spouse name: Not on file   Number of children: 3   Years of education: Not on file   Highest education level: Not on file  Occupational History   Not on file  Tobacco Use   Smoking status: Never   Smokeless tobacco: Never  Vaping Use   Vaping status: Never Used  Substance and Sexual Activity   Alcohol use: No    Alcohol/week: 0.0 standard drinks of alcohol   Drug use: No   Sexual activity: Yes  Other Topics Concern   Not on file  Social History Narrative   Married   Social Determinants of Health   Financial Resource Strain: Low Risk  (04/17/2023)   Overall Financial Resource Strain (CARDIA)    Difficulty of Paying Living Expenses: Not hard at all  Food Insecurity: No Food Insecurity (04/17/2023)   Hunger Vital Sign    Worried About Running Out of Food in the Last Year: Never true    Ran Out of Food in the Last Year: Never true  Transportation Needs: No Transportation Needs (04/17/2023)   PRAPARE - Administrator, Civil Service (Medical): No    Lack of Transportation (Non-Medical): No  Physical Activity: Sufficiently Active (04/17/2023)   Exercise Vital Sign    Days of Exercise per Week: 6 days    Minutes of Exercise per Session: 40 min  Stress: No Stress Concern Present (04/17/2023)   [employer] of Occupational Health - Occupational Stress Questionnaire    Feeling of Stress : Not at all  Social Connections: Moderately Isolated (04/17/2023)   Social Connection and Isolation Panel [NHANES]    Frequency of Communication with Friends and Family: More than three times a week    Frequency of Social Gatherings with Friends and Family: Once a week    Attends Religious Services: Never    Database administrator or Organizations: No    Attends Engineer, structural: Never    Marital Status: Married    Tobacco Counseling Counseling given: Not Answered   Clinical Intake:  Pre-visit preparation completed: Yes  Pain : 0-10 Pain Score: 4  Pain Type: Chronic pain Pain Location: Leg Pain Orientation: Right, Left  Pain Descriptors / Indicators: Aching Pain Onset: More than a month ago Pain Frequency: Intermittent     BMI - recorded: 24.96 Nutritional Status: BMI of 19-24  Normal Nutritional Risks: None Diabetes: No  How often do you need to have someone help you when you read instructions, pamphlets, or other written materials from your doctor or pharmacy?: 1 - Never  Interpreter Needed?: No  Information entered by :: R. Aceson Labell LPN   Activities of Daily Living    04/17/2023    3:08 PM 02/08/2023    2:25 PM  In your present state of health, do you have any difficulty performing the following activities:  Hearing? 0 0  Vision? 0 0  Comment readers   Difficulty concentrating or making decisions? 0 0  Walking or climbing stairs? 0 0  Dressing or bathing? 0 0  Doing errands, shopping? 0 0  Preparing Food and eating ? N   Using the Toilet? N   In the past six months, have you accidently leaked urine? N   Do you have problems with loss of bowel control? N   Managing your Medications? N   Managing your Finances? N   Housekeeping or managing your Housekeeping? N     Patient Care Team: Dale Thornhill, MD as PCP - General (Internal Medicine)  Indicate any  recent Medical Services you may have received from other than [facility] providers in the past year (date may be approximate).     Assessment:   This is a routine wellness examination for [facility]  Hearing/Vision screen Hearing Screening - Comments:: No issues Vision Screening - Comments:: readers   Goals Addressed             This Visit's Progress    Patient Stated       Wants to continue to exercise        Depression Screen    04/17/2023    3:13 PM 06/06/2022    9:38 AM 04/25/2022    7:15 AM 04/13/2022    2:52 PM 12/23/2021    2:59 PM 04/12/2021    9:23 AM 04/09/2020    9:13 AM  PHQ 2/9 Scores  PHQ - 2 Score 0 0 0 0 0 0 0  PHQ- 9 Score 0          Fall Risk    04/17/2023    3:09 PM 06/06/2022    9:38 AM 04/25/2022    7:15 AM 04/13/2022    2:56 PM 12/23/2021    2:59 PM  Fall Risk   Falls in the past year? 0 0 0 0 0  Number falls in past yr: 0 0 0 0 0  Injury with Fall? 0 0 0 0 0  Risk for fall due to : No Fall Risks No Fall Risks No Fall Risks No Fall Risks No Fall Risks  Follow up Falls prevention discussed;Falls evaluation completed Falls evaluation completed Falls evaluation completed Falls evaluation completed Falls evaluation completed    MEDICARE RISK AT HOME: Medicare Risk at Home Any stairs in or around the home?: Yes If so, are there any without handrails?: No Home free of loose throw rugs in walkways, pet beds, electrical cords, etc?: Yes Adequate lighting in your home to reduce risk of falls?: Yes Life alert?: No Use of a cane, walker or w/c?: No Grab bars in the bathroom?: Yes Shower chair or bench in shower?: No Elevated toilet seat or a handicapped toilet?: No      Cognitive Function:  04/03/2018    2:15 PM 04/02/2017    2:28 PM  MMSE - Mini Mental State Exam  Orientation to time 5 5  Orientation to Place 5 5  Registration 3 3  Attention/ Calculation 5 5  Recall 3 3  Language- name 2 objects 2 2  Language- repeat 1 1  Language-  follow 3 step command 3 3  Language- read & follow direction 1 1  Write a sentence 1 1  Copy design 1 1  Total score 30 30        04/17/2023    3:20 PM 04/13/2022    2:56 PM 04/12/2021    9:20 AM 04/09/2020    9:31 AM 04/09/2019    9:21 AM  6CIT Screen  What Year? 0 points 0 points 0 points 0 points 0 points  What month? 0 points 0 points 0 points 0 points 0 points  What time? 0 points 0 points 0 points 0 points 0 points  Count back from 20 0 points 0 points 0 points  0 points  Months in reverse 0 points 0 points 0 points  0 points  Repeat phrase 0 points 0 points 0 points  0 points  Total Score 0 points 0 points 0 points  0 points    Immunizations Immunization History  Administered Date(s) Administered   Fluad Quad(high Dose 65+) 04/10/2019, 03/15/2021, 04/25/2022   Influenza Split 05/06/2012   Influenza, High Dose Seasonal PF 03/23/2015, 03/01/2016, 04/02/2017, 03/01/2018   Influenza,inj,Quad PF,6+ Mos 03/09/2014   Influenza-Unspecified 02/29/2016   Moderna Sars-Covid-2 Vaccination 07/28/2019, 08/26/2019, 04/24/2020   Pneumococcal Conjugate-13 01/20/2014   Pneumococcal Polysaccharide-23 05/08/2012   Tdap 01/31/2017   Zoster Recombinant(Shingrix) 01/31/2017, 05/15/2017    TDAP status: Up to date  Flu Vaccine status: Due, Education has been provided regarding the importance of this vaccine. Advised may receive this vaccine at local pharmacy or Health Dept. Aware to provide a copy of the vaccination record if obtained from local pharmacy or Health Dept. Verbalized acceptance and understanding.  Pneumococcal vaccine status: Up to date  Covid-19 vaccine status: Information provided on how to obtain vaccines.   Qualifies for Shingles Vaccine? Yes   Zostavax completed No   Shingrix Completed?: Yes  Screening Tests Health Maintenance  Topic Date Due   INFLUENZA VACCINE  01/25/2023   COVID-19 Vaccine (4 - 2023-24 season) 02/25/2023   Medicare Annual Wellness (AWV)   04/14/2023   MAMMOGRAM  05/31/2023   DTaP/Tdap/Td (2 - Td or Tdap) 02/01/2027   Pneumonia Vaccine 27+ Years old  Completed   DEXA SCAN  Completed   Hepatitis C Screening  Completed   Zoster Vaccines- Shingrix  Completed   HPV VACCINES  Aged Out   Colonoscopy  Discontinued    Health Maintenance  Health Maintenance Due  Topic Date Due   INFLUENZA VACCINE  01/25/2023   COVID-19 Vaccine (4 - 2023-24 season) 02/25/2023   Medicare Annual Wellness (AWV)  04/14/2023    Colorectal cancer screening: Type of screening: Colonoscopy. Completed 04/2021. Repeat every 3 years  Mammogram status: Completed 05/2022. Repeat every year Appointment scheduled  Bone Density status: Completed 10/2018. Results reflect: Bone density results: OSTEOPOROSIS. Repeat every 2 years. Order placed today   Lung Cancer Screening: (Low Dose CT Chest recommended if Age 35-80 years, 20 pack-year currently smoking OR have quit w/in 15years.) does not qualify.     Additional Screening:  Hepatitis C Screening: does qualify; Completed 04/2017  Vision Screening: Recommended annual ophthalmology exams for early  detection of glaucoma and other disorders of the eye. Is the patient up to date with their annual eye exam?  Yes  Who is the provider or what is the name of the office in which the patient attends annual eye exams? [facility] [place] If pt is not established with a provider, would they like to be referred to a provider to establish care? No .   Dental Screening: Recommended annual dental exams for proper oral hygiene    Community Resource Referral / Chronic Care Management: CRR required this visit?  No   CCM required this visit?  No     Plan:     I have personally reviewed and noted the following in the patient's chart:   Medical and social history Use of alcohol, tobacco or illicit drugs  Current medications and supplements including opioid prescriptions. Patient is not currently taking  opioid prescriptions. Functional ability and status Nutritional status Physical activity Advanced directives List of other physicians Hospitalizations, surgeries, and ER visits in previous 12 months Vitals Screenings to include cognitive, depression, and falls Referrals and appointments  In addition, I have reviewed and discussed with patient certain preventive protocols, quality metrics, and best practice recommendations. A written personalized care plan for preventive services as well as general preventive health recommendations were provided to patient.     Sydell Axon, LPN   [id number]   After Visit Summary: (MyChart) Due to this being a telephonic visit, the after visit summary with patients personalized plan was offered to patient via MyChart   Nurse Notes: None

## 2023-04-17 NOTE — Patient Instructions (Addendum)
Whitney Carr , Thank you for taking time to come for your Medicare Wellness Visit. I appreciate your ongoing commitment to your health goals. Please review the following plan we discussed and let me know if I can assist you in the future.   Referrals/Orders/Follow-Ups/Clinician Recommendations: Remember to update your vaccines, Bone Density/Dexa You have an order for:  []   2D Mammogram  []   3D Mammogram  [x]   Bone Density     Please call for appointment:  [facility]  [address] [place] [address] [phone]     Make sure to wear two-piece clothing.  No lotions, powders, or deodorants the day of the appointment. Make sure to bring picture ID and insurance card.  Bring list of medications you are currently taking including any supplements.   Schedule your [place] screening mammogram through MyChart!   Log into your MyChart account.  Go to 'Visit' (or 'Appointments' if on mobile App) --> Schedule an Appointment  Under 'Select a Reason for Visit' choose the Mammogram Screening option.  Complete the pre-visit questions and select the time and place that best fits your schedule.      This is a list of the screening recommended for you and due dates:  Health Maintenance  Topic Date Due   Flu Shot  01/25/2023   COVID-19 Vaccine (4 - 2023-24 season) 02/25/2023   Mammogram  05/31/2023   Medicare Annual Wellness Visit  04/16/2024   Colon Cancer Screening  04/28/2024   DTaP/Tdap/Td vaccine (2 - Td or Tdap) 02/01/2027   Pneumonia Vaccine  Completed   DEXA scan (bone density measurement)  Completed   Hepatitis C Screening  Completed   Zoster (Shingles) Vaccine  Completed   HPV Vaccine  Aged Out    Advanced directives: (Copy Requested) Please bring a copy of your health care power of attorney and living will to the office to be added to your chart at your convenience.  Next Medicare Annual Wellness Visit scheduled  for next year: Yes 04/22/24 @ 8:15

## 2023-04-26 ENCOUNTER — Ambulatory Visit (INDEPENDENT_AMBULATORY_CARE_PROVIDER_SITE_OTHER): Payer: Medicare Other

## 2023-04-26 DIAGNOSIS — Z23 Encounter for immunization: Secondary | ICD-10-CM | POA: Diagnosis not present

## 2023-06-05 DIAGNOSIS — M1712 Unilateral primary osteoarthritis, left knee: Secondary | ICD-10-CM | POA: Diagnosis not present

## 2023-06-05 DIAGNOSIS — Z96651 Presence of right artificial knee joint: Secondary | ICD-10-CM | POA: Diagnosis not present

## 2023-06-06 ENCOUNTER — Ambulatory Visit
Admission: RE | Admit: 2023-06-06 | Discharge: 2023-06-06 | Disposition: A | Discharge status: Home / Self Care | Payer: Medicare Other | Source: Ambulatory Visit | Attending: Internal Medicine | Admitting: Internal Medicine

## 2023-06-06 DIAGNOSIS — Z1231 Encounter for screening mammogram for malignant neoplasm of breast: Secondary | ICD-10-CM | POA: Insufficient documentation

## 2023-06-08 ENCOUNTER — Encounter: Payer: Self-pay | Admitting: Internal Medicine

## 2023-06-08 ENCOUNTER — Ambulatory Visit (INDEPENDENT_AMBULATORY_CARE_PROVIDER_SITE_OTHER): Payer: Medicare Other | Admitting: Internal Medicine

## 2023-06-08 VITALS — BP 140/82 | HR 79 | Temp 97.8°F | Ht 65.0 in | Wt 154.4 lb

## 2023-06-08 DIAGNOSIS — G458 Other transient cerebral ischemic attacks and related syndromes: Secondary | ICD-10-CM | POA: Diagnosis not present

## 2023-06-08 DIAGNOSIS — Z0000 Encounter for general adult medical examination without abnormal findings: Secondary | ICD-10-CM | POA: Diagnosis not present

## 2023-06-08 DIAGNOSIS — E7800 Pure hypercholesterolemia, unspecified: Secondary | ICD-10-CM | POA: Diagnosis not present

## 2023-06-08 DIAGNOSIS — M170 Bilateral primary osteoarthritis of knee: Secondary | ICD-10-CM

## 2023-06-08 DIAGNOSIS — G720 Drug-induced myopathy: Secondary | ICD-10-CM

## 2023-06-08 DIAGNOSIS — R739 Hyperglycemia, unspecified: Secondary | ICD-10-CM | POA: Diagnosis not present

## 2023-06-08 DIAGNOSIS — I10 Essential (primary) hypertension: Secondary | ICD-10-CM

## 2023-06-08 DIAGNOSIS — T466X5A Adverse effect of antihyperlipidemic and antiarteriosclerotic drugs, initial encounter: Secondary | ICD-10-CM

## 2023-06-08 DIAGNOSIS — I700 Atherosclerosis of aorta: Secondary | ICD-10-CM

## 2023-06-08 MED ORDER — LOSARTAN POTASSIUM 50 MG PO TABS: 100.0000 mg | ORAL_TABLET | Freq: Every day | ORAL | 1 refills | Status: DC

## 2023-06-08 NOTE — Progress Notes (Unsigned)
Subjective:    Patient ID: Whitney Carr, female    DOB: 12/23/1945, 77 y.o.   MRN: [id number]  Patient here for  Chief Complaint  Patient presents with   Annual Exam    HPI Here for a physical exam. S/p recent total knee replacement. S/p PT. Had f/u with ortho 06/05/23. Right knee doing well. Left knee pain now. S/p injection. Stays active. Desires no further intervention for left knee. No chest pain or sob reported. No abdominal pain or bowel change reported.    Past Medical History:  Diagnosis Date   Degenerative joint disease    GERD (gastroesophageal reflux disease)    History of hiatal hernia    Hypercholesterolemia    Hypertension    Past Surgical History:  Procedure Laterality Date   APPENDECTOMY  06/27/1967   COLONOSCOPY WITH PROPOFOL N/A 04/28/2021   Procedure: COLONOSCOPY WITH PROPOFOL;  Surgeon: Jaynie Collins, DO;  Location: [facility] ENDOSCOPY;  Service: Gastroenterology;  Laterality: N/A;   ESOPHAGOGASTRODUODENOSCOPY ENDOSCOPY     TONSILECTOMY/ADENOIDECTOMY WITH MYRINGOTOMY  06/26/1968   TOTAL KNEE ARTHROPLASTY Right 02/08/2023   Procedure: TOTAL KNEE ARTHROPLASTY;  Surgeon: Reinaldo Berber, MD;  Location: [facility] ORS;  Service: Orthopedics;  Laterality: Right;   TUBAL LIGATION  06/27/1967   VAGINAL HYSTERECTOMY  06/26/1985   secondary to bleeding   Family History  Problem Relation Age of Onset   Congestive Heart Failure Mother    Thyroid disease Mother    Heart failure Father    Diabetes Father    Heart disease Sister        CABG   Alzheimer's disease Sister    Diabetes Sister    Diabetes Sister    Stroke Brother    COPD Brother    COPD Brother    Colon cancer Brother    Hypertension Brother    Heart disease Brother    Breast cancer Daughter    Autoimmune disease Daughter    Social History   Socioeconomic History   Marital status: Married    Spouse name: Not on file   Number of children: 3   Years of education: Not on file   Highest  education level: Not on file  Occupational History   Not on file  Tobacco Use   Smoking status: Never   Smokeless tobacco: Never  Vaping Use   Vaping status: Never Used  Substance and Sexual Activity   Alcohol use: No    Alcohol/week: 0.0 standard drinks of alcohol   Drug use: No   Sexual activity: Yes  Other Topics Concern   Not on file  Social History Narrative   Married   Social Drivers of Health   Financial Resource Strain: Low Risk  (04/17/2023)   Overall Financial Resource Strain (CARDIA)    Difficulty of Paying Living Expenses: Not hard at all  Food Insecurity: No Food Insecurity (04/17/2023)   Hunger Vital Sign    Worried About Running Out of Food in the Last Year: Never true    Ran Out of Food in the Last Year: Never true  Transportation Needs: No Transportation Needs (04/17/2023)   PRAPARE - Administrator, Civil Service (Medical): No    Lack of Transportation (Non-Medical): No  Physical Activity: Sufficiently Active (04/17/2023)   Exercise Vital Sign    Days of Exercise per Week: 6 days    Minutes of Exercise per Session: 40 min  Stress: No Stress Concern Present (04/17/2023)   [employer] of Occupational  Health - Occupational Stress Questionnaire    Feeling of Stress : Not at all  Social Connections: Moderately Isolated (04/17/2023)   Social Connection and Isolation Panel [NHANES]    Frequency of Communication with Friends and Family: More than three times a week    Frequency of Social Gatherings with Friends and Family: Once a week    Attends Religious Services: Never    Database administrator or Organizations: No    Attends Banker Meetings: Never    Marital Status: Married     Review of Systems  Constitutional:  Negative for appetite change and unexpected weight change.  HENT:  Negative for congestion, sinus pressure and sore throat.   Eyes:  Negative for pain and visual disturbance.  Respiratory:  Negative for cough,  chest tightness and shortness of breath.   Cardiovascular:  Negative for chest pain, palpitations and leg swelling.  Gastrointestinal:  Negative for abdominal pain, diarrhea, nausea and vomiting.  Genitourinary:  Negative for difficulty urinating and dysuria.  Musculoskeletal:  Negative for myalgias.       Left knee pain as outlined. Right knee doing well.   Skin:  Negative for color change and rash.  Neurological:  Negative for dizziness and headaches.  Hematological:  Negative for adenopathy. Does not bruise/bleed easily.  Psychiatric/Behavioral:  Negative for agitation and dysphoric mood.        Objective:     BP 132/82   Pulse 79   Temp 97.8 F (36.6 C)   Ht 5\' 5"  (1.651 m)   Wt 154 lb 6.4 oz (70 kg)   LMP 06/03/1986   SpO2 95%   BMI 25.69 kg/m  Wt Readings from Last 3 Encounters:  06/08/23 154 lb 6.4 oz (70 kg)  04/17/23 150 lb (68 kg)  02/08/23 152 lb (68.9 kg)    Physical Exam Vitals reviewed.  Constitutional:      General: She is not in acute distress.    Appearance: Normal appearance. She is well-developed.  HENT:     Head: Normocephalic and atraumatic.     Right Ear: External ear normal.     Left Ear: External ear normal.  Eyes:     General: No scleral icterus.       Right eye: No discharge.        Left eye: No discharge.     Conjunctiva/sclera: Conjunctivae normal.  Neck:     Thyroid: No thyromegaly.  Cardiovascular:     Rate and Rhythm: Normal rate and regular rhythm.  Pulmonary:     Effort: No tachypnea, accessory muscle usage or respiratory distress.     Breath sounds: Normal breath sounds. No decreased breath sounds or wheezing.  Chest:  Breasts:    Right: No inverted nipple, mass, nipple discharge or tenderness (no axillary adenopathy).     Left: No inverted nipple, mass, nipple discharge or tenderness (no axilarry adenopathy).  Abdominal:     General: Bowel sounds are normal.     Palpations: Abdomen is soft.     Tenderness: There is no  abdominal tenderness.  Musculoskeletal:        General: No swelling or tenderness.     Cervical back: Neck supple.  Lymphadenopathy:     Cervical: No cervical adenopathy.  Skin:    Findings: No erythema or rash.  Neurological:     Mental Status: She is alert and oriented to person, place, and time.  Psychiatric:        Mood and Affect:  Mood normal.        Behavior: Behavior normal.      Outpatient Encounter Medications as of 06/08/2023  Medication Sig   acetaminophen (TYLENOL) 500 MG tablet Take 2 tablets (1,000 mg total) by mouth every 8 (eight) hours.   Calcium 600-200 MG-UNIT per tablet Take 1 tablet by mouth 2 (two) times daily.   losartan (COZAAR) 50 MG tablet Take 2 tablets (100 mg total) by mouth daily.   REPATHA SURECLICK 140 MG/ML SOAJ INJECT 140MG  SUBCUTANEOUSLY  EVERY 2 WEEKS (Patient not taking: No sig reported)   [DISCONTINUED] Chlorpheniramine-Acetaminophen (CORICIDIN HBP COLD/FLU PO) Take 1 tablet by mouth every 6 (six) hours as needed.   [DISCONTINUED] Cholecalciferol (VITAMIN D3 ADULT GUMMIES PO) Take 2,000 Units by mouth daily.   [DISCONTINUED] docusate sodium (COLACE) 100 MG capsule Take 1 capsule (100 mg total) by mouth 2 (two) times daily.   [DISCONTINUED] losartan (COZAAR) 50 MG tablet TAKE (2) TABLETS BY MOUTH ONCE DAILY.   [DISCONTINUED] ondansetron (ZOFRAN) 4 MG tablet Take 1 tablet (4 mg total) by mouth every 6 (six) hours as needed for nausea. (Patient not taking: Reported on 04/17/2023)   [DISCONTINUED] traMADol (ULTRAM) 50 MG tablet Take 1 tablet (50 mg total) by mouth every 6 (six) hours as needed for moderate pain. (Patient not taking: Reported on 04/17/2023)   No facility-administered encounter medications on file as of 06/08/2023.     Lab Results  Component Value Date   WBC 16.8 (H) 02/09/2023   HGB 11.5 (L) 02/09/2023   HCT 34.1 (L) 02/09/2023   PLT 230 02/09/2023   GLUCOSE 122 (H) 02/09/2023   CHOL 176 09/04/2022   TRIG 102.0 09/04/2022    HDL 63.70 09/04/2022   LDLDIRECT 143.6 05/13/2012   LDLCALC 92 09/04/2022   ALT 13 01/30/2023   AST 13 (L) 01/30/2023   NA 139 02/09/2023   K 4.1 02/09/2023   CL 107 02/09/2023   CREATININE 0.65 02/09/2023   BUN 18 02/09/2023   CO2 24 02/09/2023   TSH 1.19 04/20/2022   HGBA1C 5.9 09/04/2022    MM 3D SCREENING MAMMOGRAM BILATERAL BREAST Result Date: 06/07/2023 CLINICAL DATA:  Screening. EXAM: DIGITAL SCREENING BILATERAL MAMMOGRAM WITH TOMOSYNTHESIS AND CAD TECHNIQUE: Bilateral screening digital craniocaudal and mediolateral oblique mammograms were obtained. Bilateral screening digital breast tomosynthesis was performed. The images were evaluated with computer-aided detection. COMPARISON:  Previous exam(s). ACR Breast Density Category b: There are scattered areas of fibroglandular density. FINDINGS: There are no findings suspicious for malignancy. IMPRESSION: No mammographic evidence of malignancy. A result letter of this screening mammogram will be mailed directly to the patient. RECOMMENDATION: Screening mammogram in one year. (Code:SM-B-01Y) BI-RADS CATEGORY  1: Negative. Electronically Signed   By: Frederico Hamman M.D.   On: 06/07/2023 17:26       Assessment & Plan:  Routine general medical examination at a health care facility  Aortic atherosclerosis [facility]) Assessment & Plan: intolerant to statin medication. Continue low cholesterol diet and exercise.  Follow lipid panel.    Health care maintenance Assessment & Plan: Physical today 06/08/23.  Mammogram 06/06/23 - Birads I.  Colonoscopy 04/2021.     Hypercholesteremia Assessment & Plan: The 10-year ASCVD risk score (Arnett DK, et al., 2019) is: 30.2%   Values used to calculate the score:     Age: 62 years     Sex: Female     Is Non-Hispanic African American: No     Diabetic: No     Tobacco smoker: No  Systolic Blood Pressure: 140 mmHg     Is BP treated: Yes     HDL Cholesterol: 63.7 mg/dL     Total Cholesterol: 176  mg/dL  Intolerant to statin medication.  Repatha. Follow lipid panel.  Low cholesterol diet and exercise.     Hyperglycemia Assessment & Plan: Low carb diet and exercise.  Follow met b and a1c.   Lab Results  Component Value Date   HGBA1C 5.9 09/04/2022     Primary hypertension Assessment & Plan: On losartan 100mg  per day.  Pressures as outlined.  Continue current medication. Follow pressures.  Follow metabolic panel.     Osteoarthritis of both knees, unspecified osteoarthritis type Assessment & Plan: S/p right knee surgery. Doing well. Left knee pain - s/p injection. Continue f/u with ortho.    Statin myopathy Assessment & Plan: Had significant muscle aches with statin medication.     Subclavian steal syndrome Assessment & Plan: S/p revascularization.  Continue risk factor modification.  Has previously seen [facility].     Other orders -     Losartan Potassium; Take 2 tablets (100 mg total) by mouth daily.  Dispense: 180 tablet; Refill: 1     Dale [place], MD

## 2023-06-10 ENCOUNTER — Encounter: Payer: Self-pay | Admitting: Internal Medicine

## 2023-06-10 NOTE — Assessment & Plan Note (Signed)
The 10-year ASCVD risk score (Arnett DK, et al., 2019) is: 30.2%   Values used to calculate the score:     Age: 77 years     Sex: Female     Is Non-Hispanic African American: No     Diabetic: No     Tobacco smoker: No     Systolic Blood Pressure: 140 mmHg     Is BP treated: Yes     HDL Cholesterol: 63.7 mg/dL     Total Cholesterol: 176 mg/dL  Intolerant to statin medication.  Repatha. Follow lipid panel.  Low cholesterol diet and exercise.

## 2023-06-10 NOTE — Assessment & Plan Note (Signed)
On losartan 100mg  per day.  Pressures as outlined.  Continue current medication. Follow pressures.  Follow metabolic panel.

## 2023-06-10 NOTE — Assessment & Plan Note (Signed)
Physical today 06/08/23.  Mammogram 06/06/23 - Birads I.  Colonoscopy 04/2021.

## 2023-06-10 NOTE — Assessment & Plan Note (Signed)
Low carb diet and exercise.  Follow met b and a1c.   Lab Results  Component Value Date   HGBA1C 5.9 09/04/2022

## 2023-06-10 NOTE — Assessment & Plan Note (Signed)
S/p right knee surgery. Doing well. Left knee pain - s/p injection. Continue f/u with ortho.

## 2023-06-10 NOTE — Assessment & Plan Note (Signed)
S/p revascularization.  Continue risk factor modification.  Has previously seen [facility].   

## 2023-06-10 NOTE — Assessment & Plan Note (Addendum)
intolerant to statin medication. Continue low cholesterol diet and exercise.  Follow lipid panel.

## 2023-06-10 NOTE — Assessment & Plan Note (Signed)
Had significant muscle aches with statin medication.

## 2023-06-22 ENCOUNTER — Other Ambulatory Visit (INDEPENDENT_AMBULATORY_CARE_PROVIDER_SITE_OTHER): Payer: Medicare Other

## 2023-06-22 DIAGNOSIS — R739 Hyperglycemia, unspecified: Secondary | ICD-10-CM

## 2023-06-22 DIAGNOSIS — I10 Essential (primary) hypertension: Secondary | ICD-10-CM

## 2023-06-22 DIAGNOSIS — E7800 Pure hypercholesterolemia, unspecified: Secondary | ICD-10-CM | POA: Diagnosis not present

## 2023-06-22 LAB — HEPATIC FUNCTION PANEL
ALT: 9 U/L (ref 0–35)
AST: 13 U/L (ref 0–37)
Albumin: 4.2 g/dL (ref 3.5–5.2)
Alkaline Phosphatase: 64 U/L (ref 39–117)
Bilirubin, Direct: 0.1 mg/dL (ref 0.0–0.3)
Total Bilirubin: 0.6 mg/dL (ref 0.2–1.2)
Total Protein: 7 g/dL (ref 6.0–8.3)

## 2023-06-22 LAB — LIPID PANEL
Cholesterol: 207 mg/dL — ABNORMAL HIGH (ref 0–200)
HDL: 67.4 mg/dL (ref >39.00)
LDL Cholesterol: 118 mg/dL — ABNORMAL HIGH (ref 0–99)
NonHDL: 139.58
Total CHOL/HDL Ratio: 3
Triglycerides: 106 mg/dL (ref 0.0–149.0)
VLDL: 21.2 mg/dL (ref 0.0–40.0)

## 2023-06-22 LAB — BASIC METABOLIC PANEL
BUN: 17 mg/dL (ref 6–23)
CO2: 28 meq/L (ref 19–32)
Calcium: 9.5 mg/dL (ref 8.4–10.5)
Chloride: 103 meq/L (ref 96–112)
Creatinine, Ser: 0.73 mg/dL (ref 0.40–1.20)
GFR: 79.35 mL/min (ref >60.00)
Glucose, Bld: 101 mg/dL — ABNORMAL HIGH (ref 70–99)
Potassium: 4.4 meq/L (ref 3.5–5.1)
Sodium: 138 meq/L (ref 135–145)

## 2023-06-22 LAB — HEMOGLOBIN A1C: Hgb A1c MFr Bld: 6.4 % (ref 4.6–6.5)

## 2023-07-10 ENCOUNTER — Other Ambulatory Visit: Payer: Medicare Other

## 2023-08-21 DIAGNOSIS — L309 Dermatitis, unspecified: Secondary | ICD-10-CM | POA: Diagnosis not present

## 2023-08-21 DIAGNOSIS — L738 Other specified follicular disorders: Secondary | ICD-10-CM | POA: Diagnosis not present

## 2023-08-21 DIAGNOSIS — L2389 Allergic contact dermatitis due to other agents: Secondary | ICD-10-CM | POA: Diagnosis not present

## 2023-09-11 DIAGNOSIS — L2389 Allergic contact dermatitis due to other agents: Secondary | ICD-10-CM | POA: Diagnosis not present

## 2023-09-11 DIAGNOSIS — L738 Other specified follicular disorders: Secondary | ICD-10-CM | POA: Diagnosis not present

## 2023-09-27 ENCOUNTER — Other Ambulatory Visit: Payer: Self-pay

## 2023-09-27 DIAGNOSIS — R739 Hyperglycemia, unspecified: Secondary | ICD-10-CM

## 2023-09-27 DIAGNOSIS — I10 Essential (primary) hypertension: Secondary | ICD-10-CM

## 2023-09-27 DIAGNOSIS — E7800 Pure hypercholesterolemia, unspecified: Secondary | ICD-10-CM

## 2023-10-04 ENCOUNTER — Other Ambulatory Visit (INDEPENDENT_AMBULATORY_CARE_PROVIDER_SITE_OTHER): Payer: Medicare Other

## 2023-10-04 DIAGNOSIS — R739 Hyperglycemia, unspecified: Secondary | ICD-10-CM | POA: Diagnosis not present

## 2023-10-04 DIAGNOSIS — E7800 Pure hypercholesterolemia, unspecified: Secondary | ICD-10-CM

## 2023-10-04 DIAGNOSIS — I10 Essential (primary) hypertension: Secondary | ICD-10-CM

## 2023-10-04 LAB — LIPID PANEL
Cholesterol: 197 mg/dL (ref 0–200)
HDL: 64.3 mg/dL (ref >39.00)
LDL Cholesterol: 116 mg/dL — ABNORMAL HIGH (ref 0–99)
NonHDL: 132.25
Total CHOL/HDL Ratio: 3
Triglycerides: 83 mg/dL (ref 0.0–149.0)
VLDL: 16.6 mg/dL (ref 0.0–40.0)

## 2023-10-04 LAB — HEPATIC FUNCTION PANEL
ALT: 9 U/L (ref 0–35)
AST: 12 U/L (ref 0–37)
Albumin: 4.2 g/dL (ref 3.5–5.2)
Alkaline Phosphatase: 58 U/L (ref 39–117)
Bilirubin, Direct: 0.1 mg/dL (ref 0.0–0.3)
Total Bilirubin: 0.5 mg/dL (ref 0.2–1.2)
Total Protein: 7.2 g/dL (ref 6.0–8.3)

## 2023-10-04 LAB — BASIC METABOLIC PANEL WITH GFR
BUN: 21 mg/dL (ref 6–23)
CO2: 29 meq/L (ref 19–32)
Calcium: 9.7 mg/dL (ref 8.4–10.5)
Chloride: 100 meq/L (ref 96–112)
Creatinine, Ser: 0.82 mg/dL (ref 0.40–1.20)
GFR: 68.88 mL/min (ref >60.00)
Glucose, Bld: 103 mg/dL — ABNORMAL HIGH (ref 70–99)
Potassium: 4.3 meq/L (ref 3.5–5.1)
Sodium: 138 meq/L (ref 135–145)

## 2023-10-04 LAB — HEMOGLOBIN A1C: Hgb A1c MFr Bld: 5.9 % (ref 4.6–6.5)

## 2023-10-04 LAB — TSH: TSH: 1.41 u[IU]/mL (ref 0.35–5.50)

## 2023-10-08 ENCOUNTER — Ambulatory Visit (INDEPENDENT_AMBULATORY_CARE_PROVIDER_SITE_OTHER): Payer: Medicare Other | Admitting: Internal Medicine

## 2023-10-08 ENCOUNTER — Encounter: Payer: Self-pay | Admitting: Internal Medicine

## 2023-10-08 VITALS — BP 128/70 | HR 62 | Temp 98.0°F | Resp 16 | Ht 65.5 in | Wt 161.2 lb

## 2023-10-08 DIAGNOSIS — G458 Other transient cerebral ischemic attacks and related syndromes: Secondary | ICD-10-CM | POA: Diagnosis not present

## 2023-10-08 DIAGNOSIS — E7800 Pure hypercholesterolemia, unspecified: Secondary | ICD-10-CM

## 2023-10-08 DIAGNOSIS — G720 Drug-induced myopathy: Secondary | ICD-10-CM | POA: Diagnosis not present

## 2023-10-08 DIAGNOSIS — T466X5A Adverse effect of antihyperlipidemic and antiarteriosclerotic drugs, initial encounter: Secondary | ICD-10-CM

## 2023-10-08 DIAGNOSIS — R739 Hyperglycemia, unspecified: Secondary | ICD-10-CM

## 2023-10-08 DIAGNOSIS — I10 Essential (primary) hypertension: Secondary | ICD-10-CM | POA: Diagnosis not present

## 2023-10-08 DIAGNOSIS — I700 Atherosclerosis of aorta: Secondary | ICD-10-CM | POA: Diagnosis not present

## 2023-10-08 DIAGNOSIS — Z860100 Personal history of colon polyps, unspecified: Secondary | ICD-10-CM

## 2023-10-08 MED ORDER — LOSARTAN POTASSIUM 50 MG PO TABS: 100.0000 mg | ORAL_TABLET | Freq: Every day | ORAL | 1 refills | Status: DC

## 2023-10-08 MED ORDER — EZETIMIBE 10 MG PO TABS: ORAL_TABLET | ORAL | 3 refills | Status: AC

## 2023-10-08 NOTE — Assessment & Plan Note (Signed)
 Continues on losartan. Blood pressure doing well. Continue to follow pressures. Follow metabolic panel.

## 2023-10-08 NOTE — Assessment & Plan Note (Signed)
 The 10-year ASCVD risk score (Arnett DK, et al., 2019) is: 26%   Values used to calculate the score:     Age: 78 years     Sex: Female     Is Non-Hispanic African American: No     Diabetic: No     Tobacco smoker: No     Systolic Blood Pressure: 128 mmHg     Is BP treated: Yes     HDL Cholesterol: 64.3 mg/dL     Total Cholesterol: 197 mg/dL  Intolerant to statin medication.  Intolerant to repatha. Restart zetia. Not interested in trying praluent. Follow lipid panel.  Low cholesterol diet and exercise.

## 2023-10-08 NOTE — Assessment & Plan Note (Signed)
 S/p revascularization.  Continue risk factor modification.  Has previously seen [facility].  Discussed f/u. States was told - released. Continue risk factor modification.

## 2023-10-08 NOTE — Assessment & Plan Note (Signed)
Colonoscopy 04/2021.  Recommended f/u in 3 years.  

## 2023-10-08 NOTE — Assessment & Plan Note (Signed)
 intolerant to statin medication. Intolerant to repatha. Restart zetia.

## 2023-10-08 NOTE — Assessment & Plan Note (Signed)
 Low carb diet and exercise.  Follow met b and a1c.  Discussed recent A1c improved.  Lab Results  Component Value Date   HGBA1C 5.9 10/04/2023

## 2023-10-08 NOTE — Progress Notes (Signed)
 Subjective:    Patient ID: Whitney Carr, female    DOB: 05-26-1946, 78 y.o.   MRN: [id number]  Patient here for  Chief Complaint  Patient presents with   Medical Management of Chronic Issues    HPI Here for a scheduled follow up - follow up regarding hypercholesterolemia and hypertension. S/p TKR (right) and PT. She is doing well. Stays active. No chest pain or sob reported. Blood pressure doing well. She did not tolerate repatha. Unable to take statins. Request to go back on zetia. Some allergies. Controlling with saline nasal spray. Does not feel needs anything more.    Past Medical History:  Diagnosis Date   Degenerative joint disease    GERD (gastroesophageal reflux disease)    History of hiatal hernia    Hypercholesterolemia    Hypertension    Past Surgical History:  Procedure Laterality Date   APPENDECTOMY  06/27/1967   COLONOSCOPY WITH PROPOFOL N/A 04/28/2021   Procedure: COLONOSCOPY WITH PROPOFOL;  Surgeon: Jaynie Collins, DO;  Location[facility] ENDOSCOPY;  Service: Gastroenterology;  Laterality: N/A;   ESOPHAGOGASTRODUODENOSCOPY ENDOSCOPY     TONSILECTOMY/ADENOIDECTOMY WITH MYRINGOTOMY  06/26/1968   TOTAL KNEE ARTHROPLASTY Right 02/08/2023   Procedure: TOTAL KNEE ARTHROPLASTY;  Surgeon: Reinaldo Berber, MD;  Location: [facility] ORS;  Service: Orthopedics;  Laterality: Right;   TUBAL LIGATION  06/27/1967   VAGINAL HYSTERECTOMY  06/26/1985   secondary to bleeding   Family History  Problem Relation Age of Onset   Congestive Heart Failure Mother    Thyroid disease Mother    Heart failure Father    Diabetes Father    Heart disease Sister        CABG   Alzheimer's disease Sister    Diabetes Sister    Diabetes Sister    Stroke Brother    COPD Brother    COPD Brother    Colon cancer Brother    Hypertension Brother    Heart disease Brother    Breast cancer Daughter    Autoimmune disease Daughter    Social History   Socioeconomic History   Marital  status: Married    Spouse name: Not on file   Number of children: 3   Years of education: Not on file   Highest education level: Not on file  Occupational History   Not on file  Tobacco Use   Smoking status: Never   Smokeless tobacco: Never  Vaping Use   Vaping status: Never Used  Substance and Sexual Activity   Alcohol use: No    Alcohol/week: 0.0 standard drinks of alcohol   Drug use: No   Sexual activity: Yes  Other Topics Concern   Not on file  Social History Narrative   Married   Social Drivers of Health   Financial Resource Strain: Low Risk  (04/17/2023)   Overall Financial Resource Strain (CARDIA)    Difficulty of Paying Living Expenses: Not hard at all  Food Insecurity: No Food Insecurity (04/17/2023)   Hunger Vital Sign    Worried About Running Out of Food in the Last Year: Never true    Ran Out of Food in the Last Year: Never true  Transportation Needs: No Transportation Needs (04/17/2023)   PRAPARE - Administrator, Civil Service (Medical): No    Lack of Transportation (Non-Medical): No  Physical Activity: Sufficiently Active (04/17/2023)   Exercise Vital Sign    Days of Exercise per Week: 6 days    Minutes of Exercise per  Session: 40 min  Stress: No Stress Concern Present (04/17/2023)   [employer] of Occupational Health - Occupational Stress Questionnaire    Feeling of Stress : Not at all  Social Connections: Moderately Isolated (04/17/2023)   Social Connection and Isolation Panel [NHANES]    Frequency of Communication with Friends and Family: More than three times a week    Frequency of Social Gatherings with Friends and Family: Once a week    Attends Religious Services: Never    Database administrator or Organizations: No    Attends Banker Meetings: Never    Marital Status: Married     Review of Systems  Constitutional:  Negative for appetite change and unexpected weight change.  HENT:  Negative for congestion and  sinus pressure.   Respiratory:  Negative for cough, chest tightness and shortness of breath.   Cardiovascular:  Negative for chest pain, palpitations and leg swelling.  Gastrointestinal:  Negative for abdominal pain, diarrhea, nausea and vomiting.  Genitourinary:  Negative for difficulty urinating and dysuria.  Musculoskeletal:  Negative for joint swelling and myalgias.  Skin:  Negative for color change and rash.  Neurological:  Negative for dizziness and headaches.  Psychiatric/Behavioral:  Negative for agitation and dysphoric mood.        Objective:     BP 128/70   Pulse 62   Temp 98 F (36.7 C)   Resp 16   Ht 5' 5.5" (1.664 m)   Wt 161 lb 3.2 oz (73.1 kg)   LMP 06/03/1986   SpO2 98%   BMI 26.42 kg/m  Wt Readings from Last 3 Encounters:  10/08/23 161 lb 3.2 oz (73.1 kg)  06/08/23 154 lb 6.4 oz (70 kg)  04/17/23 150 lb (68 kg)    Physical Exam Vitals reviewed.  Constitutional:      General: She is not in acute distress.    Appearance: Normal appearance.  HENT:     Head: Normocephalic and atraumatic.     Right Ear: External ear normal.     Left Ear: External ear normal.     Mouth/Throat:     Pharynx: No oropharyngeal exudate or posterior oropharyngeal erythema.  Eyes:     General: No scleral icterus.       Right eye: No discharge.        Left eye: No discharge.     Conjunctiva/sclera: Conjunctivae normal.  Neck:     Thyroid: No thyromegaly.  Cardiovascular:     Rate and Rhythm: Normal rate and regular rhythm.     Comments: 1/6 systolic murmur.  Pulmonary:     Effort: No respiratory distress.     Breath sounds: Normal breath sounds. No wheezing.  Abdominal:     General: Bowel sounds are normal.     Palpations: Abdomen is soft.     Tenderness: There is no abdominal tenderness.  Musculoskeletal:        General: No swelling or tenderness.     Cervical back: Neck supple. No tenderness.  Lymphadenopathy:     Cervical: No cervical adenopathy.  Skin:     Findings: No erythema or rash.  Neurological:     Mental Status: She is alert.  Psychiatric:        Mood and Affect: Mood normal.        Behavior: Behavior normal.         Outpatient Encounter Medications as of 10/08/2023  Medication Sig   ezetimibe (ZETIA) 10 MG tablet Take one tablet  q Monday, Wednesday and Friday.   acetaminophen (TYLENOL) 500 MG tablet Take 2 tablets (1,000 mg total) by mouth every 8 (eight) hours.   Calcium 600-200 MG-UNIT per tablet Take 1 tablet by mouth 2 (two) times daily.   losartan (COZAAR) 50 MG tablet Take 2 tablets (100 mg total) by mouth daily.   [DISCONTINUED] losartan (COZAAR) 50 MG tablet Take 2 tablets (100 mg total) by mouth daily.   [DISCONTINUED] REPATHA SURECLICK 140 MG/ML SOAJ INJECT 140MG  SUBCUTANEOUSLY  EVERY 2 WEEKS (Patient not taking: No sig reported)   No facility-administered encounter medications on file as of 10/08/2023.     Lab Results  Component Value Date   WBC 16.8 (H) 02/09/2023   HGB 11.5 (L) 02/09/2023   HCT 34.1 (L) 02/09/2023   PLT 230 02/09/2023   GLUCOSE 103 (H) 10/04/2023   CHOL 197 10/04/2023   TRIG 83.0 10/04/2023   HDL 64.30 10/04/2023   LDLDIRECT 143.6 05/13/2012   LDLCALC 116 (H) 10/04/2023   ALT 9 10/04/2023   AST 12 10/04/2023   NA 138 10/04/2023   K 4.3 10/04/2023   CL 100 10/04/2023   CREATININE 0.82 10/04/2023   BUN 21 10/04/2023   CO2 29 10/04/2023   TSH 1.41 10/04/2023   HGBA1C 5.9 10/04/2023    MM 3D SCREENING MAMMOGRAM BILATERAL BREAST Result Date: 06/07/2023 CLINICAL DATA:  Screening. EXAM: DIGITAL SCREENING BILATERAL MAMMOGRAM WITH TOMOSYNTHESIS AND CAD TECHNIQUE: Bilateral screening digital craniocaudal and mediolateral oblique mammograms were obtained. Bilateral screening digital breast tomosynthesis was performed. The images were evaluated with computer-aided detection. COMPARISON:  Previous exam(s). ACR Breast Density Category b: There are scattered areas of fibroglandular density.  FINDINGS: There are no findings suspicious for malignancy. IMPRESSION: No mammographic evidence of malignancy. A result letter of this screening mammogram will be mailed directly to the patient. RECOMMENDATION: Screening mammogram in one year. (Code:SM-B-01Y) BI-RADS CATEGORY  1: Negative. Electronically Signed   By: Frederico Hamman M.D.   On: 06/07/2023 17:26       Assessment & Plan:  Aortic atherosclerosis (HCC) Assessment & Plan: intolerant to statin medication. Intolerant to repatha. Restart zetia.    Hypercholesteremia Assessment & Plan: The 10-year ASCVD risk score (Arnett DK, et al., 2019) is: 26%   Values used to calculate the score:     Age: 67 years     Sex: Female     Is Non-Hispanic African American: No     Diabetic: No     Tobacco smoker: No     Systolic Blood Pressure: 128 mmHg     Is BP treated: Yes     HDL Cholesterol: 64.3 mg/dL     Total Cholesterol: 197 mg/dL  Intolerant to statin medication.  Intolerant to repatha. Restart zetia. Not interested in trying praluent. Follow lipid panel.  Low cholesterol diet and exercise.    Orders: -     Lipid panel; Future -     Hepatic function panel; Future  Hyperglycemia Assessment & Plan: Low carb diet and exercise.  Follow met b and a1c.  Discussed recent A1c improved.  Lab Results  Component Value Date   HGBA1C 5.9 10/04/2023    Orders: -     Hemoglobin A1c; Future  Primary hypertension Assessment & Plan: Continues on losartan. Blood pressure doing well. Continue to follow pressures. Follow metabolic panel.   Orders: -     Basic metabolic panel with GFR; Future  History of colonic polyps Assessment & Plan: Colonoscopy 04/2021.  Recommended f/u in  3 years.    Subclavian steal syndrome Assessment & Plan: S/p revascularization.  Continue risk factor modification.  Has previously seen [facility].  Discussed f/u. States was told - released. Continue risk factor modification.    Statin myopathy Assessment &  Plan: Intolerant to statin medication. Restart zetia.    Other orders -     Losartan Potassium; Take 2 tablets (100 mg total) by mouth daily.  Dispense: 180 tablet; Refill: 1 -     Ezetimibe; Take one tablet q Monday, Wednesday and Friday.  Dispense: 39 tablet; Refill: 3     Dellar Fenton, MD

## 2023-10-08 NOTE — Assessment & Plan Note (Signed)
 Intolerant to statin medication. Restart zetia.

## 2023-10-16 DIAGNOSIS — M1712 Unilateral primary osteoarthritis, left knee: Secondary | ICD-10-CM | POA: Diagnosis not present

## 2024-02-07 ENCOUNTER — Other Ambulatory Visit (INDEPENDENT_AMBULATORY_CARE_PROVIDER_SITE_OTHER)

## 2024-02-07 DIAGNOSIS — R739 Hyperglycemia, unspecified: Secondary | ICD-10-CM

## 2024-02-07 DIAGNOSIS — E7800 Pure hypercholesterolemia, unspecified: Secondary | ICD-10-CM | POA: Diagnosis not present

## 2024-02-07 DIAGNOSIS — I10 Essential (primary) hypertension: Secondary | ICD-10-CM | POA: Diagnosis not present

## 2024-02-07 LAB — BASIC METABOLIC PANEL WITH GFR
BUN: 14 mg/dL (ref 6–23)
CO2: 25 meq/L (ref 19–32)
Calcium: 9.3 mg/dL (ref 8.4–10.5)
Chloride: 105 meq/L (ref 96–112)
Creatinine, Ser: 0.75 mg/dL (ref 0.40–1.20)
GFR: 76.47 mL/min (ref >60.00)
Glucose, Bld: 102 mg/dL — ABNORMAL HIGH (ref 70–99)
Potassium: 3.9 meq/L (ref 3.5–5.1)
Sodium: 139 meq/L (ref 135–145)

## 2024-02-07 LAB — HEMOGLOBIN A1C: Hgb A1c MFr Bld: 6 % (ref 4.6–6.5)

## 2024-02-07 LAB — HEPATIC FUNCTION PANEL
ALT: 8 U/L (ref 0–35)
AST: 14 U/L (ref 0–37)
Albumin: 4.2 g/dL (ref 3.5–5.2)
Alkaline Phosphatase: 58 U/L (ref 39–117)
Bilirubin, Direct: 0.1 mg/dL (ref 0.0–0.3)
Total Bilirubin: 0.6 mg/dL (ref 0.2–1.2)
Total Protein: 6.8 g/dL (ref 6.0–8.3)

## 2024-02-07 LAB — LIPID PANEL
Cholesterol: 190 mg/dL (ref 0–200)
HDL: 52.7 mg/dL (ref >39.00)
LDL Cholesterol: 120 mg/dL — ABNORMAL HIGH (ref 0–99)
NonHDL: 137.4
Total CHOL/HDL Ratio: 4
Triglycerides: 89 mg/dL (ref 0.0–149.0)
VLDL: 17.8 mg/dL (ref 0.0–40.0)

## 2024-02-08 ENCOUNTER — Ambulatory Visit: Payer: Self-pay | Admitting: Internal Medicine

## 2024-02-11 ENCOUNTER — Ambulatory Visit: Admitting: Internal Medicine

## 2024-02-11 ENCOUNTER — Encounter: Payer: Self-pay | Admitting: Internal Medicine

## 2024-02-11 VITALS — BP 128/70 | HR 90 | Resp 16 | Ht 65.5 in | Wt 161.0 lb

## 2024-02-11 DIAGNOSIS — I779 Disorder of arteries and arterioles, unspecified: Secondary | ICD-10-CM

## 2024-02-11 DIAGNOSIS — R739 Hyperglycemia, unspecified: Secondary | ICD-10-CM

## 2024-02-11 DIAGNOSIS — Z860100 Personal history of colon polyps, unspecified: Secondary | ICD-10-CM | POA: Diagnosis not present

## 2024-02-11 DIAGNOSIS — I10 Essential (primary) hypertension: Secondary | ICD-10-CM

## 2024-02-11 DIAGNOSIS — G458 Other transient cerebral ischemic attacks and related syndromes: Secondary | ICD-10-CM | POA: Diagnosis not present

## 2024-02-11 DIAGNOSIS — I700 Atherosclerosis of aorta: Secondary | ICD-10-CM | POA: Diagnosis not present

## 2024-02-11 DIAGNOSIS — R0981 Nasal congestion: Secondary | ICD-10-CM | POA: Diagnosis not present

## 2024-02-11 DIAGNOSIS — T466X5A Adverse effect of antihyperlipidemic and antiarteriosclerotic drugs, initial encounter: Secondary | ICD-10-CM

## 2024-02-11 DIAGNOSIS — F439 Reaction to severe stress, unspecified: Secondary | ICD-10-CM

## 2024-02-11 DIAGNOSIS — E7800 Pure hypercholesterolemia, unspecified: Secondary | ICD-10-CM

## 2024-02-11 DIAGNOSIS — G720 Drug-induced myopathy: Secondary | ICD-10-CM

## 2024-02-11 DIAGNOSIS — Z1211 Encounter for screening for malignant neoplasm of colon: Secondary | ICD-10-CM

## 2024-02-11 MED ORDER — LOSARTAN POTASSIUM 50 MG PO TABS: 100.0000 mg | ORAL_TABLET | Freq: Every day | ORAL | 1 refills | Status: DC

## 2024-02-11 NOTE — Assessment & Plan Note (Signed)
 Continues on losartan . Blood pressure doing well. Continue to follow pressures. Follow metabolic panel. No changes today.

## 2024-02-11 NOTE — Progress Notes (Signed)
 Subjective:    Patient ID: Geniece Akers Udall, female    DOB: 05/26/1946, 78 y.o.   MRN: [id number]  Patient here for  Chief Complaint  Patient presents with   Medical Management of Chronic Issues    HPI Here for a scheduled follow up - follow up regarding hypercholesterolemia and hypertension. S/p TKR (right) and PT. She is doing well. Stays active. Did not tolerate repatha . Also had problems with statins. Last visit, back on zetia . Due this year - follow up colonoscopy. Saw ortho 09/2023 - f/u left knee pain - left knee OA. S/p steroid injection. Increased stress.  Increased stress with her mother-in-law's issues and her son.  Discussed.  Overall she feels she is handling things relatively well.  Denies any chest pain or shortness of breath.  No abdominal pain or bowel change reported.  She is having some minimal allergy symptoms with drainage.  Discussed using Nasacort .  Also reports some increased stress with her husband's health issues.   Past Medical History:  Diagnosis Date   Degenerative joint disease    GERD (gastroesophageal reflux disease)    History of hiatal hernia    Hypercholesterolemia    Hypertension    Past Surgical History:  Procedure Laterality Date   APPENDECTOMY  06/27/1967   COLONOSCOPY WITH PROPOFOL  N/A 04/28/2021   Procedure: COLONOSCOPY WITH PROPOFOL ;  Surgeon: Onita Elspeth Sharper, DO;  Location: [facility] ENDOSCOPY;  Service: Gastroenterology;  Laterality: N/A;   ESOPHAGOGASTRODUODENOSCOPY ENDOSCOPY     TONSILECTOMY/ADENOIDECTOMY WITH MYRINGOTOMY  06/26/1968   TOTAL KNEE ARTHROPLASTY Right 02/08/2023   Procedure: TOTAL KNEE ARTHROPLASTY;  Surgeon: Lorelle Hussar, MD;  Location: [facility] ORS;  Service: Orthopedics;  Laterality: Right;   TUBAL LIGATION  06/27/1967   VAGINAL HYSTERECTOMY  06/26/1985   secondary to bleeding   Family History  Problem Relation Age of Onset   Congestive Heart Failure Mother    Thyroid  disease Mother    Heart failure Father     Diabetes Father    Heart disease Sister        CABG   Alzheimer's disease Sister    Diabetes Sister    Diabetes Sister    Stroke Brother    COPD Brother    COPD Brother    Colon cancer Brother    Hypertension Brother    Heart disease Brother    Breast cancer Daughter    Autoimmune disease Daughter    Social History   Socioeconomic History   Marital status: Married    Spouse name: Not on file   Number of children: 3   Years of education: Not on file   Highest education level: Not on file  Occupational History   Not on file  Tobacco Use   Smoking status: Never   Smokeless tobacco: Never  Vaping Use   Vaping status: Never Used  Substance and Sexual Activity   Alcohol use: No    Alcohol/week: 0.0 standard drinks of alcohol   Drug use: No   Sexual activity: Yes  Other Topics Concern   Not on file  Social History Narrative   Married   Social Drivers of Health   Financial Resource Strain: Low Risk  (04/17/2023)   Overall Financial Resource Strain (CARDIA)    Difficulty of Paying Living Expenses: Not hard at all  Food Insecurity: No Food Insecurity (04/17/2023)   Hunger Vital Sign    Worried About Running Out of Food in the Last Year: Never true    Ran Out of  Food in the Last Year: Never true  Transportation Needs: No Transportation Needs (04/17/2023)   PRAPARE - Administrator, Civil Service (Medical): No    Lack of Transportation (Non-Medical): No  Physical Activity: Sufficiently Active (04/17/2023)   Exercise Vital Sign    Days of Exercise per Week: 6 days    Minutes of Exercise per Session: 40 min  Stress: No Stress Concern Present (04/17/2023)   [employer] of Occupational Health - Occupational Stress Questionnaire    Feeling of Stress : Not at all  Social Connections: Moderately Isolated (04/17/2023)   Social Connection and Isolation Panel    Frequency of Communication with Friends and Family: More than three times a week    Frequency of  Social Gatherings with Friends and Family: Once a week    Attends Religious Services: Never    Database administrator or Organizations: No    Attends Banker Meetings: Never    Marital Status: Married     Review of Systems  Constitutional:  Negative for appetite change and unexpected weight change.  HENT:  Negative for sinus pressure.        Minimal congestion and post nasal drainage.   Respiratory:  Negative for cough, chest tightness and shortness of breath.   Cardiovascular:  Negative for chest pain and palpitations.       Some leg swelling - notices in pm. Better in am.   Gastrointestinal:  Negative for abdominal pain, diarrhea, nausea and vomiting.  Genitourinary:  Negative for difficulty urinating and dysuria.  Musculoskeletal:  Negative for joint swelling and myalgias.  Skin:  Negative for color change and rash.  Neurological:  Negative for dizziness and headaches.  Psychiatric/Behavioral:  Negative for agitation and dysphoric mood.        Objective:     BP 128/70   Pulse 90   Resp 16   Ht 5' 5.5 (1.664 m)   Wt 161 lb (73 kg)   LMP 06/03/1986   SpO2 98%   BMI 26.38 kg/m  Wt Readings from Last 3 Encounters:  02/11/24 161 lb (73 kg)  10/08/23 161 lb 3.2 oz (73.1 kg)  06/08/23 154 lb 6.4 oz (70 kg)    Physical Exam Vitals reviewed.  Constitutional:      General: She is not in acute distress.    Appearance: Normal appearance.  HENT:     Head: Normocephalic and atraumatic.     Right Ear: External ear normal.     Left Ear: External ear normal.     Mouth/Throat:     Pharynx: No oropharyngeal exudate or posterior oropharyngeal erythema.  Eyes:     General: No scleral icterus.       Right eye: No discharge.        Left eye: No discharge.     Conjunctiva/sclera: Conjunctivae normal.  Neck:     Thyroid : No thyromegaly.  Cardiovascular:     Rate and Rhythm: Normal rate and regular rhythm.  Pulmonary:     Effort: No respiratory distress.      Breath sounds: Normal breath sounds. No wheezing.  Abdominal:     General: Bowel sounds are normal.     Palpations: Abdomen is soft.     Tenderness: There is no abdominal tenderness.  Musculoskeletal:        General: No tenderness.     Cervical back: Neck supple. No tenderness.     Comments: No increased swelling today. Stable. DP pulses palpable  and equal bilaterally.   Lymphadenopathy:     Cervical: No cervical adenopathy.  Skin:    Findings: No erythema or rash.  Neurological:     Mental Status: She is alert.  Psychiatric:        Mood and Affect: Mood normal.        Behavior: Behavior normal.         Outpatient Encounter Medications as of 02/11/2024  Medication Sig   acetaminophen  (TYLENOL ) 500 MG tablet Take 2 tablets (1,000 mg total) by mouth every 8 (eight) hours.   Calcium  600-200 MG-UNIT per tablet Take 1 tablet by mouth 2 (two) times daily.   ezetimibe  (ZETIA ) 10 MG tablet Take one tablet q Monday, Wednesday and Friday.   losartan  (COZAAR ) 50 MG tablet Take 2 tablets (100 mg total) by mouth daily.   [DISCONTINUED] losartan  (COZAAR ) 50 MG tablet Take 2 tablets (100 mg total) by mouth daily.   No facility-administered encounter medications on file as of 02/11/2024.     Lab Results  Component Value Date   WBC 16.8 (H) 02/09/2023   HGB 11.5 (L) 02/09/2023   HCT 34.1 (L) 02/09/2023   PLT 230 02/09/2023   GLUCOSE 102 (H) 02/07/2024   CHOL 190 02/07/2024   TRIG 89.0 02/07/2024   HDL 52.70 02/07/2024   LDLDIRECT 143.6 05/13/2012   LDLCALC 120 (H) 02/07/2024   ALT 8 02/07/2024   AST 14 02/07/2024   NA 139 02/07/2024   K 3.9 02/07/2024   CL 105 02/07/2024   CREATININE 0.75 02/07/2024   BUN 14 02/07/2024   CO2 25 02/07/2024   TSH 1.41 10/04/2023   HGBA1C 6.0 02/07/2024    MM 3D SCREENING MAMMOGRAM BILATERAL BREAST Result Date: 06/07/2023 CLINICAL DATA:  Screening. EXAM: DIGITAL SCREENING BILATERAL MAMMOGRAM WITH TOMOSYNTHESIS AND CAD TECHNIQUE: Bilateral  screening digital craniocaudal and mediolateral oblique mammograms were obtained. Bilateral screening digital breast tomosynthesis was performed. The images were evaluated with computer-aided detection. COMPARISON:  Previous exam(s). ACR Breast Density Category b: There are scattered areas of fibroglandular density. FINDINGS: There are no findings suspicious for malignancy. IMPRESSION: No mammographic evidence of malignancy. A result letter of this screening mammogram will be mailed directly to the patient. RECOMMENDATION: Screening mammogram in one year. (Code:SM-B-01Y) BI-RADS CATEGORY  1: Negative. Electronically Signed   By: Rosaline Collet M.D.   On: 06/07/2023 17:26       Assessment & Plan:  Colon cancer screening -     Ambulatory referral to Gastroenterology  Primary hypertension Assessment & Plan: Continues on losartan . Blood pressure doing well. Continue to follow pressures. Follow metabolic panel. No changes today.   Orders: -     Basic metabolic panel with GFR; Future -     CBC with Differential/Platelet; Future  Hyperglycemia Assessment & Plan: Low carb diet and exercise.  Follow met b and a1c.  Discussed recent labs.  Lab Results  Component Value Date   HGBA1C 6.0 02/07/2024    Orders: -     Hemoglobin A1c; Future  Hypercholesteremia Assessment & Plan: Intolerant to statin medication and repatha . Continue low cholesterol diet and exercise. On zetia . Follow lipid panel.   Orders: -     Hepatic function panel; Future -     Lipid panel; Future  Subclavian steal syndrome Assessment & Plan: S/p revascularization.  Continue risk factor modification.  Has previously seen [facility].  Discussed f/u. States was told - released. Continue risk factor modification.    Statin myopathy Assessment & Plan: Intolerance  to statin medication and repatha . On zetia .    Nasal congestion Assessment & Plan: Minimal allergy symptoms with drainage. Discussed nasacort . Follow.     Aortic atherosclerosis (HCC) Assessment & Plan: Intolerant to statins and repatha . Continue zetia .    Carotid artery disease, unspecified laterality, unspecified type (HCC) Assessment & Plan: Previously worked up by vascular surgery.  Had screening - read as normal exam.  Described minimal thickening- CCA and bulb bilaterally.  Stated no further w/up warranted.  Continue zetia . Intolerant to repatha  and statin medication.    History of colonic polyps Assessment & Plan: Colonoscopy 04/2021.  Recommended f/u in 3 years. Due this year. Order placed for referral.    Stress Assessment & Plan: Discussed. Notify me if feels needs further intervention.    Other orders -     Losartan  Potassium; Take 2 tablets (100 mg total) by mouth daily.  Dispense: 180 tablet; Refill: 1     Allena Hamilton, MD

## 2024-02-16 ENCOUNTER — Encounter: Payer: Self-pay | Admitting: Internal Medicine

## 2024-02-16 DIAGNOSIS — F439 Reaction to severe stress, unspecified: Secondary | ICD-10-CM | POA: Insufficient documentation

## 2024-02-16 NOTE — Assessment & Plan Note (Signed)
 S/p revascularization.  Continue risk factor modification.  Has previously seen [facility].  Discussed f/u. States was told - released. Continue risk factor modification.

## 2024-02-16 NOTE — Assessment & Plan Note (Signed)
 Intolerant to statin medication and repatha . Continue low cholesterol diet and exercise. On zetia . Follow lipid panel.

## 2024-02-16 NOTE — Assessment & Plan Note (Signed)
 Minimal allergy symptoms with drainage. Discussed nasacort . Follow.

## 2024-02-16 NOTE — Assessment & Plan Note (Signed)
 Previously worked up by vascular surgery.  Had screening - read as normal exam.  Described minimal thickening- CCA and bulb bilaterally.  Stated no further w/up warranted.  Continue zetia . Intolerant to repatha  and statin medication.

## 2024-02-16 NOTE — Assessment & Plan Note (Signed)
Discussed.  Notify me if feels needs further intervention.  

## 2024-02-16 NOTE — Assessment & Plan Note (Signed)
 Intolerant to statins and repatha . Continue zetia .

## 2024-02-16 NOTE — Assessment & Plan Note (Signed)
 Low carb diet and exercise.  Follow met b and a1c.  Discussed recent labs.  Lab Results  Component Value Date   HGBA1C 6.0 02/07/2024

## 2024-02-16 NOTE — Assessment & Plan Note (Signed)
 Colonoscopy 04/2021.  Recommended f/u in 3 years. Due this year. Order placed for referral.

## 2024-02-16 NOTE — Assessment & Plan Note (Signed)
 Intolerance to statin medication and repatha . On zetia .

## 2024-02-22 ENCOUNTER — Encounter: Payer: Self-pay | Admitting: Pharmacist

## 2024-02-22 NOTE — Progress Notes (Signed)
 Pharmacy Quality Measure Review  This patient is appearing on a report for being at risk of failing the adherence measure for hypertension (ACEi/ARB) medications this calendar year.   Medication: losartan  50 mg  Last fill date: 10/08/23 for 90 day supply 1 refill remaining. Confirmed with pharmacy, has not been refilled since April.   Contacted pharmacy to facilitate refills.

## 2024-04-22 ENCOUNTER — Ambulatory Visit (INDEPENDENT_AMBULATORY_CARE_PROVIDER_SITE_OTHER): Payer: Medicare Other | Admitting: *Deleted

## 2024-04-22 VITALS — BP 130/68 | Ht 65.0 in | Wt 160.0 lb

## 2024-04-22 DIAGNOSIS — Z1231 Encounter for screening mammogram for malignant neoplasm of breast: Secondary | ICD-10-CM

## 2024-04-22 DIAGNOSIS — Z0000 Encounter for general adult medical examination without abnormal findings: Secondary | ICD-10-CM

## 2024-04-22 NOTE — Patient Instructions (Signed)
 Whitney Carr,  Thank you for taking the time for your Medicare Wellness Visit. I appreciate your continued commitment to your health goals. Please review the care plan we discussed, and feel free to reach out if I can assist you further.  Medicare recommends these wellness visits once per year to help you and your care team stay ahead of potential health issues. These visits are designed to focus on prevention, allowing your provider to concentrate on managing your acute and chronic conditions during your regular appointments.  Please note that Annual Wellness Visits do not include a physical exam. Some assessments may be limited, especially if the visit was conducted virtually. If needed, we may recommend a separate in-person follow-up with your provider.  Ongoing Care Seeing your primary care provider every 3 to 6 months helps us  monitor your health and provide consistent, personalized care.  You have an order for:  []   2D Mammogram  [x]   3D Mammogram  []   Bone Density     Please call for appointment:  [facility]  [address] [place] [address] [phone]   Make sure to wear two-piece clothing.  No lotions, powders, or deodorants the day of the appointment. Make sure to bring picture ID and insurance card.  Bring list of medications you are currently taking including any supplements.     Referrals If a referral was made during today's visit and you haven't received any updates within two weeks, please contact the referred provider directly to check on the status.  Recommended Screenings:  Health Maintenance  Topic Date Due   COVID-19 Vaccine (5 - 2025-26 season) 02/25/2024   Colon Cancer Screening  04/28/2024   Breast Cancer Screening  06/05/2024   Medicare Annual Wellness Visit  04/22/2025   DTaP/Tdap/Td vaccine (2 - Td or Tdap) 02/01/2027   Pneumococcal Vaccine for age over 98  Completed   Flu Shot  Completed    DEXA scan (bone density measurement)  Completed   Hepatitis C Screening  Completed   Zoster (Shingles) Vaccine  Completed   Meningitis B Vaccine  Aged Out       04/22/2024    8:28 AM  Advanced Directives  Does Patient Have a Medical Advance Directive? Yes  Type of Estate Agent of [place];Living will  Copy of Healthcare Power of Attorney in Chart? No - copy requested   Advance Care Planning is important because it: Ensures you receive medical care that aligns with your values, goals, and preferences. Provides guidance to your family and loved ones, reducing the emotional burden of decision-making during critical moments.  Vision: Annual vision screenings are recommended for early detection of glaucoma, cataracts, and diabetic retinopathy. These exams can also reveal signs of chronic conditions such as diabetes and high blood pressure.  Dental: Annual dental screenings help detect early signs of oral cancer, gum disease, and other conditions linked to overall health, including heart disease and diabetes.  Please see the attached documents for additional preventive care recommendations.

## 2024-04-22 NOTE — Progress Notes (Signed)
 Subjective:   Whitney Carr is a 78 y.o. who presents for a Medicare Wellness preventive visit.  As a reminder, Annual Wellness Visits don't include a physical exam, and some assessments may be limited, especially if this visit is performed virtually. We may recommend an in-person follow-up visit with your provider if needed.  Visit Complete: Virtual I connected with  Whitney Carr on 04/22/24 by a audio enabled telemedicine application and verified that I am speaking with the correct person using two identifiers.  Patient Location: Home  Provider Location: Home Office  I discussed the limitations of evaluation and management by telemedicine. The patient expressed understanding and agreed to proceed.  Vital Signs: Because this visit was a virtual/telehealth visit, some criteria may be missing or patient reported. Any vitals not documented were not able to be obtained and vitals that have been documented are patient reported.  VideoDeclined- This patient declined Librarian, academic. Therefore the visit was completed with audio only.  Persons Participating in Visit: Patient.  AWV Questionnaire: No: Patient Medicare AWV questionnaire was not completed prior to this visit.  Cardiac Risk Factors include: advanced age (>59men, >68 women);dyslipidemia;hypertension     Objective:    Today's Vitals   04/22/24 0815  BP: 130/68  Weight: 160 lb (72.6 kg)  Height: 5' 5 (1.651 m)   Body mass index is 26.63 kg/m.     04/22/2024    8:28 AM 04/17/2023    3:19 PM 02/08/2023    9:09 AM 01/30/2023   10:26 AM 04/13/2022    2:58 PM 04/28/2021    7:54 AM 04/12/2021    9:14 AM  Advanced Directives  Does Patient Have a Medical Advance Directive? Yes Yes Yes Yes Yes Yes Yes  Type of Estate Agent of [place];Living will Healthcare Power of [place];Living will Healthcare Power of [place];Living will  Healthcare Power of  [place];Living will  Healthcare Power of [place];Living will  Does patient want to make changes to medical advance directive?   No - Patient declined No - Patient declined No - Patient declined  No - Patient declined  Copy of Healthcare Power of Attorney in Chart? No - copy requested No - copy requested No - copy requested  No - copy requested  No - copy requested    Current Medications (verified) Outpatient Encounter Medications as of 04/22/2024  Medication Sig   acetaminophen  (TYLENOL ) 500 MG tablet Take 2 tablets (1,000 mg total) by mouth every 8 (eight) hours.   Calcium  600-200 MG-UNIT per tablet Take 1 tablet by mouth 2 (two) times daily.   ezetimibe  (ZETIA ) 10 MG tablet Take one tablet q Monday, Wednesday and Friday.   hydrOXYzine (ATARAX) 25 MG tablet Take 25 mg by mouth at bedtime as needed.   losartan  (COZAAR ) 50 MG tablet Take 2 tablets (100 mg total) by mouth daily.   No facility-administered encounter medications on file as of 04/22/2024.    Allergies (verified) Patient has no known allergies.   History: Past Medical History:  Diagnosis Date   Degenerative joint disease    GERD (gastroesophageal reflux disease)    History of hiatal hernia    Hypercholesterolemia    Hypertension    Past Surgical History:  Procedure Laterality Date   APPENDECTOMY  06/27/1967   COLONOSCOPY WITH PROPOFOL  N/A 04/28/2021   Procedure: COLONOSCOPY WITH PROPOFOL ;  Surgeon: Onita Elspeth Sharper, DO;  Location: [facility] ENDOSCOPY;  Service: Gastroenterology;  Laterality: N/A;   ESOPHAGOGASTRODUODENOSCOPY ENDOSCOPY  TONSILECTOMY/ADENOIDECTOMY WITH MYRINGOTOMY  06/26/1968   TOTAL KNEE ARTHROPLASTY Right 02/08/2023   Procedure: TOTAL KNEE ARTHROPLASTY;  Surgeon: Lorelle Hussar, MD;  Location: [facility] ORS;  Service: Orthopedics;  Laterality: Right;   TUBAL LIGATION  06/27/1967   VAGINAL HYSTERECTOMY  06/26/1985   secondary to bleeding   Family History  Problem Relation Age of Onset    Congestive Heart Failure Mother    Thyroid  disease Mother    Heart failure Father    Diabetes Father    Heart disease Sister        CABG   Alzheimer's disease Sister    Diabetes Sister    Diabetes Sister    Stroke Brother    COPD Brother    COPD Brother    Colon cancer Brother    Hypertension Brother    Heart disease Brother    Breast cancer Daughter    Autoimmune disease Daughter    Social History   Socioeconomic History   Marital status: Married    Spouse name: Not on file   Number of children: 3   Years of education: Not on file   Highest education level: Not on file  Occupational History   Not on file  Tobacco Use   Smoking status: Never   Smokeless tobacco: Never  Vaping Use   Vaping status: Never Used  Substance and Sexual Activity   Alcohol use: No    Alcohol/week: 0.0 standard drinks of alcohol   Drug use: No   Sexual activity: Yes  Other Topics Concern   Not on file  Social History Narrative   Married   Social Drivers of Health   Financial Resource Strain: Low Risk  (04/22/2024)   Overall Financial Resource Strain (CARDIA)    Difficulty of Paying Living Expenses: Not hard at all  Food Insecurity: No Food Insecurity (04/22/2024)   Hunger Vital Sign    Worried About Running Out of Food in the Last Year: Never true    Ran Out of Food in the Last Year: Never true  Transportation Needs: No Transportation Needs (04/22/2024)   PRAPARE - Administrator, Civil Service (Medical): No    Lack of Transportation (Non-Medical): No  Physical Activity: Insufficiently Active (04/22/2024)   Exercise Vital Sign    Days of Exercise per Week: 4 days    Minutes of Exercise per Session: 20 min  Stress: No Stress Concern Present (04/22/2024)   [employer] of Occupational Health - Occupational Stress Questionnaire    Feeling of Stress: Only a little  Social Connections: Moderately Integrated (04/22/2024)   Social Connection and Isolation Panel     Frequency of Communication with Friends and Family: More than three times a week    Frequency of Social Gatherings with Friends and Family: More than three times a week    Attends Religious Services: Never    Database Administrator or Organizations: Yes    Attends Engineer, Structural: More than 4 times per year    Marital Status: Married    Tobacco Counseling Counseling given: Not Answered    Clinical Intake:  Pre-visit preparation completed: Yes  Pain : No/denies pain     BMI - recorded: 26.63 Nutritional Status: BMI 25 -29 Overweight Nutritional Risks: None Diabetes: No  Lab Results  Component Value Date   HGBA1C 6.0 02/07/2024   HGBA1C 5.9 10/04/2023   HGBA1C 6.4 06/22/2023     How often do you need to have someone help you when  you read instructions, pamphlets, or other written materials from your doctor or pharmacy?: 1 - Never  Interpreter Needed?: No  Information entered by :: R. Jb Dulworth LPN   Activities of Daily Living     04/22/2024    8:17 AM  In your present state of health, do you have any difficulty performing the following activities:  Hearing? 0  Vision? 0  Difficulty concentrating or making decisions? 0  Walking or climbing stairs? 0  Dressing or bathing? 0  Doing errands, shopping? 0  Preparing Food and eating ? N  Using the Toilet? N  In the past six months, have you accidently leaked urine? N  Do you have problems with loss of bowel control? N  Managing your Medications? N  Managing your Finances? N  Housekeeping or managing your Housekeeping? N    Patient Care Team: Glendia Shad, MD as PCP - General (Internal Medicine) Lorelle Hussar, MD as Consulting Physician (Orthopedic Surgery)  I have updated your Care Teams any recent Medical Services you may have received from other providers in the past year.     Assessment:   This is a routine wellness examination for [facility].  Hearing/Vision screen Hearing Screening -  Comments:: No issues Vision Screening - Comments:: readers   Goals Addressed             This Visit's Progress    Patient Stated       Wants to get out more        Depression Screen     04/22/2024    8:23 AM 04/17/2023    3:13 PM 06/06/2022    9:38 AM 04/25/2022    7:15 AM 04/13/2022    2:52 PM 12/23/2021    2:59 PM 04/12/2021    9:23 AM  PHQ 2/9 Scores  PHQ - 2 Score 0 0 0 0 0 0 0  PHQ- 9 Score 0 0         Fall Risk     04/22/2024    8:19 AM 04/17/2023    3:09 PM 06/06/2022    9:38 AM 04/25/2022    7:15 AM 04/13/2022    2:56 PM  Fall Risk   Falls in the past year? 0 0 0 0 0  Number falls in past yr: 0 0 0 0 0  Injury with Fall? 0 0 0 0 0  Risk for fall due to : No Fall Risks No Fall Risks No Fall Risks No Fall Risks No Fall Risks  Follow up Falls evaluation completed;Falls prevention discussed Falls prevention discussed;Falls evaluation completed Falls evaluation completed  Falls evaluation completed  Falls evaluation completed      Data saved with a previous flowsheet row definition    MEDICARE RISK AT HOME:  Medicare Risk at Home Any stairs in or around the home?: Yes If so, are there any without handrails?: No Home free of loose throw rugs in walkways, pet beds, electrical cords, etc?: Yes Adequate lighting in your home to reduce risk of falls?: Yes Life alert?: No Use of a cane, walker or w/c?: No Grab bars in the bathroom?: Yes Shower chair or bench in shower?: No Elevated toilet seat or a handicapped toilet?: No  TIMED UP AND GO:  Was the test performed?  No  Cognitive Function: 6CIT completed    04/03/2018    2:15 PM 04/02/2017    2:28 PM  MMSE - Mini Mental State Exam  Orientation to time 5 5   Orientation to  Place 5 5   Registration 3 3   Attention/ Calculation 5 5   Recall 3 3   Language- name 2 objects 2 2   Language- repeat 1 1  Language- follow 3 step command 3 3   Language- read & follow direction 1 1   Write a sentence 1 1    Copy design 1 1   Total score 30 30      Data saved with a previous flowsheet row definition        04/22/2024    8:29 AM 04/17/2023    3:20 PM 04/13/2022    2:56 PM 04/12/2021    9:20 AM 04/09/2020    9:31 AM  6CIT Screen  What Year? 0 points 0 points 0 points 0 points 0 points  What month? 0 points 0 points 0 points 0 points 0 points  What time? 0 points 0 points 0 points 0 points 0 points  Count back from 20 0 points 0 points 0 points 0 points   Months in reverse 0 points 0 points 0 points 0 points   Repeat phrase 0 points 0 points 0 points 0 points   Total Score 0 points 0 points 0 points 0 points     Immunizations Immunization History  Administered Date(s) Administered   Fluad Quad(high Dose 65+) 04/10/2019, 03/15/2021, 04/25/2022   Fluad Trivalent(High Dose 65+) 04/26/2023   INFLUENZA, HIGH DOSE SEASONAL PF 03/23/2015, 03/01/2016, 04/02/2017, 03/01/2018   Influenza Split 05/06/2012   Influenza,inj,Quad PF,6+ Mos 03/09/2014   Influenza-Unspecified 02/29/2016   Moderna Sars-Covid-2 Vaccination 07/28/2019, 08/26/2019, 04/24/2020, 05/13/2020   Pneumococcal Conjugate-13 01/20/2014   Pneumococcal Polysaccharide-23 05/08/2012   Tdap 01/31/2017   Zoster Recombinant(Shingrix) 01/31/2017, 05/15/2017    Screening Tests Health Maintenance  Topic Date Due   COVID-19 Vaccine (5 - 2025-26 season) 02/25/2024   Medicare Annual Wellness (AWV)  04/16/2024   Colonoscopy  04/28/2024   Mammogram  06/05/2024   Influenza Vaccine  09/23/2024 (Originally 01/25/2024)   DTaP/Tdap/Td (2 - Td or Tdap) 02/01/2027   Pneumococcal Vaccine: 50+ Years  Completed   DEXA SCAN  Completed   Hepatitis C Screening  Completed   Zoster Vaccines- Shingrix  Completed   Meningococcal B Vaccine  Aged Out    Health Maintenance Items Addressed: Mammogram ordered .Patient declines covid vaccine. Patient has a colonoscopy consult scheduled 08/21/24. Patient stated that she gets her Dexa scan at [facility] and will call and get that scheduled.   Additional Screening:  Vision Screening: Recommended annual ophthalmology exams for early detection of glaucoma and other disorders of the eye. Is the patient up to date with their annual eye exam?  Yes  Who is the provider or what is the name of the office in which the patient attends annual eye exams?  [facility]  Dental Screening: Recommended annual dental exams for proper oral hygiene  Community Resource Referral / Chronic Care Management: CRR required this visit?  No   CCM required this visit?  No   Plan:    I have personally reviewed and noted the following in the patient's chart:   Medical and social history Use of alcohol, tobacco or illicit drugs  Current medications and supplements including opioid prescriptions. Patient is not currently taking opioid prescriptions. Functional ability and status Nutritional status Physical activity Advanced directives List of other physicians Hospitalizations, surgeries, and ER visits in previous 12 months Vitals Screenings to include cognitive, depression, and falls Referrals and appointments  In addition,  I have reviewed and discussed with patient certain preventive protocols, quality metrics, and best practice recommendations. A written personalized care plan for preventive services as well as general preventive health recommendations were provided to patient.   Angeline Fredericks, LPN   [id number]   After Visit Summary: (MyChart) Due to this being a telephonic visit, the after visit summary with patients personalized plan was offered to patient via MyChart   Notes: Nothing significant to report at this time.

## 2024-05-27 ENCOUNTER — Encounter: Payer: Self-pay | Admitting: Pharmacist

## 2024-05-27 NOTE — Progress Notes (Signed)
 Pharmacy Quality Measure Review  This patient is appearing on a report for being at risk of failing the adherence measure for hypertension (ACEi/ARB) medications this calendar year.   Medication: losartan  50 mg Last fill date: 02/22/24 for 90 day supply  Contacted pharmacy to facilitate refills. New rx sent in Aug. Has refills remaining.    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

## 2024-06-06 ENCOUNTER — Ambulatory Visit
Admission: RE | Admit: 2024-06-06 | Discharge: 2024-06-06 | Disposition: A | Source: Ambulatory Visit | Attending: Internal Medicine | Admitting: Internal Medicine

## 2024-06-06 DIAGNOSIS — Z1231 Encounter for screening mammogram for malignant neoplasm of breast: Secondary | ICD-10-CM

## 2024-06-12 ENCOUNTER — Other Ambulatory Visit

## 2024-06-12 DIAGNOSIS — I10 Essential (primary) hypertension: Secondary | ICD-10-CM

## 2024-06-12 DIAGNOSIS — R739 Hyperglycemia, unspecified: Secondary | ICD-10-CM | POA: Diagnosis not present

## 2024-06-12 DIAGNOSIS — E7800 Pure hypercholesterolemia, unspecified: Secondary | ICD-10-CM

## 2024-06-12 LAB — CBC WITH DIFFERENTIAL/PLATELET
Basophils Absolute: 0 K/uL (ref 0.0–0.1)
Basophils Relative: 0.4 % (ref 0.0–3.0)
Eosinophils Absolute: 0.1 K/uL (ref 0.0–0.7)
Eosinophils Relative: 1 % (ref 0.0–5.0)
HCT: 41.7 % (ref 36.0–46.0)
Hemoglobin: 13.8 g/dL (ref 12.0–15.0)
Lymphocytes Relative: 29.1 % (ref 12.0–46.0)
Lymphs Abs: 1.7 K/uL (ref 0.7–4.0)
MCHC: 33 g/dL (ref 30.0–36.0)
MCV: 90.1 fl (ref 78.0–100.0)
Monocytes Absolute: 0.4 K/uL (ref 0.1–1.0)
Monocytes Relative: 7.6 % (ref 3.0–12.0)
Neutro Abs: 3.6 K/uL (ref 1.4–7.7)
Neutrophils Relative %: 61.9 % (ref 43.0–77.0)
Platelets: 256 K/uL (ref 150.0–400.0)
RBC: 4.63 Mil/uL (ref 3.87–5.11)
RDW: 13.8 % (ref 11.5–15.5)
WBC: 5.8 K/uL (ref 4.0–10.5)

## 2024-06-12 LAB — BASIC METABOLIC PANEL WITH GFR
BUN: 28 mg/dL — ABNORMAL HIGH (ref 6–23)
CO2: 25 meq/L (ref 19–32)
Calcium: 9.5 mg/dL (ref 8.4–10.5)
Chloride: 103 meq/L (ref 96–112)
Creatinine, Ser: 0.86 mg/dL (ref 0.40–1.20)
GFR: 64.73 mL/min (ref >60.00)
Glucose, Bld: 100 mg/dL — ABNORMAL HIGH (ref 70–99)
Potassium: 4.4 meq/L (ref 3.5–5.1)
Sodium: 136 meq/L (ref 135–145)

## 2024-06-12 LAB — LIPID PANEL
Cholesterol: 210 mg/dL — ABNORMAL HIGH (ref 28–200)
HDL: 54.4 mg/dL (ref >39.00)
LDL Cholesterol: 137 mg/dL — ABNORMAL HIGH (ref 10–99)
NonHDL: 155.57
Total CHOL/HDL Ratio: 4
Triglycerides: 92 mg/dL (ref 10.0–149.0)
VLDL: 18.4 mg/dL (ref 0.0–40.0)

## 2024-06-12 LAB — HEPATIC FUNCTION PANEL
ALT: 9 U/L (ref 3–35)
AST: 14 U/L (ref 5–37)
Albumin: 4.4 g/dL (ref 3.5–5.2)
Alkaline Phosphatase: 53 U/L (ref 39–117)
Bilirubin, Direct: 0.1 mg/dL (ref 0.1–0.3)
Total Bilirubin: 0.5 mg/dL (ref 0.2–1.2)
Total Protein: 7 g/dL (ref 6.0–8.3)

## 2024-06-12 LAB — HEMOGLOBIN A1C: Hgb A1c MFr Bld: 6 % (ref 4.6–6.5)

## 2024-06-13 ENCOUNTER — Ambulatory Visit: Payer: Self-pay | Admitting: Internal Medicine

## 2024-06-16 ENCOUNTER — Ambulatory Visit: Admitting: Internal Medicine

## 2024-06-16 ENCOUNTER — Encounter: Payer: Self-pay | Admitting: Internal Medicine

## 2024-06-16 VITALS — BP 138/82 | HR 76 | Temp 97.9°F | Ht 65.0 in | Wt 162.0 lb

## 2024-06-16 DIAGNOSIS — I10 Essential (primary) hypertension: Secondary | ICD-10-CM

## 2024-06-16 DIAGNOSIS — G720 Drug-induced myopathy: Secondary | ICD-10-CM

## 2024-06-16 DIAGNOSIS — Z0000 Encounter for general adult medical examination without abnormal findings: Secondary | ICD-10-CM | POA: Diagnosis not present

## 2024-06-16 DIAGNOSIS — G458 Other transient cerebral ischemic attacks and related syndromes: Secondary | ICD-10-CM | POA: Diagnosis not present

## 2024-06-16 DIAGNOSIS — R0981 Nasal congestion: Secondary | ICD-10-CM

## 2024-06-16 DIAGNOSIS — F439 Reaction to severe stress, unspecified: Secondary | ICD-10-CM | POA: Diagnosis not present

## 2024-06-16 DIAGNOSIS — Z96651 Presence of right artificial knee joint: Secondary | ICD-10-CM

## 2024-06-16 DIAGNOSIS — I779 Disorder of arteries and arterioles, unspecified: Secondary | ICD-10-CM

## 2024-06-16 DIAGNOSIS — Z860100 Personal history of colon polyps, unspecified: Secondary | ICD-10-CM

## 2024-06-16 DIAGNOSIS — E7800 Pure hypercholesterolemia, unspecified: Secondary | ICD-10-CM | POA: Diagnosis not present

## 2024-06-16 DIAGNOSIS — R739 Hyperglycemia, unspecified: Secondary | ICD-10-CM

## 2024-06-16 DIAGNOSIS — T466X5A Adverse effect of antihyperlipidemic and antiarteriosclerotic drugs, initial encounter: Secondary | ICD-10-CM

## 2024-06-16 MED ORDER — LOSARTAN POTASSIUM 50 MG PO TABS: 100.0000 mg | ORAL_TABLET | Freq: Every day | ORAL | 1 refills | Status: AC

## 2024-06-16 NOTE — Assessment & Plan Note (Addendum)
 Physical today 06/16/24.  Mammogram 06/12/24 - Birads I.  Colonoscopy 04/2021.  Appointment scheduled with GI 07/2024 - for f/u colonoscopy.

## 2024-06-16 NOTE — Progress Notes (Signed)
 "  Subjective:    Patient ID: Whitney Carr, female    DOB: 1945/07/31, 78 y.o.   MRN: [id number]  Patient here for  Chief Complaint  Patient presents with   Medical Management of Chronic Issues   Annual Exam    HPI Here for a physical exam.  S/p TKR (right) and PT. She is doing well. Stays active. Did not tolerate repatha . Also had problems with statins. Back on zetia . Increased stress. Doing better regarding stress. She feels she is handling things relatively well. No chest pain or sob reported. No abdominal pain or bowel change reported. Scheduled to see GI for colonoscopy 07/2024. Some nasal congestion. Took sudafed last night. Blood pressure a little elevated this am. States has been averaging 128-130 systolic readings at home. Discussed labs. Discussed cholesterol treatment. Discussed adding bempadoic acid. Also discussed scheduling bone density.    Past Medical History:  Diagnosis Date   Degenerative joint disease    GERD (gastroesophageal reflux disease)    History of hiatal hernia    Hypercholesterolemia    Hypertension    Past Surgical History:  Procedure Laterality Date   APPENDECTOMY  06/27/1967   COLONOSCOPY WITH PROPOFOL  N/A 04/28/2021   Procedure: COLONOSCOPY WITH PROPOFOL ;  Surgeon: Onita Elspeth Sharper, DO;  Location: [facility] ENDOSCOPY;  Service: Gastroenterology;  Laterality: N/A;   ESOPHAGOGASTRODUODENOSCOPY ENDOSCOPY     TONSILECTOMY/ADENOIDECTOMY WITH MYRINGOTOMY  06/26/1968   TOTAL KNEE ARTHROPLASTY Right 02/08/2023   Procedure: TOTAL KNEE ARTHROPLASTY;  Surgeon: Lorelle Hussar, MD;  Location: [facility] ORS;  Service: Orthopedics;  Laterality: Right;   TUBAL LIGATION  06/27/1967   VAGINAL HYSTERECTOMY  06/26/1985   secondary to bleeding   Family History  Problem Relation Age of Onset   Congestive Heart Failure Mother    Thyroid  disease Mother    Heart failure Father    Diabetes Father    Heart disease Sister        CABG   Alzheimer's disease Sister     Diabetes Sister    Diabetes Sister    Stroke Brother    COPD Brother    COPD Brother    Colon cancer Brother    Hypertension Brother    Heart disease Brother    Breast cancer Daughter    Autoimmune disease Daughter    Social History   Socioeconomic History   Marital status: Married    Spouse name: Not on file   Number of children: 3   Years of education: Not on file   Highest education level: Not on file  Occupational History   Not on file  Tobacco Use   Smoking status: Never   Smokeless tobacco: Never  Vaping Use   Vaping status: Never Used  Substance and Sexual Activity   Alcohol use: No    Alcohol/week: 0.0 standard drinks of alcohol   Drug use: No   Sexual activity: Yes  Other Topics Concern   Not on file  Social History Narrative   Married   Social Drivers of Health   Tobacco Use: Low Risk (06/22/2024)   Patient History    Smoking Tobacco Use: Never    Smokeless Tobacco Use: Never    Passive Exposure: Not on file  Financial Resource Strain: Low Risk (04/22/2024)   Overall Financial Resource Strain (CARDIA)    Difficulty of Paying Living Expenses: Not hard at all  Food Insecurity: No Food Insecurity (04/22/2024)   Epic    Worried About Running Out of Food in the Last  Year: Never true    Ran Out of Food in the Last Year: Never true  Transportation Needs: No Transportation Needs (04/22/2024)   Epic    Lack of Transportation (Medical): No    Lack of Transportation (Non-Medical): No  Physical Activity: Insufficiently Active (04/22/2024)   Exercise Vital Sign    Days of Exercise per Week: 4 days    Minutes of Exercise per Session: 20 min  Stress: No Stress Concern Present (04/22/2024)   [employer] of Occupational Health - Occupational Stress Questionnaire    Feeling of Stress: Only a little  Social Connections: Moderately Integrated (04/22/2024)   Social Connection and Isolation Panel    Frequency of Communication with Friends and Family: More than  three times a week    Frequency of Social Gatherings with Friends and Family: More than three times a week    Attends Religious Services: Never    Database Administrator or Organizations: Yes    Attends Banker Meetings: More than 4 times per year    Marital Status: Married  Depression (PHQ2-9): Low Risk (06/16/2024)   Depression (PHQ2-9)    PHQ-2 Score: 0  Alcohol Screen: Low Risk (04/22/2024)   Alcohol Screen    Last Alcohol Screening Score (AUDIT): 0  Housing: Unknown (04/22/2024)   Epic    Unable to Pay for Housing in the Last Year: No    Number of Times Moved in the Last Year: Not on file    Homeless in the Last Year: No  Utilities: Not At Risk (04/22/2024)   Epic    Threatened with loss of utilities: No  Health Literacy: Adequate Health Literacy (04/22/2024)   B1300 Health Literacy    Frequency of need for help with medical instructions: Never     Review of Systems  Constitutional:  Negative for appetite change and unexpected weight change.  HENT:  Positive for congestion. Negative for sinus pressure and sore throat.   Eyes:  Negative for pain and visual disturbance.  Respiratory:  Negative for cough, chest tightness and shortness of breath.   Cardiovascular:  Negative for chest pain, palpitations and leg swelling.  Gastrointestinal:  Negative for abdominal pain, diarrhea, nausea and vomiting.  Genitourinary:  Negative for difficulty urinating and dysuria.  Musculoskeletal:  Negative for joint swelling and myalgias.  Skin:  Negative for color change and rash.  Neurological:  Negative for dizziness and headaches.  Hematological:  Negative for adenopathy. Does not bruise/bleed easily.  Psychiatric/Behavioral:  Negative for agitation and dysphoric mood.        Objective:     BP 138/82   Pulse 76   Temp 97.9 F (36.6 C) (Oral)   Ht 5' 5 (1.651 m)   Wt 162 lb (73.5 kg)   LMP 06/03/1986   SpO2 98%   BMI 26.96 kg/m  Wt Readings from Last 3  Encounters:  06/16/24 162 lb (73.5 kg)  04/22/24 160 lb (72.6 kg)  02/11/24 161 lb (73 kg)    Physical Exam Vitals reviewed.  Constitutional:      General: She is not in acute distress.    Appearance: Normal appearance. She is well-developed.  HENT:     Head: Normocephalic and atraumatic.     Right Ear: External ear normal.     Left Ear: External ear normal.  Eyes:     General: No scleral icterus.       Right eye: No discharge.        Left eye:  No discharge.     Conjunctiva/sclera: Conjunctivae normal.  Neck:     Thyroid : No thyromegaly.  Cardiovascular:     Rate and Rhythm: Normal rate and regular rhythm.  Pulmonary:     Effort: No tachypnea, accessory muscle usage or respiratory distress.     Breath sounds: Normal breath sounds. No decreased breath sounds or wheezing.  Chest:  Breasts:    Right: No inverted nipple, mass, nipple discharge or tenderness (no axillary adenopathy).     Left: No inverted nipple, mass, nipple discharge or tenderness (no axilarry adenopathy).  Abdominal:     General: Bowel sounds are normal.     Palpations: Abdomen is soft.     Tenderness: There is no abdominal tenderness.  Musculoskeletal:        General: No swelling or tenderness.     Cervical back: Neck supple.  Lymphadenopathy:     Cervical: No cervical adenopathy.  Skin:    Findings: No erythema or rash.  Neurological:     Mental Status: She is alert and oriented to person, place, and time.  Psychiatric:        Mood and Affect: Mood normal.        Behavior: Behavior normal.         Outpatient Encounter Medications as of 06/16/2024  Medication Sig   acetaminophen  (TYLENOL ) 500 MG tablet Take 2 tablets (1,000 mg total) by mouth every 8 (eight) hours.   Calcium  600-200 MG-UNIT per tablet Take 1 tablet by mouth 2 (two) times daily.   ezetimibe  (ZETIA ) 10 MG tablet Take one tablet q Monday, Wednesday and Friday.   hydrOXYzine (ATARAX) 25 MG tablet Take 25 mg by mouth at bedtime as  needed.   losartan  (COZAAR ) 50 MG tablet Take 2 tablets (100 mg total) by mouth daily.   [DISCONTINUED] losartan  (COZAAR ) 50 MG tablet Take 2 tablets (100 mg total) by mouth daily.   No facility-administered encounter medications on file as of 06/16/2024.     Lab Results  Component Value Date   WBC 5.8 06/12/2024   HGB 13.8 06/12/2024   HCT 41.7 06/12/2024   PLT 256.0 06/12/2024   GLUCOSE 100 (H) 06/12/2024   CHOL 210 (H) 06/12/2024   TRIG 92.0 06/12/2024   HDL 54.40 06/12/2024   LDLDIRECT 143.6 05/13/2012   LDLCALC 137 (H) 06/12/2024   ALT 9 06/12/2024   AST 14 06/12/2024   NA 136 06/12/2024   K 4.4 06/12/2024   CL 103 06/12/2024   CREATININE 0.86 06/12/2024   BUN 28 (H) 06/12/2024   CO2 25 06/12/2024   TSH 1.41 10/04/2023   HGBA1C 6.0 06/12/2024    MM 3D SCREENING MAMMOGRAM BILATERAL BREAST Result Date: 06/12/2024 CLINICAL DATA:  Screening. EXAM: DIGITAL SCREENING BILATERAL MAMMOGRAM WITH TOMOSYNTHESIS AND CAD TECHNIQUE: Bilateral screening digital craniocaudal and mediolateral oblique mammograms were obtained. Bilateral screening digital breast tomosynthesis was performed. The images were evaluated with computer-aided detection. COMPARISON:  Previous exam(s). ACR Breast Density Category b: There are scattered areas of fibroglandular density. FINDINGS: There are no findings suspicious for malignancy. IMPRESSION: No mammographic evidence of malignancy. A result letter of this screening mammogram will be mailed directly to the patient. RECOMMENDATION: Screening mammogram in one year. (Code:SM-B-01Y) BI-RADS CATEGORY  1: Negative. Electronically Signed   By: Debby Satterfield M.D.   On: 06/12/2024 08:29       Assessment & Plan:  Routine general medical examination at a health care facility  Primary hypertension -     Basic  metabolic panel with GFR; Future  Hypercholesteremia Assessment & Plan: Intolerant to statin medication and repatha . Continue low cholesterol diet and  exercise. On zetia . Discussed possibly adding bempadoic acid. Follow.   Orders: -     Lipid panel; Future -     Hepatic function panel; Future -     TSH; Future  Hyperglycemia Assessment & Plan: Low carb diet and exercise.  Follow met b and A1c.  Lab Results  Component Value Date   HGBA1C 6.0 06/12/2024    Orders: -     Hemoglobin A1c; Future  Health care maintenance Assessment & Plan: Physical today 06/16/24.  Mammogram 06/12/24 - Birads I.  Colonoscopy 04/2021.  Appointment scheduled with GI 07/2024 - for f/u colonoscopy.    Subclavian steal syndrome Assessment & Plan: S/p revascularization.  Continue risk factor modification.  Has previously seen [facility].  Discussed f/u. States was told - released. Continue risk factor modification.    Stress Assessment & Plan: Overall she feels she is handling things relatively well. Follow.    Statin myopathy Assessment & Plan: Intolerance to statin medication and repatha . On zetia . Discussed adding bempadoic acid.    S/P TKR (total knee replacement) using cement, right Assessment & Plan: S/p PT. Doing well.    Nasal congestion Assessment & Plan: Some nasal congestion as outlined. Took sudafed. Hold sudafed. Saline nasal spray / steroid nasal spray. Follow.    History of colonic polyps Assessment & Plan: Colonoscopy 04/2021.  Recommended f/u in 3 years. Due this year. Order placed for referral. Scheduled for 07/2024.    Carotid artery disease, unspecified laterality, unspecified type Assessment & Plan: Previously worked up by vascular surgery.  Had screening - read as normal exam.  Described minimal thickening- CCA and bulb bilaterally.  Stated no further w/up warranted.  Continue zetia . Intolerant to repatha  and statin medication.    Other orders -     Losartan  Potassium; Take 2 tablets (100 mg total) by mouth daily.  Dispense: 180 tablet; Refill: 1     Allena Hamilton, MD "

## 2024-06-22 ENCOUNTER — Encounter: Payer: Self-pay | Admitting: Internal Medicine

## 2024-06-22 NOTE — Assessment & Plan Note (Signed)
 S/p revascularization.  Continue risk factor modification.  Has previously seen [facility].  Discussed f/u. States was told - released. Continue risk factor modification.

## 2024-06-22 NOTE — Assessment & Plan Note (Signed)
Overall she feels she is handling things relatively well.  Follow.  

## 2024-06-22 NOTE — Assessment & Plan Note (Signed)
 S/p PT  Doing well

## 2024-06-22 NOTE — Assessment & Plan Note (Signed)
 Intolerant to statin medication and repatha . Continue low cholesterol diet and exercise. On zetia . Discussed possibly adding bempadoic acid. Follow.

## 2024-06-22 NOTE — Assessment & Plan Note (Signed)
 Some nasal congestion as outlined. Took sudafed. Hold sudafed. Saline nasal spray / steroid nasal spray. Follow.

## 2024-06-22 NOTE — Assessment & Plan Note (Signed)
 Intolerance to statin medication and repatha . On zetia . Discussed adding bempadoic acid.

## 2024-06-22 NOTE — Assessment & Plan Note (Signed)
 Low carb diet and exercise.  Follow met b and A1c.  Lab Results  Component Value Date   HGBA1C 6.0 06/12/2024

## 2024-06-22 NOTE — Assessment & Plan Note (Signed)
 Colonoscopy 04/2021.  Recommended f/u in 3 years. Due this year. Order placed for referral. Scheduled for 07/2024.

## 2024-06-22 NOTE — Assessment & Plan Note (Signed)
 Previously worked up by vascular surgery.  Had screening - read as normal exam.  Described minimal thickening- CCA and bulb bilaterally.  Stated no further w/up warranted.  Continue zetia . Intolerant to repatha  and statin medication.

## 2024-08-19 ENCOUNTER — Ambulatory Visit: Admitting: Internal Medicine

## 2025-04-27 ENCOUNTER — Ambulatory Visit
